# Patient Record
Sex: Female | Born: 1995 | Race: Black or African American | Hispanic: No | Marital: Single | Smoking: Never smoker
Health system: Southern US, Community
[De-identification: ages and names within clinical notes are randomized; demographics above are authoritative.]

## PROBLEM LIST (undated history)

## (undated) DIAGNOSIS — Z789 Other specified health status: Secondary | ICD-10-CM

## (undated) DIAGNOSIS — E119 Type 2 diabetes mellitus without complications: Secondary | ICD-10-CM

## (undated) HISTORY — PX: OTHER SURGICAL HISTORY: SHX169

## (undated) HISTORY — PX: NO PAST SURGERIES: SHX2092

---
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
## 2017-07-10 DIAGNOSIS — L03311 Cellulitis of abdominal wall: Secondary | ICD-10-CM | POA: Insufficient documentation

## 2017-07-10 DIAGNOSIS — L981 Factitial dermatitis: Secondary | ICD-10-CM | POA: Insufficient documentation

## 2017-07-10 NOTE — ED Triage Notes (Signed)
Pt states she developed a small wound to left lower leg and left abd last week that is not healing. States areas do itch and unsure of insect bite.

## 2017-07-11 ENCOUNTER — Encounter: Payer: Self-pay | Admitting: Emergency Medicine

## 2017-07-11 ENCOUNTER — Emergency Department
Admission: EM | Admit: 2017-07-11 | Discharge: 2017-07-11 | Disposition: A | Discharge status: Home / Self Care | Payer: Self-pay | Attending: Emergency Medicine | Admitting: Emergency Medicine

## 2017-07-11 DIAGNOSIS — L03311 Cellulitis of abdominal wall: Secondary | ICD-10-CM

## 2017-07-11 DIAGNOSIS — F424 Excoriation (skin-picking) disorder: Secondary | ICD-10-CM

## 2017-07-11 MED ORDER — CEPHALEXIN 500 MG PO CAPS: 500.0000 mg | ORAL_CAPSULE | Freq: Four times a day (QID) | ORAL | 0 refills | Status: AC

## 2017-07-11 MED ORDER — CEPHALEXIN 500 MG PO CAPS
500.0000 mg | ORAL_CAPSULE | Freq: Once | ORAL | Status: AC
  Administered 2017-07-11: 500 mg via ORAL
  Filled 2017-07-11: qty 1

## 2017-07-11 MED ORDER — BACITRACIN ZINC 500 UNIT/GM EX OINT
TOPICAL_OINTMENT | CUTANEOUS | Status: AC
  Filled 2017-07-11: qty 0.9

## 2017-07-11 MED ORDER — BACITRACIN ZINC 500 UNIT/GM EX OINT: TOPICAL_OINTMENT | CUTANEOUS | 0 refills | Status: AC

## 2017-07-11 MED ORDER — BACITRACIN ZINC 500 UNIT/GM EX OINT
TOPICAL_OINTMENT | Freq: Two times a day (BID) | CUTANEOUS | Status: DC
  Administered 2017-07-11: 1 via TOPICAL

## 2017-07-11 NOTE — ED Provider Notes (Signed)
[facility] Emergency Department Provider Note   ____________________________________________   First MD Initiated Contact with Patient 07/11/17 0345     (approximate)  I have reviewed the triage vital signs and the nursing notes.   HISTORY  Chief Complaint Wound Check    HPI Dominique Sanders is a 21 y.o. female who comes into the hospital today with a wound to her left lower abdomen and her left lower leg. The patient had this since last Tuesday approximately 8 days ago. The patient started itching her leg and she actually scratched these wounds into her leg and abdomen. The patient is unsure exactly when it became worse but noticed that when she was scratching either area she felt some fluid. The patient states that her uncle told her look like it was getting worse in color and worse in size. It was initially small but has gotten bigger. The patient has been putting peroxide on this area daily and then putting Band-Aids on it daily. The patient denies any fevers. She reports that sometimes she feels hot when it is cold but has not had any fevers. It's painful when he touches but she has no pain otherwise. The patient is here today for evaluation of this area. The patient also reports that she is unsure if she has any medical problems and she wants us to check her for medical problems.   History reviewed. No pertinent past medical history.  There are no active problems to display for this patient.   History reviewed. No pertinent surgical history.  Prior to Admission medications   Medication Sig Start Date End Date Taking? Authorizing Provider  bacitracin ointment Apply to affected area daily 07/11/17 07/11/18  Rebecka ApleyWebster, Carrianne Hyun P, MD  cephALEXin (KEFLEX) 500 MG capsule Take 1 capsule (500 mg total) by mouth 4 (four) times daily. 07/11/17 07/21/17  Rebecka ApleyWebster, Kanylah Muench P, MD    Allergies Patient has no known allergies.  No family history on file.  Social  History Social History  Substance Use Topics  . Smoking status: Never Smoker  . Smokeless tobacco: Not on file  . Alcohol use Yes    Review of Systems  Constitutional: No fever/chills Eyes: No visual changes. ENT: No sore throat. Cardiovascular: Denies chest pain. Respiratory: Denies shortness of breath. Gastrointestinal: No abdominal pain.  No nausea, no vomiting.  No diarrhea.  No constipation. Genitourinary: Negative for dysuria. Musculoskeletal: Negative for back pain. Skin: Excoriated area to left shin and left abdominal wall Neurological: Negative for headaches, focal weakness or numbness.   ____________________________________________   PHYSICAL EXAM:  VITAL SIGNS: ED Triage Vitals  Enc Vitals Group     BP 07/10/17 2347 138/78     Pulse Rate 07/10/17 2347 (!) 108     Resp 07/10/17 2347 20     Temp 07/10/17 2347 98.2 F (36.8 C)     Temp Source 07/10/17 2347 Oral     SpO2 07/10/17 2347 100 %     Weight 07/10/17 2351 214 lb (97.1 kg)     Height 07/10/17 2347 5\' 2"  (1.575 m)     Head Circumference --      Peak Flow --      Pain Score 07/10/17 2347 8     Pain Loc --      Pain Edu? --      Excl. in GC? --     Constitutional: Alert and oriented. Well appearing and in Mild distress. Eyes: Conjunctivae are normal. PERRL. EOMI. Head: Atraumatic.  Nose: No congestion/rhinnorhea. Mouth/Throat: Mucous membranes are moist.  Oropharynx non-erythematous. Cardiovascular: Normal rate, regular rhythm. Grossly normal heart sounds.  Good peripheral circulation. Respiratory: Normal respiratory effort.  No retractions. Lungs CTAB. Gastrointestinal: Soft and nontender. No distention. Positive bowel sounds Musculoskeletal: No lower extremity tenderness nor edema.   Neurologic:  Normal speech and language.  Skin:  Skin is warm, dry lesion noted to left shin approximately 5 cm in length and 1 cm in width, no active draining but with some yellow crusting in the middle of the  lesion, lesion also noted to the patient's left abdominal wall with some erythema surrounding again with some crusting in the middle with no active drainage. It is approximately 2-3 cm in length. Psychiatric: Mood and affect are normal.   ____________________________________________   LABS (all labs ordered are listed, but only abnormal results are displayed)  Labs Reviewed - No data to display ____________________________________________  EKG  none ____________________________________________  RADIOLOGY  No results found.  ____________________________________________   PROCEDURES  Procedure(s) performed: None  Procedures  Critical Care performed: No  ____________________________________________   INITIAL IMPRESSION / ASSESSMENT AND PLAN / ED COURSE  Pertinent labs & imaging results that were available during my care of the patient were reviewed by me and considered in my medical decision making (see chart for details).  This is a 22 year old female who comes into the hospital today with 2 wounds to her leg and abdominal wall. The patient scratched these areas and they have been getting bigger and bigger. The patient has been using peroxide daily which can be cytotoxic and that can be causing the worsening of her wound. As there is an area of erythema and warmth I will give the patient dose of Keflex. I will also place some bacitracin on the wound and wrapped them. I informed the patient and she should just placed some bacitracin on the wounds and let them. She is to change the dressing approximately every 2 days. I also informed the patient that I cannot check her for routine illnesses. If she wants a physical exam she needs to [facility] or with the acute care clinic for further evaluation. Otherwise the patient has no further concerns or complaints. Her vital signs are unremarkable. She'll be discharged home to follow-up.       ____________________________________________   FINAL CLINICAL IMPRESSION(S) / ED DIAGNOSES  Final diagnoses:  Cellulitis of abdominal wall  Excoriation (skin-picking) disorder      NEW MEDICATIONS STARTED DURING THIS VISIT:  Discharge Medication List as of 07/11/2017  4:21 AM    START taking these medications   Details  bacitracin ointment Apply to affected area daily, Print    cephALEXin (KEFLEX) 500 MG capsule Take 1 capsule (500 mg total) by mouth 4 (four) times daily., Starting Thu 07/11/2017, Until Sun 07/21/2017, Print         Note:  This document was prepared using Dragon voice recognition software and may include unintentional dictation errors.    Rebecka Apley, MD 07/11/17 [phone]

## 2017-07-16 ENCOUNTER — Emergency Department (HOSPITAL_COMMUNITY): Payer: Self-pay

## 2017-07-16 ENCOUNTER — Emergency Department: Payer: No Typology Code available for payment source

## 2017-07-16 ENCOUNTER — Encounter: Payer: Self-pay | Admitting: *Deleted

## 2017-07-16 DIAGNOSIS — Y939 Activity, unspecified: Secondary | ICD-10-CM | POA: Diagnosis not present

## 2017-07-16 DIAGNOSIS — Y998 Other external cause status: Secondary | ICD-10-CM | POA: Diagnosis not present

## 2017-07-16 DIAGNOSIS — Y929 Unspecified place or not applicable: Secondary | ICD-10-CM | POA: Diagnosis not present

## 2017-07-16 DIAGNOSIS — R55 Syncope and collapse: Secondary | ICD-10-CM | POA: Diagnosis not present

## 2017-07-16 DIAGNOSIS — Z043 Encounter for examination and observation following other accident: Secondary | ICD-10-CM | POA: Insufficient documentation

## 2017-07-16 NOTE — ED Notes (Signed)
Spoke to Dr. Manson PasseyBrown, verbal order for head CT and C spine.
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
## 2017-07-17 ENCOUNTER — Emergency Department
Admission: EM | Admit: 2017-07-17 | Discharge: 2017-07-17 | Disposition: A | Discharge status: Home / Self Care | Payer: No Typology Code available for payment source | Attending: Emergency Medicine | Admitting: Emergency Medicine

## 2017-07-17 ENCOUNTER — Encounter: Payer: Self-pay | Admitting: Emergency Medicine
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
## 2018-11-06 ENCOUNTER — Emergency Department
Admission: EM | Admit: 2018-11-06 | Discharge: 2018-11-06 | Disposition: A | Discharge status: Home / Self Care | Payer: Self-pay | Attending: Emergency Medicine | Admitting: Emergency Medicine

## 2018-11-06 ENCOUNTER — Emergency Department: Payer: Self-pay

## 2018-11-06 ENCOUNTER — Encounter: Payer: Self-pay | Admitting: Emergency Medicine

## 2018-11-06 DIAGNOSIS — M79641 Pain in right hand: Secondary | ICD-10-CM | POA: Insufficient documentation

## 2018-11-06 DIAGNOSIS — R700 Elevated erythrocyte sedimentation rate: Secondary | ICD-10-CM | POA: Insufficient documentation

## 2018-11-06 LAB — SEDIMENTATION RATE: Sed Rate: 22 mm/hr — ABNORMAL HIGH (ref 0–20)

## 2018-11-06 LAB — URIC ACID: URIC ACID, SERUM: 6.5 mg/dL (ref 2.5–7.1)

## 2018-11-06 MED ORDER — MELOXICAM 15 MG PO TABS: 15.0000 mg | ORAL_TABLET | Freq: Every day | ORAL | 2 refills | Status: DC

## 2018-11-06 NOTE — ED Triage Notes (Signed)
Patient presents to the ED with intermittent right hand swelling and pain x 1 week.  Patient denies any known injury to right hand.  Patient placed compression band on hand yesterday and states this has increased her pain.  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 Patient is right handed.  No obvious distress at this time.

## 2018-11-06 NOTE — Discharge Instructions (Addendum)
Follow-up with your regular doctor or the [facility].  You will need additional labs to define whether you have a rheumatoid arthritis type illness.  Apply ice to the hand as needed.  Take the medication as prescribed.

## 2018-11-06 NOTE — ED Provider Notes (Signed)
[facility] Emergency Department Provider Note  ____________________________________________   First MD Initiated Contact with Patient 11/06/18 1715     (approximate)  I have reviewed the triage vital signs and the nursing notes.   HISTORY  Chief Complaint Hand Pain    HPI Dominique Sanders is a 22 y.o. female presents emergency department complaining of right hand swelling intermittently.  Symptoms on and off for 1 week.  She denies any injury to the right hand.  She put a compression on the hand yesterday which increased pain.  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  She denies any numbness or tingling.  She denies any IV drug use.    History reviewed. No pertinent past medical history.  There are no active problems to display for this patient.   History reviewed. No pertinent surgical history.  Prior to Admission medications   Medication Sig Start Date End Date Taking? Authorizing Provider  meloxicam (MOBIC) 15 MG tablet Take 1 tablet (15 mg total) by mouth daily. 11/06/18   Faythe Ghee, PA-C    Allergies Patient has no known allergies.  No family history on file.  Social History Social History   Tobacco Use  . Smoking status: Never Smoker  . Smokeless tobacco: Never Used  Substance Use Topics  . Alcohol use: Yes  . Drug use: No    Review of Systems  Constitutional: No fever/chills Eyes: No visual changes. ENT: No sore throat. Respiratory: Denies cough Genitourinary: Negative for dysuria. Musculoskeletal: Negative for back pain.  Positive for right hand swelling Skin: Negative for rash.    ____________________________________________   PHYSICAL EXAM:  VITAL SIGNS: ED Triage Vitals [11/06/18 1627]  Enc Vitals Group     BP 105/76     Pulse Rate 87     Resp 18     Temp 98.7 F (37.1 C)     Temp Source Oral     SpO2 98 %     Weight 223 lb 1.7 oz (101.2 kg)     Height 5'  2" (1.575 m)     Head Circumference      Peak Flow      Pain Score 7     Pain Loc      Pain Edu?      Excl. in GC?     Constitutional: Alert and oriented. Well appearing and in no acute distress. Eyes: Conjunctivae are normal.  Head: Atraumatic. Nose: No congestion/rhinnorhea. Mouth/Throat: Mucous membranes are moist.   Neck:  supple no lymphadenopathy noted Cardiovascular: Normal rate, regular rhythm. Heart sounds are normal Respiratory: Normal respiratory effort.  No retractions, lungs c t a  GU: deferred Musculoskeletal: FROM all extremities, warm and well perfused.  Right hand appears to be swollen across the metacarpals and the fingers appear to be minimally swollen.  Neurovascular is intact.  No redness is noted. Neurologic:  Normal speech and language.  Skin:  Skin is warm, dry and intact. No rash noted. Psychiatric: Mood and affect are normal. Speech and behavior are normal.  ____________________________________________   LABS (all labs ordered are listed, but only abnormal results are displayed)  Labs Reviewed  SEDIMENTATION RATE - Abnormal; Notable for the following components:      Result Value   Sed Rate 22 (*)    All other components within normal limits  URIC ACID   ____________________________________________   ____________________________________________  RADIOLOGY  X-ray of the right hand  is negative  ____________________________________________   PROCEDURES  Procedure(s) performed: No  Procedures    ____________________________________________   INITIAL IMPRESSION / ASSESSMENT AND PLAN / ED COURSE  Pertinent labs & imaging results that were available during my care of the patient were reviewed by me and considered in my medical decision making (see chart for details).   Patient is a 22 year old female presents emergency department complaining of right hand swelling.  She states it is been ongoing for 1 week intermittently.  She denies  any injury to the hand.  She denies any IV drug use.  On physical exam the hand is slightly tender with some puffiness and swelling noted over the metacarpals and into the fingers.  Neurovascular is intact.  No redness or track marks are noted.  Sed rate is elevated, uric acid is negative.  X-ray of the right hand is negative.  The patient was notified of the results.  She was given a prescription for meloxicam.  She is to follow-up with either her regular doctor or the [facility] for additional testing to define if it is rheumatoid arthritis or not.  She states she understands will comply.  She was discharged in stable condition.  As part of my medical decision making, I reviewed the following data within the electronic MEDICAL RECORD NUMBER Nursing notes reviewed and incorporated, Labs reviewed uric acid negative, sed rate elevated, Old chart reviewed, Radiograph reviewed x-ray of the right hand is negative, Notes from prior ED visits and [place] Controlled Substance Database  ____________________________________________   FINAL CLINICAL IMPRESSION(S) / ED DIAGNOSES  Final diagnoses:  Right hand pain  Elevated sed rate      NEW MEDICATIONS STARTED DURING THIS VISIT:  New Prescriptions   MELOXICAM (MOBIC) 15 MG TABLET    Take 1 tablet (15 mg total) by mouth daily.     Note:  This document was prepared using Dragon voice recognition software and may include unintentional dictation errors.    Faythe GheeFisher,  W, PA-C 11/06/18 2007    Pershing ProudSchaevitz, Myra Rudeavid Matthew, MD 11/06/18 [phone]

## 2019-07-30 ENCOUNTER — Other Ambulatory Visit: Payer: Self-pay

## 2019-07-30 ENCOUNTER — Ambulatory Visit (INDEPENDENT_AMBULATORY_CARE_PROVIDER_SITE_OTHER): Payer: Medicaid Other | Admitting: Certified Nurse Midwife

## 2019-07-30 VITALS — BP 96/68 | HR 82 | Ht 62.0 in | Wt 262.0 lb

## 2019-07-30 DIAGNOSIS — Z3490 Encounter for supervision of normal pregnancy, unspecified, unspecified trimester: Secondary | ICD-10-CM | POA: Diagnosis not present

## 2019-07-30 DIAGNOSIS — Z0283 Encounter for blood-alcohol and blood-drug test: Secondary | ICD-10-CM

## 2019-07-30 DIAGNOSIS — R638 Other symptoms and signs concerning food and fluid intake: Secondary | ICD-10-CM

## 2019-07-30 DIAGNOSIS — Z113 Encounter for screening for infections with a predominantly sexual mode of transmission: Secondary | ICD-10-CM

## 2019-07-30 LAB — POCT URINE PREGNANCY: Preg Test, Ur: POSITIVE — AB

## 2019-07-30 MED ORDER — PROMETHAZINE HCL 25 MG PO TABS: 25.0000 mg | ORAL_TABLET | Freq: Four times a day (QID) | ORAL | 6 refills | Status: DC | PRN

## 2019-07-30 MED ORDER — CITRANATAL BLOOM 90-1 MG PO TABS: 1.0000 mg | ORAL_TABLET | Freq: Every day | ORAL | 12 refills | Status: DC

## 2019-07-30 NOTE — Patient Instructions (Signed)
WHAT OB PATIENTS CAN EXPECT   Confirmation of pregnancy and ultrasound ordered if medically indicated-[redacted] weeks gestation  New OB (NOB) intake with nurse and New OB (NOB) labs- [redacted] weeks gestation  New OB (NOB) physical examination with provider- 11/[redacted] weeks gestation  Flu vaccine-[redacted] weeks gestation  Anatomy scan-[redacted] weeks gestation  Glucose tolerance test, blood work to test for anemia, T-dap vaccine-[redacted] weeks gestation  Vaginal swabs/cultures-STD/Group B strep-[redacted] weeks gestation  Appointments every 4 weeks until 28 weeks  Every 2 weeks from 28 weeks until 36 weeks  Weekly visits from 36 weeks until delivery  Prenatal Care Prenatal care is health care during pregnancy. It helps you and your unborn baby (fetus) stay as healthy as possible. Prenatal care may be provided by a midwife, a family practice health care provider, or a childbirth and pregnancy specialist (obstetrician). How does this affect me? During pregnancy, you will be closely monitored for any new conditions that might develop. To lower your risk of pregnancy complications, you and your health care provider will talk about any underlying conditions you have. How does this affect my baby? Early and consistent prenatal care increases the chance that your baby will be healthy during pregnancy. Prenatal care lowers the risk that your baby will be:  Born early (prematurely).  Smaller than expected at birth (small for gestational age). What can I expect at the first prenatal care visit? Your first prenatal care visit will likely be the longest. You should schedule your first prenatal care visit as soon as you know that you are pregnant. Your first visit is a good time to talk about any questions or concerns you have about pregnancy. At your visit, you and your health care provider will talk about:  Your medical history, including: ? Any past pregnancies. ? Your family's medical history. ? The baby's father's medical  history. ? Any long-term (chronic) health conditions you have and how you manage them. ? Any surgeries or procedures you have had. ? Any current over-the-counter or prescription medicines, herbs, or supplements you are taking.  Other factors that could pose a risk to your baby, including:  Your home setting and your stress levels, including: ? Exposure to abuse or violence. ? Household financial strain. ? Mental health conditions you have.  Your daily health habits, including diet and exercise. Your health care provider will also:  Measure your weight, height, and blood pressure.  Do a physical exam, including a pelvic and breast exam.  Perform blood tests and urine tests to check for: ? Urinary tract infection. ? Sexually transmitted infections (STIs). ? Low iron levels in your blood (anemia). ? Blood type and certain proteins on red blood cells (Rh antibodies). ? Infections and immunity to viruses, such as hepatitis B and rubella. ? HIV (human immunodeficiency virus).  Do an ultrasound to confirm your baby's growth and development and to help predict your estimated due date (EDD). This ultrasound is done with a probe that is inserted into the vagina (transvaginal ultrasound).  Discuss your options for genetic screening.  Give you information about how to keep yourself and your baby healthy, including: ? Nutrition and taking vitamins. ? Physical activity. ? How to manage pregnancy symptoms such as nausea and vomiting (morning sickness). ? Infections and substances that may be harmful to your baby and how to avoid them. ? Food safety. ? Dental care. ? Working. ? Travel. ? Warning signs to watch for and when to call your health care provider. How often will  I have prenatal care visits? After your first prenatal care visit, you will have regular visits throughout your pregnancy. The visit schedule is often as follows:  Up to week 28 of pregnancy: once every 4 weeks.  28-36  weeks: once every 2 weeks.  After 36 weeks: every week until delivery. Some women may have visits more or less often depending on any underlying health conditions and the health of the baby. Keep all follow-up and prenatal care visits as told by your health care provider. This is important. What happens during routine prenatal care visits? Your health care provider will:  Measure your weight and blood pressure.  Check for fetal heart sounds.  Measure the height of your uterus in your abdomen (fundal height). This may be measured starting around week 20 of pregnancy.  Check the position of your baby inside your uterus.  Ask questions about your diet, sleeping patterns, and whether you can feel the baby move.  Review warning signs to watch for and signs of labor.  Ask about any pregnancy symptoms you are having and how you are dealing with them. Symptoms may include: ? Headaches. ? Nausea and vomiting. ? Vaginal discharge. ? Swelling. ? Fatigue. ? Constipation. ? Any discomfort, including back or pelvic pain. Make a list of questions to ask your health care provider at your routine visits. What tests might I have during prenatal care visits? You may have blood, urine, and imaging tests throughout your pregnancy, such as:  Urine tests to check for glucose, protein, or signs of infection.  Glucose tests to check for a form of diabetes that can develop during pregnancy (gestational diabetes mellitus). This is usually done around week 24 of pregnancy.  An ultrasound to check your baby's growth and development and to check for birth defects. This is usually done around week 20 of pregnancy.  A test to check for group B strep (GBS) infection. This is usually done around week 36 of pregnancy.  Genetic testing. This may include blood or imaging tests, such as an ultrasound. Some genetic tests are done during the first trimester and some are done during the second trimester. What else  can I expect during prenatal care visits? Your health care provider may recommend getting certain vaccines during pregnancy. These may include:  A yearly flu shot (annual influenza vaccine). This is especially important if you will be pregnant during flu season.  Tdap (tetanus, diphtheria, pertussis) vaccine. Getting this vaccine during pregnancy can protect your baby from whooping cough (pertussis) after birth. This vaccine may be recommended between weeks 27 and 36 of pregnancy. Later in your pregnancy, your health care provider may give you information about:  Childbirth and breastfeeding classes.  Choosing a health care provider for your baby.  Umbilical cord banking.  Breastfeeding.  Birth control after your baby is born.  The hospital labor and delivery unit and how to tour it.  Registering at the hospital before you go into labor. Where to find more information  Office on Women's Health: LegalWarrants.gl  American Pregnancy Association: americanpregnancy.org  March of Dimes: marchofdimes.org Summary  Prenatal care helps you and your baby stay as healthy as possible during pregnancy.  Your first prenatal care visit will most likely be the longest.  You will have visits and tests throughout your pregnancy to monitor your health and your baby's health.  Bring a list of questions to your visits to ask your health care provider.  Make sure to keep all follow-up and prenatal  care visits with your health care provider. This information is not intended to replace advice given to you by your health care provider. Make sure you discuss any questions you have with your health care provider. Document Released: 12/13/2003 Document Revised: 04/01/2019 Document Reviewed: 12/09/2017 Elsevier Patient Education  2020 [employer]. How a Baby Grows During Pregnancy  Pregnancy begins when a female's sperm enters a female's egg (fertilization). Fertilization usually happens in one of the  tubes (fallopian tubes) that connect the ovaries to the womb (uterus). The fertilized egg moves down the fallopian tube to the uterus. Once it reaches the uterus, it implants into the lining of the uterus and begins to grow. For the first 10 weeks, the fertilized egg is called an embryo. After 10 weeks, it is called a fetus. As the fetus continues to grow, it receives oxygen and nutrients through tissue (placenta) that grows to support the developing baby. The placenta is the life support system for the baby. It provides oxygen and nutrition and removes waste. Learning as much as you can about your pregnancy and how your baby is developing can help you enjoy the experience. It can also make you aware of when there might be a problem and when to ask questions. How long does a typical pregnancy last? A pregnancy usually lasts 280 days, or about 40 weeks. Pregnancy is divided into three periods of growth, also called trimesters:  First trimester: 0-12 weeks.  Second trimester: 13-27 weeks.  Third trimester: 28-40 weeks. The day when your baby is ready to be born (full term) is your estimated date of delivery. How does my baby develop month by month? First month  The fertilized egg attaches to the inside of the uterus.  Some cells will form the placenta. Others will form the fetus.  The arms, legs, brain, spinal cord, lungs, and heart begin to develop.  At the end of the first month, the heart begins to beat. Second month  The bones, inner ear, eyelids, hands, and feet form.  The genitals develop.  By the end of 8 weeks, all major organs are developing. Third month  All of the internal organs are forming.  Teeth develop below the gums.  Bones and muscles begin to grow. The spine can flex.  The skin is transparent.  Fingernails and toenails begin to form.  Arms and legs continue to grow longer, and hands and feet develop.  The fetus is about 3 inches (7.6 cm) long. Fourth  month  The placenta is completely formed.  The external sex organs, neck, outer ear, eyebrows, eyelids, and fingernails are formed.  The fetus can hear, swallow, and move its arms and legs.  The kidneys begin to produce urine.  The skin is covered with a white, waxy coating (vernix) and very fine hair (lanugo). Fifth month  The fetus moves around more and can be felt for the first time (quickening).  The fetus starts to sleep and wake up and may begin to suck its finger.  The nails grow to the end of the fingers.  The organ in the digestive system that makes bile (gallbladder) functions and helps to digest nutrients.  If your baby is a girl, eggs are present in her ovaries. If your baby is a boy, testicles start to move down into his scrotum. Sixth month  The lungs are formed.  The eyes open. The brain continues to develop.  Your baby has fingerprints and toe prints. Your baby's hair grows thicker.  At the end of the second trimester, the fetus is about 9 inches (22.9 cm) long. Seventh month  The fetus kicks and stretches.  The eyes are developed enough to sense changes in light.  The hands can make a grasping motion.  The fetus responds to sound. Eighth month  All organs and body systems are fully developed and functioning.  Bones harden, and taste buds develop. The fetus may hiccup.  Certain areas of the brain are still developing. The skull remains soft. Ninth month  The fetus gains about  lb (0.23 kg) each week.  The lungs are fully developed.  Patterns of sleep develop.  The fetus's head typically moves into a head-down position (vertex) in the uterus to prepare for birth.  The fetus weighs 6-9 lb (2.72-4.08 kg) and is 19-20 inches (48.26-50.8 cm) long. What can I do to have a healthy pregnancy and help my baby develop? General instructions  Take prenatal vitamins as directed by your health care provider. These include vitamins such as folic acid,  iron, calcium, and vitamin D. They are important for healthy development.  Take medicines only as directed by your health care provider. Read labels and ask a pharmacist or your health care provider whether over-the-counter medicines, supplements, and prescription drugs are safe to take during pregnancy.  Keep all follow-up visits as directed by your health care provider. This is important. Follow-up visits include prenatal care and screening tests. How do I know if my baby is developing well? At each prenatal visit, your health care provider will do several different tests to check on your health and keep track of your baby's development. These include:  Fundal height and position. ? Your health care provider will measure your growing belly from your pubic bone to the top of the uterus using a tape measure. ? Your health care provider will also feel your belly to determine your baby's position.  Heartbeat. ? An ultrasound in the first trimester can confirm pregnancy and show a heartbeat, depending on how far along you are. ? Your health care provider will check your baby's heart rate at every prenatal visit.  Second trimester ultrasound. ? This ultrasound checks your baby's development. It also may show your baby's gender. What should I do if I have concerns about my baby's development? Always talk with your health care provider about any concerns that you may have about your pregnancy and your baby. Summary  A pregnancy usually lasts 280 days, or about 40 weeks. Pregnancy is divided into three periods of growth, also called trimesters.  Your health care provider will monitor your baby's growth and development throughout your pregnancy.  Follow your health care provider's recommendations about taking prenatal vitamins and medicines during your pregnancy.  Talk with your health care provider if you have any concerns about your pregnancy or your developing baby. This information is not  intended to replace advice given to you by your health care provider. Make sure you discuss any questions you have with your health care provider. Document Released: 05/28/2008 Document Revised: 04/02/2019 Document Reviewed: 10/23/2017 Elsevier Patient Education  [place]. Morning Sickness  Morning sickness is when you feel sick to your stomach (nauseous) during pregnancy. You may feel sick to your stomach and throw up (vomit). You may feel sick in the morning, but you can feel this way at any time of day. Some women feel very sick to their stomach and cannot stop throwing up (hyperemesis gravidarum). Follow these instructions at home:  Medicines  Take over-the-counter and prescription medicines only as told by your doctor. Do not take any medicines until you talk with your doctor about them first.  Taking multivitamins before getting pregnant can stop or lessen the harshness of morning sickness. Eating and drinking  Eat dry toast or crackers before getting out of bed.  Eat 5 or 6 small meals a day.  Eat dry and bland foods like rice and baked potatoes.  Do not eat greasy, fatty, or spicy foods.  Have someone cook for you if the smell of food causes you to feel sick or throw up.  If you feel sick to your stomach after taking prenatal vitamins, take them at night or with a snack.  Eat protein when you need a snack. Nuts, yogurt, and cheese are good choices.  Drink fluids throughout the day.  Try ginger ale made with real ginger, ginger tea made from fresh grated ginger, or ginger candies. General instructions  Do not use any products that have nicotine or tobacco in them, such as cigarettes and e-cigarettes. If you need help quitting, ask your doctor.  Use an air purifier to keep the air in your house free of smells.  Get lots of fresh air.  Try to avoid smells that make you feel sick.  Try: ? Wearing a bracelet that is used for seasickness (acupressure  wristband). ? Going to a doctor who puts thin needles into certain body points (acupuncture) to improve how you feel. Contact a doctor if:  You need medicine to feel better.  You feel dizzy or light-headed.  You are losing weight. Get help right away if:  You feel very sick to your stomach and cannot stop throwing up.  You pass out (faint).  You have very bad pain in your belly. Summary  Morning sickness is when you feel sick to your stomach (nauseous) during pregnancy.  You may feel sick in the morning, but you can feel this way at any time of day.  Making some changes to what you eat may help your symptoms go away. This information is not intended to replace advice given to you by your health care provider. Make sure you discuss any questions you have with your health care provider. Document Released: 01/17/2005 Document Revised: 11/22/2017 Document Reviewed: 01/10/2017 Elsevier Patient Education  2020 [place] of Pregnancy  The first trimester of pregnancy is from week 1 until the end of week 13 (months 1 through 3). During this time, your baby will begin to develop inside you. At 6-8 weeks, the eyes and face are formed, and the heartbeat can be seen on ultrasound. At the end of 12 weeks, all the baby's organs are formed. Prenatal care is all the medical care you receive before the birth of your baby. Make sure you get good prenatal care and follow all of your doctor's instructions. Follow these instructions at home: Medicines  Take over-the-counter and prescription medicines only as told by your doctor. Some medicines are safe and some medicines are not safe during pregnancy.  Take a prenatal vitamin that contains at least 600 micrograms (mcg) of folic acid.  If you have trouble pooping (constipation), take medicine that will make your stool soft (stool softener) if your doctor approves. Eating and drinking   Eat regular, healthy meals.  Your doctor  will tell you the amount of weight gain that is right for you.  Avoid raw meat and uncooked cheese.  If you feel sick to  your stomach (nauseous) or throw up (vomit): ? Eat 4 or 5 small meals a day instead of 3 large meals. ? Try eating a few soda crackers. ? Drink liquids between meals instead of during meals.  To prevent constipation: ? Eat foods that are high in fiber, like fresh fruits and vegetables, whole grains, and beans. ? Drink enough fluids to keep your pee (urine) clear or pale yellow. Activity  Exercise only as told by your doctor. Stop exercising if you have cramps or pain in your lower belly (abdomen) or low back.  Do not exercise if it is too hot, too humid, or if you are in a place of great height (high altitude).  Try to avoid standing for long periods of time. Move your legs often if you must stand in one place for a long time.  Avoid heavy lifting.  Wear low-heeled shoes. Sit and stand up straight.  You can have sex unless your doctor tells you not to. Relieving pain and discomfort  Wear a good support bra if your breasts are sore.  Take warm water baths (sitz baths) to soothe pain or discomfort caused by hemorrhoids. Use hemorrhoid cream if your doctor says it is okay.  Rest with your legs raised if you have leg cramps or low back pain.  If you have puffy, bulging veins (varicose veins) in your legs: ? Wear support hose or compression stockings as told by your doctor. ? Raise (elevate) your feet for 15 minutes, 3-4 times a day. ? Limit salt in your food. Prenatal care  Schedule your prenatal visits by the twelfth week of pregnancy.  Write down your questions. Take them to your prenatal visits.  Keep all your prenatal visits as told by your doctor. This is important. Safety  Wear your seat belt at all times when driving.  Make a list of emergency phone numbers. The list should include numbers for family, friends, the hospital, and police and fire  departments. General instructions  Ask your doctor for a referral to a local prenatal class. Begin classes no later than at the start of month 6 of your pregnancy.  Ask for help if you need counseling or if you need help with nutrition. Your doctor can give you advice or tell you where to go for help.  Do not use hot tubs, steam rooms, or saunas.  Do not douche or use tampons or scented sanitary pads.  Do not cross your legs for long periods of time.  Avoid all herbs and alcohol. Avoid drugs that are not approved by your doctor.  Do not use any tobacco products, including cigarettes, chewing tobacco, and electronic cigarettes. If you need help quitting, ask your doctor. You may get counseling or other support to help you quit.  Avoid cat litter boxes and soil used by cats. These carry germs that can cause birth defects in the baby and can cause a loss of your baby (miscarriage) or stillbirth.  Visit your dentist. At home, brush your teeth with a soft toothbrush. Be gentle when you floss. Contact a doctor if:  You are dizzy.  You have mild cramps or pressure in your lower belly.  You have a nagging pain in your belly area.  You continue to feel sick to your stomach, you throw up, or you have watery poop (diarrhea).  You have a bad smelling fluid coming from your vagina.  You have pain when you pee (urinate).  You have increased puffiness (swelling) in your  face, hands, legs, or ankles. Get help right away if:  You have a fever.  You are leaking fluid from your vagina.  You have spotting or bleeding from your vagina.  You have very bad belly cramping or pain.  You gain or lose weight rapidly.  You throw up blood. It may look like coffee grounds.  You are around people who have [place] measles, fifth disease, or chickenpox.  You have a very bad headache.  You have shortness of breath.  You have any kind of trauma, such as from a fall or a car  accident. Summary  The first trimester of pregnancy is from week 1 until the end of week 13 (months 1 through 3).  To take care of yourself and your unborn baby, you will need to eat healthy meals, take medicines only if your doctor tells you to do so, and do activities that are safe for you and your baby.  Keep all follow-up visits as told by your doctor. This is important as your doctor will have to ensure that your baby is healthy and growing well. This information is not intended to replace advice given to you by your health care provider. Make sure you discuss any questions you have with your health care provider. Document Released: 05/28/2008 Document Revised: 04/02/2019 Document Reviewed: 12/18/2016 Elsevier Patient Education  2020 [employer]. Commonly Asked Questions During Pregnancy  Cats: A parasite can be excreted in cat feces.  To avoid exposure you need to have another person empty the little box.  If you must empty the litter box you will need to wear gloves.  Wash your hands after handling your cat.  This parasite can also be found in raw or undercooked meat so this should also be avoided.  Colds, Sore Throats, Flu: Please check your medication sheet to see what you can take for symptoms.  If your symptoms are unrelieved by these medications please call the office.  Dental Work: Most any dental work [employer] recommends is permitted.  X-rays should only be taken during the first trimester if absolutely necessary.  Your abdomen should be shielded with a lead apron during all x-rays.  Please notify your provider prior to receiving any x-rays.  Novocaine is fine; gas is not recommended.  If your dentist requires a note from [place] prior to dental work please call the office and we will provide one for you.  Exercise: Exercise is an important part of staying healthy during your pregnancy.  You may continue most exercises you were accustomed to prior to pregnancy.  Later in your  pregnancy you will most likely notice you have difficulty with activities requiring balance like riding a bicycle.  It is important that you listen to your body and avoid activities that put you at a higher risk of falling.  Adequate rest and staying well hydrated are a must!  If you have questions about the safety of specific activities ask your provider.    Exposure to Children with illness: Try to avoid obvious exposure; report any symptoms to [place] when noted,  If you have chicken pos, red measles or mumps, you should be immune to these diseases.   Please do not take any vaccines while pregnant unless you have checked with your OB provider.  Fetal Movement: After 28 weeks we recommend you do "kick counts" twice daily.  Lie or sit down in a calm quiet environment and count your baby movements "kicks".  You should feel your baby  should feel your baby at least 10 times per hour.  If you have not felt 10 kicks within the first hour get up, walk around and have something sweet to eat or drink then repeat for an additional hour.  If count remains less than 10 per hour notify your provider.  Fumigating: Follow your pest control agent's advice as to how long to stay out of your home.  Ventilate the area well before re-entering.  Hemorrhoids:   Most over-the-counter preparations can be used during pregnancy.  Check your medication to see what is safe to use.  It is important to use a stool softener or fiber in your diet and to drink lots of liquids.  If hemorrhoids seem to be getting worse please call the office.   Hot Tubs:  Hot tubs Jacuzzis and saunas are not recommended while pregnant.  These increase your internal body temperature and should be avoided.  Intercourse:  Sexual intercourse is safe during pregnancy as long as you are comfortable, unless otherwise advised by your provider.  Spotting may occur after intercourse; report any bright red bleeding that is heavier than spotting.  Labor:  If you know that you are in labor,  please go to the hospital.  If you are unsure, please call the office and let us help you decide what to do.  Lifting, straining, etc:  If your job requires heavy lifting or straining please check with your provider for any limitations.  Generally, you should not lift items heavier than that you can lift simply with your hands and arms (no back muscles)  Painting:  Paint fumes do not harm your pregnancy, but may make you ill and should be avoided if possible.  Latex or water based paints have less odor than oils.  Use adequate ventilation while painting.  Permanents & Hair Color:  Chemicals in hair dyes are not recommended as they cause increase hair dryness which can increase hair loss during pregnancy.  " Highlighting" and permanents are allowed.  Dye may be absorbed differently and permanents may not hold as well during pregnancy.  Sunbathing:  Use a sunscreen, as skin burns easily during pregnancy.  Drink plenty of fluids; avoid over heating.  Tanning Beds:  Because their possible side effects are still unknown, tanning beds are not recommended.  Ultrasound Scans:  Routine ultrasounds are performed at approximately 20 weeks.  You will be able to see your baby's general anatomy an if you would like to know the gender this can usually be determined as well.  If it is questionable when you conceived you may also receive an ultrasound early in your pregnancy for dating purposes.  Otherwise ultrasound exams are not routinely performed unless there is a medical necessity.  Although you can request a scan we ask that you pay for it when conducted because insurance does not cover " patient request" scans.  Work: If your pregnancy proceeds without complications you may work until your due date, unless your physician or employer advises otherwise.  Round Ligament Pain/Pelvic Discomfort:  Sharp, shooting pains not associated with bleeding are fairly common, usually occurring in the second trimester of  pregnancy.  They tend to be worse when standing up or when you remain standing for long periods of time.  These are the result of pressure of certain pelvic ligaments called "round ligaments".  Rest, Tylenol and heat seem to be the most effective relief.  As the womb and fetus grow, they rise out of the pelvis   Please notify the office if your pain seems different than that described.  It may represent a more serious condition.  Common Medications Safe in Pregnancy  Acne:      Constipation:  Benzoyl Peroxide     Colace  Clindamycin      Dulcolax Suppository  Topica Erythromycin     Fibercon  Salicylic Acid      Metamucil         Miralax AVOID:        Senakot   Accutane    Cough:  Retin-A       Cough Drops  Tetracycline      Phenergan w/ Codeine if Rx  Minocycline      Robitussin (Plain & DM)  Antibiotics:     Crabs/Lice:  Ceclor       RID  Cephalosporins    AVOID:  E-Mycins      Kwell  Keflex  Macrobid/Macrodantin   Diarrhea:  Penicillin      Kao-Pectate  Zithromax      Imodium AD         PUSH FLUIDS AVOID:       Cipro     Fever:  Tetracycline      Tylenol (Regular or Extra  Minocycline       Strength)  Levaquin      Extra Strength-Do not          Exceed 8 tabs/24 hrs Caffeine:        <258m/day (equiv. To 1 cup of coffee or  approx. 3 12 oz sodas)         Gas: Cold/Hayfever:       Gas-X  Benadryl      Mylicon  Claritin       Phazyme  **Claritin-D        Chlor-Trimeton    Headaches:  Dimetapp      ASA-Free Excedrin  Drixoral-Non-Drowsy     Cold Compress  Mucinex (Guaifenasin)     Tylenol (Regular or Extra  Sudafed/Sudafed-12 Hour     Strength)  **Sudafed PE Pseudoephedrine   Tylenol Cold & Sinus     Vicks Vapor Rub  Zyrtec  **AVOID if Problems With Blood Pressure         Heartburn: Avoid lying down for at least 1 hour after meals  Aciphex      Maalox     Rash:  Milk of Magnesia     Benadryl    Mylanta       1% Hydrocortisone  Cream  Pepcid  Pepcid Complete   Sleep Aids:  Prevacid      Ambien   Prilosec       Benadryl  Rolaids       Chamomile Tea  Tums (Limit 4/day)     Unisom  Zantac       Tylenol PM         Warm milk-add vanilla or  Hemorrhoids:       Sugar for taste  Anusol/Anusol H.C.  (RX: Analapram 2.5%)  Sugar Substitutes:  Hydrocortisone OTC     Ok in moderation  Preparation H      Tucks        Vaseline lotion applied to tissue with wiping    Herpes:     Throat:  Acyclovir      Oragel  Famvir  Valtrex     Vaccines:         Flu Shot Leg Cramps:       *Gardasil  Benadryl      Hepatitis A         Hepatitis B Nasal Spray:       Pneumovax  Saline Nasal Spray     Polio Booster         Tetanus Nausea:       Tuberculosis test or PPD  Vitamin B6 25 mg TID   AVOID:    Dramamine      *Gardasil  Emetrol       Live Poliovirus  Ginger Root 250 mg QID    MMR (measles, mumps &  High Complex Carbs @ Bedtime    rebella)  Sea Bands-Accupressure    Varicella (Chickenpox)  Unisom 1/2 tab TID     *No known complications           If received before Pain:         Known pregnancy;   Darvocet       Resume series after  Lortab        Delivery  Percocet    Yeast:   Tramadol      Femstat  Tylenol 3      Gyne-lotrimin  Ultram       Monistat  Vicodin           MISC:         All Sunscreens           Hair Coloring/highlights          Insect Repellant's          (Including DEET)         Mystic Tans

## 2019-07-30 NOTE — Progress Notes (Signed)
      Dominique Sanders presents for NOB nurse intake visit. Pregnancy confirmation done at [place], with who unknown.  G1.  P0.  LMP unknown.  EDD 02/16/20.  Ga [redacted]w[redacted]d. Pregnancy education material explained and given.  0 cats in the home.  NOB labs ordered. BMI greater than 30. TSH/HbgA1c ordered. Sickle cell order due to race. HIV and drug screen explained and ordered. Genetic screening discussed. Genetic testing; Unsure. Pt to discuss genetic testing with provider. PNV encouraged. Pt to follow up with provider in 1 weeks for NOB physical.    Did UPT in office results positive.  U/S ordered today and completed. Pt is  [redacted]w[redacted]d. EDD 02/16/20  NOB labs not completed due to no lab tech in office. All NOB labs pended.

## 2019-08-04 ENCOUNTER — Encounter: Payer: Self-pay | Admitting: Certified Nurse Midwife

## 2019-08-11 ENCOUNTER — Encounter: Payer: Self-pay | Admitting: Certified Nurse Midwife

## 2019-08-11 ENCOUNTER — Other Ambulatory Visit (HOSPITAL_COMMUNITY)
Admission: RE | Admit: 2019-08-11 | Discharge: 2019-08-11 | Disposition: A | Discharge status: Home / Self Care | Payer: BLUE CROSS/BLUE SHIELD | Source: Ambulatory Visit | Attending: Certified Nurse Midwife | Admitting: Certified Nurse Midwife

## 2019-08-11 ENCOUNTER — Other Ambulatory Visit: Payer: Self-pay

## 2019-08-11 ENCOUNTER — Ambulatory Visit (INDEPENDENT_AMBULATORY_CARE_PROVIDER_SITE_OTHER): Payer: Medicaid Other | Admitting: Certified Nurse Midwife

## 2019-08-11 VITALS — BP 94/64 | HR 90 | Wt 265.3 lb

## 2019-08-11 DIAGNOSIS — O99210 Obesity complicating pregnancy, unspecified trimester: Secondary | ICD-10-CM | POA: Insufficient documentation

## 2019-08-11 DIAGNOSIS — O99213 Obesity complicating pregnancy, third trimester: Secondary | ICD-10-CM | POA: Insufficient documentation

## 2019-08-11 DIAGNOSIS — O26891 Other specified pregnancy related conditions, first trimester: Secondary | ICD-10-CM

## 2019-08-11 DIAGNOSIS — Z3492 Encounter for supervision of normal pregnancy, unspecified, second trimester: Secondary | ICD-10-CM

## 2019-08-11 DIAGNOSIS — N898 Other specified noninflammatory disorders of vagina: Secondary | ICD-10-CM

## 2019-08-11 DIAGNOSIS — Z3A13 13 weeks gestation of pregnancy: Secondary | ICD-10-CM

## 2019-08-11 DIAGNOSIS — Z6841 Body Mass Index (BMI) 40.0 and over, adult: Secondary | ICD-10-CM | POA: Insufficient documentation

## 2019-08-11 DIAGNOSIS — O99211 Obesity complicating pregnancy, first trimester: Secondary | ICD-10-CM

## 2019-08-11 DIAGNOSIS — Z0283 Encounter for blood-alcohol and blood-drug test: Secondary | ICD-10-CM

## 2019-08-11 DIAGNOSIS — R638 Other symptoms and signs concerning food and fluid intake: Secondary | ICD-10-CM

## 2019-08-11 DIAGNOSIS — Z124 Encounter for screening for malignant neoplasm of cervix: Secondary | ICD-10-CM | POA: Diagnosis present

## 2019-08-11 DIAGNOSIS — Z113 Encounter for screening for infections with a predominantly sexual mode of transmission: Secondary | ICD-10-CM

## 2019-08-11 DIAGNOSIS — Z130 Encounter for screening for diseases of the blood and blood-forming organs and certain disorders involving the immune mechanism: Secondary | ICD-10-CM

## 2019-08-11 LAB — POCT URINALYSIS DIPSTICK OB
Bilirubin, UA: NEGATIVE
Glucose, UA: NEGATIVE
Ketones, UA: NEGATIVE
Nitrite, UA: NEGATIVE
Spec Grav, UA: 1.015 (ref 1.010–1.025)
Urobilinogen, UA: 0.2 E.U./dL
pH, UA: 6.5 (ref 5.0–8.0)

## 2019-08-11 MED ORDER — TRIAMCINOLONE ACETONIDE 0.1 % EX CREA: 1.0000 "application " | TOPICAL_CREAM | Freq: Two times a day (BID) | CUTANEOUS | 0 refills | Status: DC

## 2019-08-11 NOTE — Progress Notes (Signed)
Error

## 2019-08-11 NOTE — Progress Notes (Signed)
Patient here for NOB physical, no complaints.  

## 2019-08-11 NOTE — Progress Notes (Signed)
cNEW OB HISTORY AND PHYSICAL  SUBJECTIVE:       Dominique Sanders is a 23 y.o. G1P0 female, Patient's last menstrual period was 11/06/2018 (lmp unknown)., Estimated Date of Delivery: 02/16/20, [id number], presents today for establishment of Prenatal Care.  She has no unusual complaints and complains of breast tenderness, skin changes, and intermittent constipation.    Desires genetic screening; wants Mom to receive results.    Denies difficulty breathing or respiratory distress, chest pain, abdominal pain, vaginal bleeding, dysuria, and leg pain or swelling.    Gynecologic History  Patient's last menstrual period was 11/06/2018 (lmp unknown).  Period Cycle (Days): 28 Period Duration (Days): 5 Period Pattern: Regular Menstrual Flow: Heavy, Light Menstrual Control: Tampon Dysmenorrhea: (!) Severe Dysmenorrhea Symptoms: Cramping  Contraception: none  Last Pap: due.   Obstetric History  OB History  Gravida Para Term Preterm AB Living  1            SAB TAB Ectopic Multiple Live Births               # Outcome Date GA Lbr Len/2nd Weight Sex Delivery Anes PTL Lv  1 Current             History reviewed. No pertinent past medical history.  Past Surgical History:  Procedure Laterality Date  . no surgical       Current Outpatient Medications on File Prior to Visit  Medication Sig Dispense Refill  . Prenatal-DSS-FeCb-FeGl-FA (CITRANATAL BLOOM) 90-1 MG TABS Take 1 mg by mouth daily. 30 tablet 12  . promethazine (PHENERGAN) 25 MG tablet Take 1 tablet (25 mg total) by mouth every 6 (six) hours as needed for nausea or vomiting. 30 tablet 6   No current facility-administered medications on file prior to visit.     No Known Allergies  Social History   Socioeconomic History  . Marital status: Single    Spouse name: Not on file  . Number of children: Not on file  . Years of education: Not on file  . Highest education level: Not on file  Occupational History  . Not on file  Social  Needs  . Financial resource strain: Not on file  . Food insecurity    Worry: Not on file    Inability: Not on file  . Transportation needs    Medical: Not on file    Non-medical: Not on file  Tobacco Use  . Smoking status: Never Smoker  . Smokeless tobacco: Never Used  Substance and Sexual Activity  . Alcohol use: Not Currently  . Drug use: No  . Sexual activity: Yes    Birth control/protection: None  Lifestyle  . Physical activity    Days per week: Not on file    Minutes per session: Not on file  . Stress: Not on file  Relationships  . Social Musicianconnections    Talks on phone: Not on file    Gets together: Not on file    Attends religious service: Not on file    Active member of club or organization: Not on file    Attends meetings of clubs or organizations: Not on file    Relationship status: Not on file  . Intimate partner violence    Fear of current or ex partner: Not on file    Emotionally abused: Not on file    Physically abused: Not on file    Forced sexual activity: Not on file  Other Topics Concern  . Not on file  Social  History Narrative  . Not on file    Family History  Problem Relation Age of Onset  . Healthy Mother     The following portions of the patient's history were reviewed and updated as appropriate: allergies, current medications, past OB history, past medical history, past surgical history, past family history, past social history, and problem list.  Review of Systems:  ROS negative except as noted above. Information obtained from patient.   OBJECTIVE:  BP 94/64   Pulse 90   Wt 265 lb 4.8 oz (120.3 kg)   LMP 11/06/2018 (LMP Unknown)   BMI 48.52 kg/m   Initial Physical Exam (New OB)  GENERAL APPEARANCE: alert, well appearing, in no apparent distress  HEAD: normocephalic, atraumatic  MOUTH: mucous membranes moist, pharynx normal without lesions  THYROID: no thyromegaly or masses present  BREASTS: no masses noted, no significant  tenderness, no palpable axillary nodes, diffuse dry patches present  LUNGS: clear to auscultation, no wheezes, rales or rhonchi, symmetric air entry  HEART: regular rate and rhythm, no murmurs  ABDOMEN: soft, nontender, nondistended, no abnormal masses, no epigastric pain, obese and FHT present  EXTREMITIES: no redness or tenderness in the calves or thighs, no edema  SKIN: normal coloration and turgor, no rashes, diffuse dry patches present  LYMPH NODES: no adenopathy palpable  NEUROLOGIC: alert, oriented, normal speech, no focal findings or movement disorder noted  PELVIC EXAM EXTERNAL GENITALIA: normal appearing vulva with no masses, tenderness or lesions VAGINA: no abnormal discharge or lesions CERVIX: no lesions or cervical motion tenderness and Pap collected  ASSESSMENT: Normal pregnancy Obesity in pregnancy, BMI 35 Desires genetic screening, Panorama ordered today Screening for cervical cancer, Pap collected-see orders  PLAN: Prenatal care New OB counseling: The patient has been given an overview regarding routine prenatal care. Recommendations regarding diet, weight gain, and exercise in pregnancy were given. Prenatal testing, optional genetic testing, and ultrasound use in pregnancy were reviewed.  Benefits of Breast Feeding were discussed. The patient is encouraged to consider nursing her baby post partum. See orders   Diona Fanti, CNM [facility], [facility] 08/11/19 5:55 PM

## 2019-08-11 NOTE — Patient Instructions (Signed)
Vaginal Delivery  Vaginal delivery means that you give birth by pushing your baby out of your birth canal (vagina). A team of health care providers will help you before, during, and after vaginal delivery. Birth experiences are unique for every woman and every pregnancy, and birth experiences vary depending on where you choose to give birth. What happens when I arrive at the birth center or hospital? Once you are in labor and have been admitted into the hospital or birth center, your health care provider may:  Review your pregnancy history and any concerns that you have.  Insert an IV into one of your veins. This may be used to give you fluids and medicines.  Check your blood pressure, pulse, temperature, and heart rate (vital signs).  Check whether your bag of water (amniotic sac) has broken (ruptured).  Talk with you about your birth plan and discuss pain control options. Monitoring Your health care provider may monitor your contractions (uterine monitoring) and your baby's heart rate (fetal monitoring). You may need to be monitored:  Often, but not continuously (intermittently).  All the time or for long periods at a time (continuously). Continuous monitoring may be needed if: ? You are taking certain medicines, such as medicine to relieve pain or make your contractions stronger. ? You have pregnancy or labor complications. Monitoring may be done by:  Placing a special stethoscope or a handheld monitoring device on your abdomen to check your baby's heartbeat and to check for contractions.  Placing monitors on your abdomen (external monitors) to record your baby's heartbeat and the frequency and length of contractions.  Placing monitors inside your uterus through your vagina (internal monitors) to record your baby's heartbeat and the frequency, length, and strength of your contractions. Depending on the type of monitor, it may remain in your uterus or on your baby's head until  birth.  Telemetry. This is a type of continuous monitoring that can be done with external or internal monitors. Instead of having to stay in bed, you are able to move around during telemetry. Physical exam Your health care provider may perform frequent physical exams. This may include:  Checking how and where your baby is positioned in your uterus.  Checking your cervix to determine: ? Whether it is thinning out (effacing). ? Whether it is opening up (dilating). What happens during labor and delivery?  Normal labor and delivery is divided into the following three stages: Stage 1  This is the longest stage of labor.  This stage can last for hours or days.  Throughout this stage, you will feel contractions. Contractions generally feel mild, infrequent, and irregular at first. They get stronger, more frequent (about every 2-3 minutes), and more regular as you move through this stage.  This stage ends when your cervix is completely dilated to 4 inches (10 cm) and completely effaced. Stage 2  This stage starts once your cervix is completely effaced and dilated and lasts until the delivery of your baby.  This stage may last from 20 minutes to 2 hours.  This is the stage where you will feel an urge to push your baby out of your vagina.  You may feel stretching and burning pain, especially when the widest part of your baby's head passes through the vaginal opening (crowning).  Once your baby is delivered, the umbilical cord will be clamped and cut. This usually occurs after waiting a period of 1-2 minutes after delivery.  Your baby will be placed on your bare chest (  skin-to-skin contact) in an upright position and covered with a warm blanket. Watch your baby for feeding cues, like rooting or sucking, and help the baby to your breast for his or her first feeding. Stage 3  This stage starts immediately after the birth of your baby and ends after you deliver the placenta.  This stage may  take anywhere from 5 to 30 minutes.  After your baby has been delivered, you will feel contractions as your body expels the placenta and your uterus contracts to control bleeding. What can I expect after labor and delivery?  After labor is over, you and your baby will be monitored closely until you are ready to go home to ensure that you are both healthy. Your health care team will teach you how to care for yourself and your baby.  You and your baby will stay in the same room (rooming in) during your hospital stay. This will encourage early bonding and successful breastfeeding.  You may continue to receive fluids and medicines through an IV.  Your uterus will be checked and massaged regularly (fundal massage).  You will have some soreness and pain in your abdomen, vagina, and the area of skin between your vaginal opening and your anus (perineum).  If an incision was made near your vagina (episiotomy) or if you had some vaginal tearing during delivery, cold compresses may be placed on your episiotomy or your tear. This helps to reduce pain and swelling.  You may be given a squirt bottle to use instead of wiping when you go to the bathroom. To use the squirt bottle, follow these steps: ? Before you urinate, fill the squirt bottle with warm water. Do not use hot water. ? After you urinate, while you are sitting on the toilet, use the squirt bottle to rinse the area around your urethra and vaginal opening. This rinses away any urine and blood. ? Fill the squirt bottle with clean water every time you use the bathroom.  It is normal to have vaginal bleeding after delivery. Wear a sanitary pad for vaginal bleeding and discharge. Summary  Vaginal delivery means that you will give birth by pushing your baby out of your birth canal (vagina).  Your health care provider may monitor your contractions (uterine monitoring) and your baby's heart rate (fetal monitoring).  Your health care provider may  perform a physical exam.  Normal labor and delivery is divided into three stages.  After labor is over, you and your baby will be monitored closely until you are ready to go home. This information is not intended to replace advice given to you by your health care provider. Make sure you discuss any questions you have with your health care provider. Document Released: 09/18/2008 Document Revised: 01/14/2018 Document Reviewed: 01/14/2018 Elsevier Patient Education  [place]. Pain Relief During Labor and Delivery Many things can cause pain during labor and delivery, including:  Pressure on bones and ligaments due to the baby moving through the pelvis.  Stretching of tissues due to the baby moving through the birth canal.  Muscle tension due to anxiety or nervousness.  The uterus tightening (contracting) and relaxing to help move the baby. There are many ways to deal with the pain of labor and delivery. They include:  Taking prenatal classes. Taking these classes helps you know what to expect during your babys birth. What you learn will increase your confidence and decrease your anxiety.  Practicing relaxation techniques or doing relaxing activities, such as: ?  Focused breathing. ? Meditation. ? Visualization. ? Aroma therapy. ? Listening to your favorite music. ? Hypnosis.  Taking a warm shower or bath (hydrotherapy). This may: ? Provide comfort and relaxation. ? Lessen your perception of pain. ? Decrease the amount of pain medicine needed. ? Decrease the length of labor.  Getting a massage or counterpressure on your back.  Applying warm packs or ice packs.  Changing positions often, moving around, or using a birthing ball.  Getting: ? Pain medicine through an IV or injection into a muscle. ? Pain medicine inserted into your spinal column. ? Injections of sterile water just under the skin on your lower back (intradermal injections). ? Laughing gas (nitrous  oxide). Discuss your pain control options with your health care provider during your prenatal visits. Explore the options offered by your hospital or birth center. What kinds of medicine are available? There are two kinds of medicines that can be used to relieve pain during labor and delivery:  Analgesics. These medicines decrease pain without causing you to lose feeling or the ability to move your muscles.  Anesthetics. These medicines block feeling in the body and can decrease your ability to move freely. Both of these kinds of medicine can cause minor side effects, such as nausea, trouble concentrating, and sleepiness. They can also decrease the baby's heart rate before birth and affect the babys breathing rate after birth. For this reason, health care providers are careful about when and how much medicine is given. What are specific medicines and procedures that provide pain relief? Local Anesthetics Local anesthetics are used to numb a small area of the body. They may be used along with another kind of anesthetic or used to numb the nerves of the vagina, cervix, and perineum during the second stage of labor. General Anesthetics General anesthetics cause you to lose consciousness so you do not feel pain. They are usually only used for an emergency cesarean delivery. General anesthetics are given through an IV tube and a mask. Pudendal Block A pudendal block is a form of local anesthetic. It may be used to relieve the pain associated with pushing or stretching of the perineum at the time of delivery or to further numb the perineum. A pudendal block is done by injecting numbing medicine through the vaginal wall into a nerve in the pelvis. Epidural Analgesia Epidural analgesia is given through a flexible IV catheter that is inserted into the lower back. Numbing medicine is delivered continuously to the area near your spinal column nerves (epidural space). After having this type of analgesia, you  may be able to move your legs but you most likely will not be able to walk. Depending on the amount of medicine given, you may lose all feeling in the lower half of your body, or you may retain some level of sensation, including the urge to push. Epidural analgesia can be used to provide pain relief for a vaginal birth. Spinal Block A spinal block is similar to epidural analgesia, but the medicine is injected into the spinal fluid instead of the epidural space. A spinal block is only given once. It starts to relieve pain quickly, but the pain relief lasts only 1-6 hours. Spinal blocks can be used for cesarean deliveries. Combined Spinal-Epidural (CSE) Block A CSE block combines the effects of a spinal block and epidural analgesia. The spinal block works quickly to block all pain. The epidural analgesia provides continuous pain relief, even after the effects of the spinal block have worn  off. This information is not intended to replace advice given to you by your health care provider. Make sure you discuss any questions you have with your health care provider. Document Released: 03/28/2009 Document Revised: 11/22/2017 Document Reviewed: 05/02/2016 Elsevier Patient Education  2020 [employer].   [facility]  [place], [place], [place] [address]  Phone: [phone]   [facility] (second location)  [address] [place], [place] [address]  Phone: [phone]   [facility]) [place], [place], [place] [address] Phone: [phone]   [facility] [place], [place] [address]  Phone: [phone]  Breastfeeding  Choosing to breastfeed is one of the best decisions you can make for yourself and your baby. A change in hormones during pregnancy causes your breasts to make breast milk in your milk-producing glands. Hormones prevent breast milk from being released before your baby  is born. They also prompt milk flow after birth. Once breastfeeding has begun, thoughts of your baby, as well as his or her sucking or crying, can stimulate the release of milk from your milk-producing glands. Benefits of breastfeeding Research shows that breastfeeding offers many health benefits for infants and mothers. It also offers a cost-free and convenient way to feed your baby. For your baby  Your first milk (colostrum) helps your baby's digestive system to function better.  Special cells in your milk (antibodies) help your baby to fight off infections.  Breastfed babies are less likely to develop asthma, allergies, obesity, or type 2 diabetes. They are also at lower risk for sudden infant death syndrome (SIDS).  Nutrients in breast milk are better able to meet your babys needs compared to infant formula.  Breast milk improves your baby's brain development. For you  Breastfeeding helps to create a very special bond between you and your baby.  Breastfeeding is convenient. Breast milk costs nothing and is always available at the correct temperature.  Breastfeeding helps to burn calories. It helps you to lose the weight that you gained during pregnancy.  Breastfeeding makes your uterus return faster to its size before pregnancy. It also slows bleeding (lochia) after you give birth.  Breastfeeding helps to lower your risk of developing type 2 diabetes, osteoporosis, rheumatoid arthritis, cardiovascular disease, and breast, ovarian, uterine, and endometrial cancer later in life. Breastfeeding basics Starting breastfeeding  Find a comfortable place to sit or lie down, with your neck and back well-supported.  Place a pillow or a rolled-up blanket under your baby to bring him or her to the level of your breast (if you are seated). Nursing pillows are specially designed to help support your arms and your baby while you breastfeed.  Make sure that your baby's tummy (abdomen) is facing  your abdomen.  Gently massage your breast. With your fingertips, massage from the outer edges of your breast inward toward the nipple. This encourages milk flow. If your milk flows slowly, you may need to continue this action during the feeding.  Support your breast with 4 fingers underneath and your thumb above your nipple (make the letter "C" with your hand). Make sure your fingers are well away from your nipple and your babys mouth.  Stroke your baby's lips gently with your finger or nipple.  When your baby's mouth is open wide enough, quickly bring your baby to your breast, placing your entire nipple and as much of the areola as possible into your baby's mouth.  The areola is the colored area around your nipple. ? More areola should be visible above your baby's upper lip than below the lower lip. ? Your baby's lips should be opened and extended outward (flanged) to ensure an adequate, comfortable latch. ? Your baby's tongue should be between his or her lower gum and your breast.  Make sure that your baby's mouth is correctly positioned around your nipple (latched). Your baby's lips should create a seal on your breast and be turned out (everted).  It is common for your baby to suck about 2-3 minutes in order to start the flow of breast milk. Latching Teaching your baby how to latch onto your breast properly is very important. An improper latch can cause nipple pain, decreased milk supply, and poor weight gain in your baby. Also, if your baby is not latched onto your nipple properly, he or she may swallow some air during feeding. This can make your baby fussy. Burping your baby when you switch breasts during the feeding can help to get rid of the air. However, teaching your baby to latch on properly is still the best way to prevent fussiness from swallowing air while breastfeeding. Signs that your baby has successfully latched onto your nipple  Silent tugging or silent sucking, without causing  you pain. Infant's lips should be extended outward (flanged).  Swallowing heard between every 3-4 sucks once your milk has started to flow (after your let-down milk reflex occurs).  Muscle movement above and in front of his or her ears while sucking. Signs that your baby has not successfully latched onto your nipple  Sucking sounds or smacking sounds from your baby while breastfeeding.  Nipple pain. If you think your baby has not latched on correctly, slip your finger into the corner of your babys mouth to break the suction and place it between your baby's gums. Attempt to start breastfeeding again. Signs of successful breastfeeding Signs from your baby  Your baby will gradually decrease the number of sucks or will completely stop sucking.  Your baby will fall asleep.  Your baby's body will relax.  Your baby will retain a small amount of milk in his or her mouth.  Your baby will let go of your breast by himself or herself. Signs from you  Breasts that have increased in firmness, weight, and size 1-3 hours after feeding.  Breasts that are softer immediately after breastfeeding.  Increased milk volume, as well as a change in milk consistency and color by the fifth day of breastfeeding.  Nipples that are not sore, cracked, or bleeding. Signs that your baby is getting enough milk  Wetting at least 1-2 diapers during the first 24 hours after birth.  Wetting at least 5-6 diapers every 24 hours for the first week after birth. The urine should be clear or pale yellow by the age of 5 days.  Wetting 6-8 diapers every 24 hours as your baby continues to grow and develop.  At least 3 stools in a 24-hour period by the age of 5 days. The stool should be soft and yellow.  At least 3 stools in a 24-hour period by the age of 7 days. The stool should be seedy and yellow.  No loss of weight greater than 10% of birth weight during the first 3 days of life.  Average weight gain of 4-7 oz  (113-198 g) per week after the age of 4 days.  Consistent daily weight gain by the age of 5 days, without  weight loss after the age of 2 weeks. After a feeding, your baby may spit up a small amount of milk. This is normal. Breastfeeding frequency and duration Frequent feeding will help you make more milk and can prevent sore nipples and extremely full breasts (breast engorgement). Breastfeed when you feel the need to reduce the fullness of your breasts or when your baby shows signs of hunger. This is called "breastfeeding on demand." Signs that your baby is hungry include:  Increased alertness, activity, or restlessness.  Movement of the head from side to side.  Opening of the mouth when the corner of the mouth or cheek is stroked (rooting).  Increased sucking sounds, smacking lips, cooing, sighing, or squeaking.  Hand-to-mouth movements and sucking on fingers or hands.  Fussing or crying. Avoid introducing a pacifier to your baby in the first 4-6 weeks after your baby is born. After this time, you may choose to use a pacifier. Research has shown that pacifier use during the first year of a baby's life decreases the risk of sudden infant death syndrome (SIDS). Allow your baby to feed on each breast as long as he or she wants. When your baby unlatches or falls asleep while feeding from the first breast, offer the second breast. Because newborns are often sleepy in the first few weeks of life, you may need to awaken your baby to get him or her to feed. Breastfeeding times will vary from baby to baby. However, the following rules can serve as a guide to help you make sure that your baby is properly fed:  Newborns (babies 38 weeks of age or younger) may breastfeed every 1-3 hours.  Newborns should not go without breastfeeding for longer than 3 hours during the day or 5 hours during the night.  You should breastfeed your baby a minimum of 8 times in a 24-hour period. Breast milk pumping      Pumping and storing breast milk allows you to make sure that your baby is exclusively fed your breast milk, even at times when you are unable to breastfeed. This is especially important if you go back to work while you are still breastfeeding, or if you are not able to be present during feedings. Your lactation consultant can help you find a method of pumping that works best for you and give you guidelines about how long it is safe to store breast milk. Caring for your breasts while you breastfeed Nipples can become dry, cracked, and sore while breastfeeding. The following recommendations can help keep your breasts moisturized and healthy:  Avoid using soap on your nipples.  Wear a supportive bra designed especially for nursing. Avoid wearing underwire-style bras or extremely tight bras (sports bras).  Air-dry your nipples for 3-4 minutes after each feeding.  Use only cotton bra pads to absorb leaked breast milk. Leaking of breast milk between feedings is normal.  Use lanolin on your nipples after breastfeeding. Lanolin helps to maintain your skin's normal moisture barrier. Pure lanolin is not harmful (not toxic) to your baby. You may also hand express a few drops of breast milk and gently massage that milk into your nipples and allow the milk to air-dry. In the first few weeks after giving birth, some women experience breast engorgement. Engorgement can make your breasts feel heavy, warm, and tender to the touch. Engorgement peaks within 3-5 days after you give birth. The following recommendations can help to ease engorgement:  Completely empty your breasts while breastfeeding or pumping. You  may want to start by applying warm, moist heat (in the shower or with warm, water-soaked hand towels) just before feeding or pumping. This increases circulation and helps the milk flow. If your baby does not completely empty your breasts while breastfeeding, pump any extra milk after he or she is  finished.  Apply ice packs to your breasts immediately after breastfeeding or pumping, unless this is too uncomfortable for you. To do this: ? Put ice in a plastic bag. ? Place a towel between your skin and the bag. ? Leave the ice on for 20 minutes, 2-3 times a day.  Make sure that your baby is latched on and positioned properly while breastfeeding. If engorgement persists after 48 hours of following these recommendations, contact your health care provider or a Science writer. Overall health care recommendations while breastfeeding  Eat 3 healthy meals and 3 snacks every day. Well-nourished mothers who are breastfeeding need an additional 450-500 calories a day. You can meet this requirement by increasing the amount of a balanced diet that you eat.  Drink enough water to keep your urine pale yellow or clear.  Rest often, relax, and continue to take your prenatal vitamins to prevent fatigue, stress, and low vitamin and mineral levels in your body (nutrient deficiencies).  Do not use any products that contain nicotine or tobacco, such as cigarettes and e-cigarettes. Your baby may be harmed by chemicals from cigarettes that pass into breast milk and exposure to secondhand smoke. If you need help quitting, ask your health care provider.  Avoid alcohol.  Do not use illegal drugs or marijuana.  Talk with your health care provider before taking any medicines. These include over-the-counter and prescription medicines as well as vitamins and herbal supplements. Some medicines that may be harmful to your baby can pass through breast milk.  It is possible to become pregnant while breastfeeding. If birth control is desired, ask your health care provider about options that will be safe while breastfeeding your baby. Where to find more information: [employer]: www.llli.org Contact a health care provider if:  You feel like you want to stop breastfeeding or have become  frustrated with breastfeeding.  Your nipples are cracked or bleeding.  Your breasts are red, tender, or warm.  You have: ? Painful breasts or nipples. ? A swollen area on either breast. ? A fever or chills. ? Nausea or vomiting. ? Drainage other than breast milk from your nipples.  Your breasts do not become full before feedings by the fifth day after you give birth.  You feel sad and depressed.  Your baby is: ? Too sleepy to eat well. ? Having trouble sleeping. ? More than 39 week old and wetting fewer than 6 diapers in a 24-hour period. ? Not gaining weight by 66 days of age.  Your baby has fewer than 3 stools in a 24-hour period.  Your baby's skin or the white parts of his or her eyes become yellow. Get help right away if:  Your baby is overly tired (lethargic) and does not want to wake up and feed.  Your baby develops an unexplained fever. Summary  Breastfeeding offers many health benefits for infant and mothers.  Try to breastfeed your infant when he or she shows early signs of hunger.  Gently tickle or stroke your baby's lips with your finger or nipple to allow the baby to open his or her mouth. Bring the baby to your breast. Make sure that much of the  [place] is in your baby's mouth. Offer one side and burp the baby before you offer the other side.  Talk with your health care provider or lactation consultant if you have questions or you face problems as you breastfeed. This information is not intended to replace advice given to you by your health care provider. Make sure you discuss any questions you have with your health care provider. Document Released: 12/10/2005 Document Revised: 03/06/2018 Document Reviewed: 01/11/2017 Elsevier Patient Education  [place]. Round Ligament Pain  The round ligament is a cord of muscle and tissue that helps support the uterus. It can become a source of pain during pregnancy if it becomes stretched or twisted as the baby  grows. The pain usually begins in the second trimester (13-28 weeks) of pregnancy, and it can come and go until the baby is delivered. It is not a serious problem, and it does not cause harm to the baby. Round ligament pain is usually a short, sharp, and pinching pain, but it can also be a dull, lingering, and aching pain. The pain is felt in the lower side of the abdomen or in the groin. It usually starts deep in the groin and moves up to the outside of the hip area. The pain may occur when you:  Suddenly change position, such as quickly going from a sitting to standing position.  Roll over in bed.  Cough or sneeze.  Do physical activity. Follow these instructions at home:   Watch your condition for any changes.  When the pain starts, relax. Then try any of these methods to help with the pain: ? Sitting down. ? Flexing your knees up to your abdomen. ? Lying on your side with one pillow under your abdomen and another pillow between your legs. ? Sitting in a warm bath for 15-20 minutes or until the pain goes away.  Take over-the-counter and prescription medicines only as told by your health care provider.  Move slowly when you sit down or stand up.  Avoid long walks if they cause pain.  Stop or reduce your physical activities if they cause pain.  Keep all follow-up visits as told by your health care provider. This is important. Contact a health care provider if:  Your pain does not go away with treatment.  You feel pain in your back that you did not have before.  Your medicine is not helping. Get help right away if:  You have a fever or chills.  You develop uterine contractions.  You have vaginal bleeding.  You have nausea or vomiting.  You have diarrhea.  You have pain when you urinate. Summary  Round ligament pain is felt in the lower abdomen or groin. It is usually a short, sharp, and pinching pain. It can also be a dull, lingering, and aching pain.  This pain  usually begins in the second trimester (13-28 weeks). It occurs because the uterus is stretching with the growing baby, and it is not harmful to the baby.  You may notice the pain when you suddenly change position, when you cough or sneeze, or during physical activity.  Relaxing, flexing your knees to your abdomen, lying on one side, or taking a warm bath may help to get rid of the pain.  Get help from your health care provider if the pain does not go away or if you have vaginal bleeding, nausea, vomiting, diarrhea, or painful urination. This information is not intended to replace advice given to you by  your health care provider. Make sure you discuss any questions you have with your health care provider. Document Released: 09/18/2008 Document Revised: 05/28/2018 Document Reviewed: 05/28/2018 Elsevier Patient Education  [place]. Back Pain in Pregnancy Back pain during pregnancy is common. Back pain may be caused by several factors that are related to changes during your pregnancy. Follow these instructions at home: Managing pain, stiffness, and swelling      If directed, for sudden (acute) back pain, put ice on the painful area. ? Put ice in a plastic bag. ? Place a towel between your skin and the bag. ? Leave the ice on for 20 minutes, 2-3 times per day.  If directed, apply heat to the affected area before you exercise. Use the heat source that your health care provider recommends, such as a moist heat pack or a heating pad. ? Place a towel between your skin and the heat source. ? Leave the heat on for 20-30 minutes. ? Remove the heat if your skin turns bright red. This is especially important if you are unable to feel pain, heat, or cold. You may have a greater risk of getting burned.  If directed, massage the affected area. Activity  Exercise as told by your health care provider. Gentle exercise is the best way to prevent or manage back pain.  Listen to your body when  lifting. If lifting hurts, ask for help or bend your knees. This uses your leg muscles instead of your back muscles.  Squat down when picking up something from the floor. Do not bend over.  Only use bed rest for short periods as told by your health care provider. Bed rest should only be used for the most severe episodes of back pain. Standing, sitting, and lying down  Do not stand in one place for long periods of time.  Use good posture when sitting. Make sure your head rests over your shoulders and is not hanging forward. Use a pillow on your lower back if necessary.  Try sleeping on your side, preferably the left side, with a pregnancy support pillow or 1-2 regular pillows between your legs. ? If you have back pain after a night's rest, your bed may be too soft. ? A firm mattress may provide more support for your back during pregnancy. General instructions  Do not wear high heels.  Eat a healthy diet. Try to gain weight within your health care provider's recommendations.  Use a maternity girdle, elastic sling, or back brace as told by your health care provider.  Take over-the-counter and prescription medicines only as told by your health care provider.  Work with a physical therapist or massage therapist to find ways to manage back pain. Acupuncture or massage therapy may be helpful.  Keep all follow-up visits as told by your health care provider. This is important. Contact a health care provider if:  Your back pain interferes with your daily activities.  You have increasing pain in other parts of your body. Get help right away if:  You develop numbness, tingling, weakness, or problems with the use of your arms or legs.  You develop severe back pain that is not controlled with medicine.  You have a change in bowel or bladder control.  You develop shortness of breath, dizziness, or you faint.  You develop nausea, vomiting, or sweating.  You have back pain that is a  rhythmic, cramping pain similar to labor pains. Labor pain is usually 1-2 minutes apart, lasts for about  1 minute, and involves a bearing down feeling or pressure in your pelvis.  You have back pain and your water breaks or you have vaginal bleeding.  You have back pain or numbness that travels down your leg.  Your back pain developed after you fell.  You develop pain on one side of your back.  You see blood in your urine.  You develop skin blisters in the area of your back pain. Summary  Back pain may be caused by several factors that are related to changes during your pregnancy.  Follow instructions as told by your health care provider for managing pain, stiffness, and swelling.  Exercise as told by your health care provider. Gentle exercise is the best way to prevent or manage back pain.  Take over-the-counter and prescription medicines only as told by your health care provider.  Keep all follow-up visits as told by your health care provider. This is important. This information is not intended to replace advice given to you by your health care provider. Make sure you discuss any questions you have with your health care provider. Document Released: 03/20/2006 Document Revised: 03/31/2019 Document Reviewed: 05/28/2018 Elsevier Patient Education  [place]. WHAT OB PATIENTS CAN EXPECT   Confirmation of pregnancy and ultrasound ordered if medically indicated-[redacted] weeks gestation  New OB (NOB) intake with nurse and New OB (NOB) labs- [redacted] weeks gestation  New OB (NOB) physical examination with provider- 11/[redacted] weeks gestation  Flu vaccine-[redacted] weeks gestation  Anatomy scan-[redacted] weeks gestation  Glucose tolerance test, blood work to test for anemia, T-dap vaccine-[redacted] weeks gestation  Vaginal swabs/cultures-STD/Group B strep-[redacted] weeks gestation  Appointments every 4 weeks until 28 weeks  Every 2 weeks from 28 weeks until 36 weeks  Weekly visits from 36 weeks until  delivery  Eating Plan for Pregnant Women While you are pregnant, your body requires additional nutrition to help support your growing baby. You also have a higher need for some vitamins and minerals, such as folic acid, calcium, iron, and vitamin D. Eating a healthy, well-balanced diet is very important for your health and your baby's health. Your need for extra calories varies for the three 59-monthsegments of your pregnancy (trimesters). For most women, it is recommended to consume:  150 extra calories a day during the first trimester.  300 extra calories a day during the second trimester.  300 extra calories a day during the third trimester. What are tips for following this plan?   Do not try to lose weight or go on a diet during pregnancy.  Limit your overall intake of foods that have "empty calories." These are foods that have little nutritional value, such as sweets, desserts, candies, and sugar-sweetened beverages.  Eat a variety of foods (especially fruits and vegetables) to get a full range of vitamins and minerals.  Take a prenatal vitamin to help meet your additional vitamin and mineral needs during pregnancy, specifically for folic acid, iron, calcium, and vitamin D.  Remember to stay active. Ask your health care provider what types of exercise and activities are safe for you.  Practice good food safety and cleanliness. Wash your hands before you eat and after you prepare raw meat. Wash all fruits and vegetables well before peeling or eating. Taking these actions can help to prevent food-borne illnesses that can be very dangerous to your baby, such as listeriosis. Ask your health care provider for more information about listeriosis. What does 150 extra calories look like? Healthy options that provide 150  extra calories each day could be any of the following:  6-8 oz (170-230 g) of plain low-fat yogurt with  cup of berries.  1 apple with 2 teaspoons (11 g) of peanut  butter.  Cut-up vegetables with  cup (60 g) of hummus.  8 oz (230 mL) or 1 cup of low-fat chocolate milk.  1 stick of string cheese with 1 medium orange.  1 peanut butter and jelly sandwich that is made with one slice of whole-wheat bread and 1 tsp (5 g) of peanut butter. For 300 extra calories, you could eat two of those healthy options each day. What is a healthy amount of weight to gain? The right amount of weight gain for you is based on your BMI before you became pregnant. If your BMI:  Was less than 18 (underweight), you should gain 28-40 lb (13-18 kg).  Was 18-24.9 (normal), you should gain 25-35 lb (11-16 kg).  Was 25-29.9 (overweight), you should gain 15-25 lb (7-11 kg).  Was 30 or greater (obese), you should gain 11-20 lb (5-9 kg). What if I am having twins or multiples? Generally, if you are carrying twins or multiples:  You may need to eat 300-600 extra calories a day.  The recommended range for total weight gain is 25-54 lb (11-25 kg), depending on your BMI before pregnancy.  Talk with your health care provider to find out about nutritional needs, weight gain, and exercise that is right for you. What foods can I eat?  Grains All grains. Choose whole grains, such as whole-wheat bread, oatmeal, or brown rice. Vegetables All vegetables. Eat a variety of colors and types of vegetables. Remember to wash your vegetables well before peeling or eating. Fruits All fruits. Eat a variety of colors and types of fruit. Remember to wash your fruits well before peeling or eating. Meats and other protein foods Lean meats, including chicken, [place], fish, and lean cuts of beef, veal, or pork. If you eat fish or seafood, choose options that are higher in omega-3 fatty acids and lower in mercury, such as salmon, herring, mussels, trout, sardines, pollock, shrimp, crab, and lobster. Tofu. Tempeh. Beans. Eggs. Peanut butter and other nut butters. Make sure that all meats, poultry, and  eggs are cooked to food-safe temperatures or "well-done." Two or more servings of fish are recommended each week in order to get the most benefits from omega-3 fatty acids that are found in seafood. Choose fish that are lower in mercury. You can find more information online:  [employer] Dairy Pasteurized milk and milk alternatives (such as almond milk). Pasteurized yogurt and pasteurized cheese. Cottage cheese. Sour cream. Beverages Water. Juices that contain 100% fruit juice or vegetable juice. Caffeine-free teas and decaffeinated coffee. Drinks that contain caffeine are okay to drink, but it is better to avoid caffeine. Keep your total caffeine intake to less than 200 mg each day (which is 12 oz or 355 mL of coffee, tea, or soda) or the limit as told by your health care provider. Fats and oils Fats and oils are okay to include in moderation. Sweets and desserts Sweets and desserts are okay to include in moderation. Seasoning and other foods All pasteurized condiments. The items listed above may not be a complete list of recommended foods and beverages. Contact your dietitian for more options. The items listed above may not be a complete list of foods and beverages [you/your child] can eat. Contact a dietitian for more information. What foods are not recommended? Vegetables Raw (unpasteurized)  vegetable juices. Fruits Unpasteurized fruit juices. Meats and other protein foods Lunch meats, bologna, hot dogs, or other deli meats. (If you must eat those meats, reheat them until they are steaming hot.) Refrigerated pat, meat spreads from a meat counter, smoked seafood that is found in the refrigerated section of a store. Raw or undercooked meats, poultry, and eggs. Raw fish, such as sushi or sashimi. Fish that have high mercury content, such as tilefish, shark, swordfish, and king mackerel. To learn more about mercury in fish, talk with your health care provider or look for online resources,  such as:  [employer] Dairy Raw (unpasteurized) milk and any foods that have raw milk in them. Soft cheeses, such as feta, queso blanco, queso fresco, Brie, Camembert cheeses, blue-veined cheeses, and Panela cheese (unless it is made with pasteurized milk, which must be stated on the label). Beverages Alcohol. Sugar-sweetened beverages, such as sodas, teas, or energy drinks. Seasoning and other foods Homemade fermented foods and drinks, such as pickles, sauerkraut, or kombucha drinks. (Store-bought pasteurized versions of these are okay.) Salads that are made in a store or deli, such as ham salad, chicken salad, egg salad, tuna salad, and seafood salad. The items listed above may not be a complete list of foods and beverages to avoid. Contact your dietitian for more information. The items listed above may not be a complete list of foods and beverages [you/your child] should avoid. Contact a dietitian for more information. Where to find more information To calculate the number of calories you need based on your height, weight, and activity level, you can use an online calculator such as:  MobileTransition.ch To calculate how much weight you should gain during pregnancy, you can use an online pregnancy weight gain calculator such as:  StreamingFood.com.cy Summary  While you are pregnant, your body requires additional nutrition to help support your growing baby.  Eat a variety of foods, especially fruits and vegetables to get a full range of vitamins and minerals.  Practice good food safety and cleanliness. Wash your hands before you eat and after you prepare raw meat. Wash all fruits and vegetables well before peeling or eating. Taking these actions can help to prevent food-borne illnesses, such as listeriosis, that can be very dangerous to your baby.  Do not eat raw meat or fish. Do not eat fish that have high mercury content, such as  tilefish, shark, swordfish, and king mackerel. Do not eat unpasteurized (raw) dairy.  Take a prenatal vitamin to help meet your additional vitamin and mineral needs during pregnancy, specifically for folic acid, iron, calcium, and vitamin D. This information is not intended to replace advice given to you by your health care provider. Make sure you discuss any questions you have with your health care provider. Document Released: 09/24/2014 Document Revised: 04/02/2019 Document Reviewed: 09/06/2017 Elsevier Patient Education  [place]. Common Medications Safe in Pregnancy  Acne:      Constipation:  Benzoyl Peroxide     Colace  Clindamycin      Dulcolax Suppository  Topica Erythromycin     Fibercon  Salicylic Acid      Metamucil         Miralax AVOID:        Senakot   Accutane    Cough:  Retin-A       Cough Drops  Tetracycline      Phenergan w/ Codeine if Rx  Minocycline      Robitussin (Plain & DM)  Antibiotics:  Crabs/Lice:  Ceclor       RID  Cephalosporins    AVOID:  E-Mycins      Kwell  Keflex  Macrobid/Macrodantin   Diarrhea:  Penicillin      Kao-Pectate  Zithromax      Imodium AD         PUSH FLUIDS AVOID:       Cipro     Fever:  Tetracycline      Tylenol (Regular or Extra  Minocycline       Strength)  Levaquin      Extra Strength-Do not          Exceed 8 tabs/24 hrs Caffeine:        <271m/day (equiv. To 1 cup of coffee or  approx. 3 12 oz sodas)         Gas: Cold/Hayfever:       Gas-X  Benadryl      Mylicon  Claritin       Phazyme  **Claritin-D        Chlor-Trimeton    Headaches:  Dimetapp      ASA-Free Excedrin  Drixoral-Non-Drowsy     Cold Compress  Mucinex (Guaifenasin)     Tylenol (Regular or Extra  Sudafed/Sudafed-12 Hour     Strength)  **Sudafed PE Pseudoephedrine   Tylenol Cold & Sinus     Vicks Vapor Rub  Zyrtec  **AVOID if Problems With Blood Pressure         Heartburn: Avoid lying down for at least 1 hour after  meals  Aciphex      Maalox     Rash:  Milk of Magnesia     Benadryl    Mylanta       1% Hydrocortisone Cream  Pepcid  Pepcid Complete   Sleep Aids:  Prevacid      Ambien   Prilosec       Benadryl  Rolaids       Chamomile Tea  Tums (Limit 4/day)     Unisom  Zantac       Tylenol PM         Warm milk-add vanilla or  Hemorrhoids:       Sugar for taste  Anusol/Anusol H.C.  (RX: Analapram 2.5%)  Sugar Substitutes:  Hydrocortisone OTC     Ok in moderation  Preparation H      Tucks        Vaseline lotion applied to tissue with wiping    Herpes:     Throat:  Acyclovir      Oragel  Famvir  Valtrex     Vaccines:         Flu Shot Leg Cramps:       *Gardasil  Benadryl      Hepatitis A         Hepatitis B Nasal Spray:       Pneumovax  Saline Nasal Spray     Polio Booster         Tetanus Nausea:       Tuberculosis test or PPD  Vitamin B6 25 mg TID   AVOID:    Dramamine      *Gardasil  Emetrol       Live Poliovirus  Ginger Root 250 mg QID    MMR (measles, mumps &  High Complex Carbs @ Bedtime    rebella)  Sea Bands-Accupressure    Varicella (Chickenpox)  Unisom 1/2 tab TID     *No known complications  If received before Pain:         Known pregnancy;   Darvocet       Resume series after  Lortab        Delivery  Percocet    Yeast:   Tramadol      Femstat  Tylenol 3      Gyne-lotrimin  Ultram       Monistat  Vicodin           MISC:         All Sunscreens           Hair Coloring/highlights          Insect Repellant's          (Including DEET)         Mystic Tans Second Trimester of Pregnancy  The second trimester is from week 14 through week 27 (month 4 through 6). This is often the time in pregnancy that you feel your best. Often times, morning sickness has lessened or quit. You may have more energy, and you may get hungry more often. Your unborn baby is growing rapidly. At the end of the sixth month, he or she is about 9 inches long and weighs about 1 pounds. You  will likely feel the baby move between 18 and 20 weeks of pregnancy. Follow these instructions at home: Medicines  Take over-the-counter and prescription medicines only as told by your doctor. Some medicines are safe and some medicines are not safe during pregnancy.  Take a prenatal vitamin that contains at least 600 micrograms (mcg) of folic acid.  If you have trouble pooping (constipation), take medicine that will make your stool soft (stool softener) if your doctor approves. Eating and drinking   Eat regular, healthy meals.  Avoid raw meat and uncooked cheese.  If you get low calcium from the food you eat, talk to your doctor about taking a daily calcium supplement.  Avoid foods that are high in fat and sugars, such as fried and sweet foods.  If you feel sick to your stomach (nauseous) or throw up (vomit): ? Eat 4 or 5 small meals a day instead of 3 large meals. ? Try eating a few soda crackers. ? Drink liquids between meals instead of during meals.  To prevent constipation: ? Eat foods that are high in fiber, like fresh fruits and vegetables, whole grains, and beans. ? Drink enough fluids to keep your pee (urine) clear or pale yellow. Activity  Exercise only as told by your doctor. Stop exercising if you start to have cramps.  Do not exercise if it is too hot, too humid, or if you are in a place of great height (high altitude).  Avoid heavy lifting.  Wear low-heeled shoes. Sit and stand up straight.  You can continue to have sex unless your doctor tells you not to. Relieving pain and discomfort  Wear a good support bra if your breasts are tender.  Take warm water baths (sitz baths) to soothe pain or discomfort caused by hemorrhoids. Use hemorrhoid cream if your doctor approves.  Rest with your legs raised if you have leg cramps or low back pain.  If you develop puffy, bulging veins (varicose veins) in your legs: ? Wear support hose or compression stockings as told  by your doctor. ? Raise (elevate) your feet for 15 minutes, 3-4 times a day. ? Limit salt in your food. Prenatal care  Write down your questions. Take them to your prenatal visits.  Keep all your prenatal visits as told by your doctor. This is important. Safety  Wear your seat belt when driving.  Make a list of emergency phone numbers, including numbers for family, friends, the hospital, and police and fire departments. General instructions  Ask your doctor about the right foods to eat or for help finding a counselor, if you need these services.  Ask your doctor about local prenatal classes. Begin classes before month 6 of your pregnancy.  Do not use hot tubs, steam rooms, or saunas.  Do not douche or use tampons or scented sanitary pads.  Do not cross your legs for long periods of time.  Visit your dentist if you have not done so. Use a soft toothbrush to brush your teeth. Floss gently.  Avoid all smoking, herbs, and alcohol. Avoid drugs that are not approved by your doctor.  Do not use any products that contain nicotine or tobacco, such as cigarettes and e-cigarettes. If you need help quitting, ask your doctor.  Avoid cat litter boxes and soil used by cats. These carry germs that can cause birth defects in the baby and can cause a loss of your baby (miscarriage) or stillbirth. Contact a doctor if:  You have mild cramps or pressure in your lower belly.  You have pain when you pee (urinate).  You have bad smelling fluid coming from your vagina.  You continue to feel sick to your stomach (nauseous), throw up (vomit), or have watery poop (diarrhea).  You have a nagging pain in your belly area.  You feel dizzy. Get help right away if:  You have a fever.  You are leaking fluid from your vagina.  You have spotting or bleeding from your vagina.  You have severe belly cramping or pain.  You lose or gain weight rapidly.  You have trouble catching your breath and have  chest pain.  You notice sudden or extreme puffiness (swelling) of your face, hands, ankles, feet, or legs.  You have not felt the baby move in over an hour.  You have severe headaches that do not go away when you take medicine.  You have trouble seeing. Summary  The second trimester is from week 14 through week 27 (months 4 through 6). This is often the time in pregnancy that you feel your best.  To take care of yourself and your unborn baby, you will need to eat healthy meals, take medicines only if your doctor tells you to do so, and do activities that are safe for you and your baby.  Call your doctor if you get sick or if you notice anything unusual about your pregnancy. Also, call your doctor if you need help with the right food to eat, or if you want to know what activities are safe for you. This information is not intended to replace advice given to you by your health care provider. Make sure you discuss any questions you have with your health care provider. Document Released: 03/06/2010 Document Revised: 04/03/2019 Document Reviewed: 01/15/2017 Elsevier Patient Education  2020 [employer].

## 2019-08-12 LAB — CBC WITH DIFFERENTIAL/PLATELET
Basophils Absolute: 0 10*3/uL (ref 0.0–0.2)
Basos: 0 %
EOS (ABSOLUTE): 0.2 10*3/uL (ref 0.0–0.4)
Eos: 2 %
Hematocrit: 36.8 % (ref 34.0–46.6)
Hemoglobin: 12.5 g/dL (ref 11.1–15.9)
Immature Grans (Abs): 0.1 10*3/uL (ref 0.0–0.1)
Immature Granulocytes: 1 %
Lymphocytes Absolute: 1.5 10*3/uL (ref 0.7–3.1)
Lymphs: 12 %
MCH: 29.1 pg (ref 26.6–33.0)
MCHC: 34 g/dL (ref 31.5–35.7)
MCV: 86 fL (ref 79–97)
Monocytes Absolute: 0.5 10*3/uL (ref 0.1–0.9)
Monocytes: 4 %
Neutrophils Absolute: 10 10*3/uL — ABNORMAL HIGH (ref 1.4–7.0)
Neutrophils: 81 %
Platelets: 246 10*3/uL (ref 150–450)
RBC: 4.3 x10E6/uL (ref 3.77–5.28)
RDW: 12.7 % (ref 11.7–15.4)
WBC: 12.4 10*3/uL — ABNORMAL HIGH (ref 3.4–10.8)

## 2019-08-12 LAB — URINALYSIS, ROUTINE W REFLEX MICROSCOPIC
Bilirubin, UA: NEGATIVE
Glucose, UA: NEGATIVE
Ketones, UA: NEGATIVE
Leukocytes,UA: NEGATIVE
Nitrite, UA: NEGATIVE
Specific Gravity, UA: 1.023 (ref 1.005–1.030)
Urobilinogen, Ur: 0.2 mg/dL (ref 0.2–1.0)
pH, UA: 6.5 (ref 5.0–7.5)

## 2019-08-12 LAB — COMPREHENSIVE METABOLIC PANEL
ALT: 11 IU/L (ref 0–32)
AST: 9 IU/L (ref 0–40)
Albumin/Globulin Ratio: 1.4 (ref 1.2–2.2)
Albumin: 3.7 g/dL — ABNORMAL LOW (ref 3.9–5.0)
Alkaline Phosphatase: 63 IU/L (ref 39–117)
BUN/Creatinine Ratio: 15 (ref 9–23)
BUN: 9 mg/dL (ref 6–20)
Bilirubin Total: <0.2 mg/dL (ref 0.0–1.2)
CO2: 24 mmol/L (ref 20–29)
Calcium: 9.5 mg/dL (ref 8.7–10.2)
Chloride: 100 mmol/L (ref 96–106)
Creatinine, Ser: 0.62 mg/dL (ref 0.57–1.00)
GFR calc Af Amer: 148 mL/min/{1.73_m2} (ref >59)
GFR calc non Af Amer: 128 mL/min/{1.73_m2} (ref >59)
Globulin, Total: 2.6 g/dL (ref 1.5–4.5)
Glucose: 141 mg/dL — ABNORMAL HIGH (ref 65–99)
Potassium: 4.3 mmol/L (ref 3.5–5.2)
Sodium: 135 mmol/L (ref 134–144)
Total Protein: 6.3 g/dL (ref 6.0–8.5)

## 2019-08-12 LAB — NICOTINE SCREEN, URINE: Cotinine Ql Scrn, Ur: NEGATIVE ng/mL

## 2019-08-12 LAB — MONITOR DRUG PROFILE 14(MW)
Amphetamine Scrn, Ur: NEGATIVE ng/mL
BARBITURATE SCREEN URINE: NEGATIVE ng/mL
BENZODIAZEPINE SCREEN, URINE: NEGATIVE ng/mL
Buprenorphine, Urine: NEGATIVE ng/mL
CANNABINOIDS UR QL SCN: NEGATIVE ng/mL
Cocaine (Metab) Scrn, Ur: NEGATIVE ng/mL
Creatinine(Crt), U: 157.8 mg/dL (ref 20.0–300.0)
Fentanyl, Urine: NEGATIVE pg/mL
Meperidine Screen, Urine: NEGATIVE ng/mL
Methadone Screen, Urine: NEGATIVE ng/mL
OXYCODONE+OXYMORPHONE UR QL SCN: NEGATIVE ng/mL
Opiate Scrn, Ur: NEGATIVE ng/mL
Ph of Urine: 6.2 (ref 4.5–8.9)
Phencyclidine Qn, Ur: NEGATIVE ng/mL
Propoxyphene Scrn, Ur: NEGATIVE ng/mL
SPECIFIC GRAVITY: 1.02
Tramadol Screen, Urine: NEGATIVE ng/mL

## 2019-08-12 LAB — MICROSCOPIC EXAMINATION: Casts: NONE SEEN /lpf

## 2019-08-12 LAB — VARICELLA ZOSTER ANTIBODY, IGG: Varicella zoster IgG: 3168 index (ref >165)

## 2019-08-12 LAB — CERVICOVAGINAL ANCILLARY ONLY
Bacterial vaginitis: POSITIVE — AB
Candida vaginitis: POSITIVE — AB
Trichomonas: NEGATIVE

## 2019-08-12 LAB — HIV ANTIBODY (ROUTINE TESTING W REFLEX): HIV Screen 4th Generation wRfx: NONREACTIVE

## 2019-08-12 LAB — SICKLE CELL SCREEN: Sickle Cell Screen: NEGATIVE

## 2019-08-12 LAB — ABO AND RH: Rh Factor: POSITIVE

## 2019-08-12 LAB — RUBELLA SCREEN: Rubella Antibodies, IGG: 9.04 index (ref >0.99)

## 2019-08-12 LAB — HEPATITIS B SURFACE ANTIGEN: Hepatitis B Surface Ag: NEGATIVE

## 2019-08-12 LAB — HEMOGLOBIN A1C
Est. average glucose Bld gHb Est-mCnc: 108 mg/dL
Hgb A1c MFr Bld: 5.4 % (ref 4.8–5.6)

## 2019-08-12 LAB — TSH: TSH: 2.81 u[IU]/mL (ref 0.450–4.500)

## 2019-08-12 LAB — RPR: RPR Ser Ql: NONREACTIVE

## 2019-08-13 ENCOUNTER — Encounter: Payer: Self-pay | Admitting: Certified Nurse Midwife

## 2019-08-13 ENCOUNTER — Other Ambulatory Visit: Payer: Self-pay

## 2019-08-13 LAB — CYTOLOGY - PAP
Diagnosis: NEGATIVE
Diagnosis: REACTIVE

## 2019-08-13 LAB — CULTURE, OB URINE

## 2019-08-13 LAB — URINE CULTURE, OB REFLEX

## 2019-08-13 MED ORDER — METRONIDAZOLE 500 MG PO TABS: 500.0000 mg | ORAL_TABLET | Freq: Two times a day (BID) | ORAL | 0 refills | Status: DC

## 2019-08-13 MED ORDER — TERCONAZOLE 0.4 % VA CREA: 1.0000 | TOPICAL_CREAM | Freq: Every day | VAGINAL | 0 refills | Status: AC

## 2019-08-14 LAB — GC/CHLAMYDIA PROBE AMP
Chlamydia trachomatis, NAA: NEGATIVE
Neisseria Gonorrhoeae by PCR: NEGATIVE

## 2019-08-15 ENCOUNTER — Encounter: Payer: Self-pay | Admitting: Certified Nurse Midwife

## 2019-08-17 NOTE — Telephone Encounter (Signed)
Pt called to speak with Dalila in regards to going over her genetic testing results. Please advise.

## 2019-09-08 ENCOUNTER — Other Ambulatory Visit: Payer: Medicaid Other

## 2019-09-08 ENCOUNTER — Encounter: Payer: Self-pay | Admitting: Certified Nurse Midwife

## 2019-09-08 ENCOUNTER — Ambulatory Visit (INDEPENDENT_AMBULATORY_CARE_PROVIDER_SITE_OTHER): Payer: Medicaid Other | Admitting: Certified Nurse Midwife

## 2019-09-08 ENCOUNTER — Other Ambulatory Visit: Payer: Self-pay

## 2019-09-08 VITALS — BP 115/98 | HR 95 | Wt 269.4 lb

## 2019-09-08 DIAGNOSIS — Z3402 Encounter for supervision of normal first pregnancy, second trimester: Secondary | ICD-10-CM

## 2019-09-08 LAB — POCT URINALYSIS DIPSTICK OB
Bilirubin, UA: NEGATIVE
Blood, UA: NEGATIVE
Glucose, UA: NEGATIVE
Ketones, UA: NEGATIVE
Leukocytes, UA: NEGATIVE
Nitrite, UA: NEGATIVE
POC,PROTEIN,UA: NEGATIVE
Spec Grav, UA: 1.015 (ref 1.010–1.025)
Urobilinogen, UA: 0.2 E.U./dL
pH, UA: 6.5 (ref 5.0–8.0)

## 2019-09-08 NOTE — Patient Instructions (Signed)

## 2019-09-08 NOTE — Progress Notes (Signed)
ROB doing well. Discussed elevated BMI in pregnancy and recommendations for anesthesia consult. Order placed, letter faxed for consultation.Discussed Midwifery care and transfer to MD side if BMI exceeds 50, pt verbalizes understanding.  Early glucose screen today. Will follow up with results. Ready set baby information given, sign on reviewed. Pt completed online training.   Philip Aspen, CNM

## 2019-09-08 NOTE — Progress Notes (Signed)
Refused Flu shot,

## 2019-09-09 ENCOUNTER — Other Ambulatory Visit: Payer: Self-pay | Admitting: Certified Nurse Midwife

## 2019-09-09 DIAGNOSIS — R7309 Other abnormal glucose: Secondary | ICD-10-CM

## 2019-09-09 LAB — GLUCOSE, 1 HOUR GESTATIONAL: Gestational Diabetes Screen: 179 mg/dL — ABNORMAL HIGH (ref 65–139)

## 2019-09-09 NOTE — Progress Notes (Signed)
I hr early glucose elevated 3 hr ordered.   Philip Aspen, CNM

## 2019-09-21 ENCOUNTER — Other Ambulatory Visit: Payer: Self-pay | Admitting: Certified Nurse Midwife

## 2019-09-21 DIAGNOSIS — O99810 Abnormal glucose complicating pregnancy: Secondary | ICD-10-CM

## 2019-09-24 ENCOUNTER — Other Ambulatory Visit: Payer: Self-pay

## 2019-09-24 ENCOUNTER — Other Ambulatory Visit: Payer: Medicaid Other

## 2019-09-24 DIAGNOSIS — O99810 Abnormal glucose complicating pregnancy: Secondary | ICD-10-CM

## 2019-09-25 ENCOUNTER — Other Ambulatory Visit: Payer: Self-pay | Admitting: Certified Nurse Midwife

## 2019-09-25 DIAGNOSIS — O24419 Gestational diabetes mellitus in pregnancy, unspecified control: Secondary | ICD-10-CM

## 2019-09-25 LAB — GESTATIONAL GLUCOSE TOLERANCE
Glucose, Fasting: 104 mg/dL — ABNORMAL HIGH (ref 65–94)
Glucose, GTT - 1 Hour: 231 mg/dL — ABNORMAL HIGH (ref 65–179)
Glucose, GTT - 2 Hour: 258 mg/dL — ABNORMAL HIGH (ref 65–154)
Glucose, GTT - 3 Hour: 156 mg/dL — ABNORMAL HIGH (ref 65–139)

## 2019-09-25 NOTE — Progress Notes (Signed)
Referral placed for [facility] for GDM.   Philip Aspen, CNM

## 2019-09-28 ENCOUNTER — Encounter
Admission: RE | Admit: 2019-09-28 | Discharge: 2019-09-28 | Disposition: A | Discharge status: Home / Self Care | Payer: BLUE CROSS/BLUE SHIELD | Source: Ambulatory Visit | Attending: Anesthesiology | Admitting: Anesthesiology

## 2019-09-28 ENCOUNTER — Other Ambulatory Visit: Payer: Self-pay

## 2019-09-28 NOTE — Consult Note (Signed)
[facility] Anesthesia Consultation  Aaliyana Fredericks ENM:[id number] DOB: November 19, 1996 DOA: 09/28/2019 PCP: Patient, No Pcp Per   Requesting physician: Doreene Burke, CNM  Date of consultation: 09/28/19 Reason for consultation: Obesity during pregnancy  CHIEF COMPLAINT:  Obesity during pregnancy  HISTORY OF PRESENT ILLNESS: Waylon Koffler  is a 23 y.o. female with a known history of obesity during pregnancy. This is her first pregnancy. Denies hx of cardiovascular disease or asthma. Denies personal or family hx of bleeding disorders.   PAST MEDICAL HISTORY:  No past medical history on file.  PAST SURGICAL HISTORY:  Past Surgical History:  Procedure Laterality Date  . no surgical       SOCIAL HISTORY:  Social History   Tobacco Use  . Smoking status: Never Smoker  . Smokeless tobacco: Never Used  Substance Use Topics  . Alcohol use: Not Currently    FAMILY HISTORY:  Family History  Problem Relation Age of Onset  . Healthy Mother     DRUG ALLERGIES: No Known Allergies  REVIEW OF SYSTEMS:   RESPIRATORY: No cough, shortness of breath, wheezing.  CARDIOVASCULAR: No chest pain, orthopnea, edema.  HEMATOLOGY: No anemia, easy bruising or bleeding SKIN: No rash or lesion. NEUROLOGIC: No tingling, numbness, weakness.  PSYCHIATRY: No anxiety or depression.   MEDICATIONS AT HOME:  Prior to Admission medications   Medication Sig Start Date End Date Taking? Authorizing Provider  metroNIDAZOLE (FLAGYL) 500 MG tablet Take 1 tablet (500 mg total) by mouth 2 (two) times daily. 08/13/19   Gunnar Bulla, CNM  Prenatal Vit-Fe Fumarate-FA (PRENATAL MULTIVITAMIN) TABS tablet Take 1 tablet by mouth daily at 12 noon.    [provider]  Prenatal-DSS-FeCb-FeGl-FA (CITRANATAL BLOOM) 90-1 MG TABS Take 1 mg by mouth daily. Patient not taking: Reported on 09/08/2019 07/30/19   Gunnar Bulla, CNM  promethazine (PHENERGAN) 25 MG tablet Take  1 tablet (25 mg total) by mouth every 6 (six) hours as needed for nausea or vomiting. 07/30/19   Gunnar Bulla, CNM  triamcinolone cream (KENALOG) 0.1 % Apply 1 application topically 2 (two) times daily. Patient not taking: Reported on 09/08/2019 08/11/19   Gunnar Bulla, CNM      PHYSICAL EXAMINATION:   VITAL SIGNS: Height 5\' 2"  (1.575 m), weight 126.4 kg, last menstrual period 11/06/2018.  GENERAL:  23 y.o.-year-old patient no acute distress.  HEENT: Head atraumatic, normocephalic. Oropharynx and nasopharynx clear. MP 2, TM distance >3 cm, normal mouth opening. LUNGS: Normal breath sounds bilaterally, no wheezing, rales,rhonchi. No use of accessory muscles of respiration.  CARDIOVASCULAR: S1, S2 normal. No murmurs, rubs, or gallops.  EXTREMITIES: No pedal edema, cyanosis, or clubbing.  NEUROLOGIC: normal gait PSYCHIATRIC: The patient is alert and oriented x 3.  SKIN: No obvious rash, lesion, or ulcer.    IMPRESSION AND PLAN:   Blaire Palomino  is a 23 y.o. female presenting with obesity during pregnancy. BMI is currently 50 at [redacted] weeks gestation.   Airway exam reassuring today.   Pt unable to report weight gain goals given to her by OB. Recommended discussing with them what her weight gain/no gain goals should be.   We discussed analgesic options during labor including epidural analgesia. Discussed that in obesity there can be increased difficulty with epidural placement or even failure of successful epidural. We also discussed that even after successful epidural placement there is increased risk of catheter migration out of the epidural space that would require catheter replacement. Discussed use of epidural vs  spinal vs GA if cesarean delivery is required. Discussed increased risk of difficult intubation during pregnancy should an emergency cesarean delivery be required.   We discussed repeat evaluation at 34 weeks by anesthesia to determine whether there is a high  risk of complications of anesthesia for which we would recommend transfer of OB care to a facility with a higher maternal level of care designation.

## 2019-09-29 ENCOUNTER — Encounter: Payer: BLUE CROSS/BLUE SHIELD | Attending: Cardiology | Admitting: Dietician

## 2019-09-29 ENCOUNTER — Encounter: Payer: Self-pay | Admitting: Dietician

## 2019-09-29 VITALS — BP 120/66 | Ht 62.0 in | Wt 275.4 lb

## 2019-09-29 DIAGNOSIS — Z3A00 Weeks of gestation of pregnancy not specified: Secondary | ICD-10-CM | POA: Diagnosis not present

## 2019-09-29 DIAGNOSIS — O24419 Gestational diabetes mellitus in pregnancy, unspecified control: Secondary | ICD-10-CM | POA: Insufficient documentation

## 2019-09-29 DIAGNOSIS — O24410 Gestational diabetes mellitus in pregnancy, diet controlled: Secondary | ICD-10-CM

## 2019-09-29 NOTE — Progress Notes (Signed)
Appt. Start Time: 1500   Appt. End Time: 1630  GDM Class 1 Diabetes Overview - define DM; state own type of DM; identify functions of pancreas and insulin; define insulin deficiency vs insulin resistance  Psychosocial - identify DM as a source of stress; state the effects of stress on BG control; verbalize appropriate stress management techniques; identify personal stress issues   Nutritional Management - describe effects of food on blood glucose; identify sources of carbohydrate, protein and fat; verbalize the importance of balance meals in controlling blood glucose; identify meals as well balanced or not; estimate servings of carbohydrate from menus; use food labels to identify servings size, content of carbohydrate, fiber, protein, fat, saturated fat and sodium; recognize food sources of fat, saturated fat, trans fat, sodium and verbalize goals for intake; describe healthful appropriate food choices when dining out   Exercise - describe the effects of exercise on blood glucose and importance of regular exercise in controlling diabetes; state a plan for personal exercise; verbalize contraindications for exercise  Medications - state name, dose, timing of currently prescribed medications; describe types of medications available for diabetes  Insulin Training - prepare and administer insulin accurately; state correct rotation pattern; verbalize safe and lawful needle disposal  Self-Monitoring - state importance of HBGM and demo procedure accurately; use HBGM results to effectively manage diabetes; identify importance of regular HbA1C testing and goals for results-gave pt Contour Next meter and instructed her on its use-BG 86 (2 hr pp)  Acute Complications/Sick Day Guidelines - recognize hyperglycemia and hypoglycemia with causes and effects; identify blood glucose results as high, low or in control; list steps in treating and preventing high and low blood glucose; state appropriate measure to manage  blood glucose when ill (need for meds, HBGM plan, when to call physician, need for fluids)  Chronic Complications/Foot, Skin, Eye Dental Care - identify possible long-term complications of diabetes (retinopathy, neuropathy, nephropathy, cardiovascular disease, infections); explain steps in prevention and treatment of chronic complications; state importance of daily self-foot exams; describe how to examine feet and what to look for; explain appropriate eye and dental care  Lifestyle Changes/Goals & Health/Community Resources - state benefits of making appropriate lifestyle changes; identify habits that need to change (meals, tobacco, alcohol); identify strategies to reduce risk factors for personal health; set goals for proper diabetes care; state need for and frequency of healthcare follow-up; describe appropriate community resources for good health (ADA, web sites, apps)   Pregnancy/Sexual Health - define gestational diabetes; state importance of good blood glucose control and birth control prior to pregnancy; state importance of good blood glucose control in preventing sexual problems (impotence, vaginal dryness, infections, loss of desire); state relationship of blood glucose control and pregnancy outcome; describe risk of maternal and fetal complications  Teaching Materials Used: Meter-Contour Next  General Meal Planning Guidelines Daily Food Record Gestational Diabetes Booklet Gestational Video Goals for Healthy Pregnancy

## 2019-09-30 ENCOUNTER — Ambulatory Visit (INDEPENDENT_AMBULATORY_CARE_PROVIDER_SITE_OTHER): Payer: Medicaid Other | Admitting: Obstetrics and Gynecology

## 2019-09-30 ENCOUNTER — Other Ambulatory Visit: Payer: Self-pay

## 2019-09-30 ENCOUNTER — Encounter: Payer: Self-pay | Admitting: Obstetrics and Gynecology

## 2019-09-30 ENCOUNTER — Ambulatory Visit (INDEPENDENT_AMBULATORY_CARE_PROVIDER_SITE_OTHER): Payer: BLUE CROSS/BLUE SHIELD

## 2019-09-30 VITALS — BP 98/75 | HR 92 | Wt 272.7 lb

## 2019-09-30 DIAGNOSIS — Z3402 Encounter for supervision of normal first pregnancy, second trimester: Secondary | ICD-10-CM

## 2019-09-30 DIAGNOSIS — O24410 Gestational diabetes mellitus in pregnancy, diet controlled: Secondary | ICD-10-CM

## 2019-09-30 DIAGNOSIS — O24419 Gestational diabetes mellitus in pregnancy, unspecified control: Secondary | ICD-10-CM

## 2019-09-30 DIAGNOSIS — Z3A20 20 weeks gestation of pregnancy: Secondary | ICD-10-CM

## 2019-09-30 LAB — POCT URINALYSIS DIPSTICK OB
Bilirubin, UA: NEGATIVE
Blood, UA: NEGATIVE
Glucose, UA: NEGATIVE
Ketones, UA: 80
Nitrite, UA: NEGATIVE
POC,PROTEIN,UA: NEGATIVE
Spec Grav, UA: 1.02 (ref 1.010–1.025)
Urobilinogen, UA: NEGATIVE E.U./dL — AB
pH, UA: 5 (ref 5.0–8.0)

## 2019-09-30 MED ORDER — MICROLET LANCETS MISC: 1.0000 | Freq: Four times a day (QID) | 6 refills | Status: DC

## 2019-09-30 MED ORDER — CONTOUR NEXT TEST VI STRP: ORAL_STRIP | 12 refills | Status: DC

## 2019-09-30 NOTE — Progress Notes (Signed)
ROB- discussed GD diagnosis and finger sticks. Has follow up appointment w/ [facility] on 10/21, anatomy scan reviewed and incomplete, will plan follow up at next visit  Date of Service: 09/30/2019   Indications:Anatomy Ultrasound Findings:  Dominique Sanders intrauterine pregnancy is visualized with FHR at 137 BPM. Biometrics give an (U/S) Gestational age of [redacted]w[redacted]d and an (U/S) EDD of 02/18/2020; this correlates with the clinically established Estimated Date of Delivery: 02/16/20  Fetal presentation is Breech.  EFW: 313 g ( 11 oz). Placenta: posterior. Grade: 1 AFI: subjectively normal.  Anatomic survey is incomplete for Face, Nose/lip, Heart and normal; Gender - female.    Right Ovary is normal in appearance. Left Ovary is normal appearance. Survey of the adnexa demonstrates no adnexal masses. There is no free peritoneal fluid in the cul de sac.  Impression: 1. [redacted]w[redacted]d Viable Singleton Intrauterine pregnancy by U/S. 2. (U/S) EDD is consistent with Clinically established Estimated Date of Delivery: 02/16/20 . 3. Anatomic survey is incomplete for Face, Nose/lip, Heart  Do to body habitus and fetal position.

## 2019-09-30 NOTE — Patient Instructions (Signed)
Gestational Diabetes Mellitus, Diagnosis Gestational diabetes (gestational diabetes mellitus) is a short-term (temporary) form of diabetes that can happen during pregnancy. It goes away after you give birth. It may be caused by one or both of these problems:  Your pancreas does not make enough of a hormone called insulin.  Your body does not respond in a normal way to insulin that it makes. Insulin lets sugars (glucose) go into cells in the body. This gives you energy. If you have diabetes, sugars cannot get into cells. This causes high blood sugar (hyperglycemia). If you get gestational diabetes, you are:  More likely to get it if you get pregnant again.  More likely to develop type 2 diabetes in the future. If gestational diabetes is treated, it may not hurt you or your baby. Your doctor will set treatment goals for you. In general, you should have these blood sugar levels:  After not eating for a long time (fasting): 95 mg/dL (5.3 mmol/L).  After meals (postprandial): ? One hour after a meal: at or below 140 mg/dL (7.8 mmol/L). ? Two hours after a meal: at or below 120 mg/dL (6.7 mmol/L).  A1c (hemoglobin A1c) level: 6-6.5%. Follow these instructions at home: Questions to ask your doctor   You may want to ask these questions: ? Do I need to meet with a diabetes educator? ? What equipment will I need to care for myself at home? ? What medicines do I need? When should I take them? ? How often do I need to check my blood sugar? ? What number can I call if I have questions? ? When is my next doctor's visit? General instructions  Take over-the-counter and prescription medicines only as told by your doctor.  Stay at a healthy weight during pregnancy.  Keep all follow-up visits as told by your doctor. This is important. Contact a doctor if:  Your blood sugar is at or above 240 mg/dL (13.3 mmol/L).  Your blood sugar is at or above 200 mg/dL (11.1 mmol/L) and you have ketones in  your pee (urine).  You have been sick or have had a fever for 2 days or more and you are not getting better.  You have any of these problems for more than 6 hours: ? You cannot eat or drink. ? You feel sick to your stomach (nauseous). ? You throw up (vomit). ? You have watery poop (diarrhea). Get help right away if:  Your blood sugar is lower than 54 mg/dL (3 mmol/L).  You get confused.  You have trouble: ? Thinking clearly. ? Breathing.  Your baby moves less than normal.  You have any of these: ? Moderate or large ketone levels in your pee. ? Blood coming from your vagina. ? Unusual fluid coming from your vagina. ? Early contractions. These may feel like tightness in your belly. Summary  Gestational diabetes is a short-term form of diabetes. It can happen while you are pregnant. It goes away after you give birth.  If gestational diabetes is treated, it may not hurt you or your baby. Your doctor will set treatment goals for you.  Keep all follow-up visits as told by your doctor. This is important. This information is not intended to replace advice given to you by your health care provider. Make sure you discuss any questions you have with your health care provider. Document Released: 04/02/2016 Document Revised: 01/16/2018 Document Reviewed: 01/13/2016 Elsevier Patient Education  2020 [employer]  

## 2019-10-14 ENCOUNTER — Ambulatory Visit: Payer: BLUE CROSS/BLUE SHIELD | Admitting: Dietician

## 2019-10-15 ENCOUNTER — Telehealth: Payer: Self-pay | Admitting: Dietician

## 2019-10-15 ENCOUNTER — Ambulatory Visit (INDEPENDENT_AMBULATORY_CARE_PROVIDER_SITE_OTHER): Payer: BLUE CROSS/BLUE SHIELD | Admitting: Certified Nurse Midwife

## 2019-10-15 ENCOUNTER — Ambulatory Visit (INDEPENDENT_AMBULATORY_CARE_PROVIDER_SITE_OTHER): Payer: BLUE CROSS/BLUE SHIELD

## 2019-10-15 ENCOUNTER — Other Ambulatory Visit: Payer: Self-pay

## 2019-10-15 VITALS — BP 92/63 | HR 95 | Wt 279.7 lb

## 2019-10-15 DIAGNOSIS — Z3402 Encounter for supervision of normal first pregnancy, second trimester: Secondary | ICD-10-CM | POA: Diagnosis not present

## 2019-10-15 DIAGNOSIS — Z8632 Personal history of gestational diabetes: Secondary | ICD-10-CM | POA: Insufficient documentation

## 2019-10-15 DIAGNOSIS — O24419 Gestational diabetes mellitus in pregnancy, unspecified control: Secondary | ICD-10-CM

## 2019-10-15 DIAGNOSIS — O2602 Excessive weight gain in pregnancy, second trimester: Secondary | ICD-10-CM

## 2019-10-15 DIAGNOSIS — Z6841 Body Mass Index (BMI) 40.0 and over, adult: Secondary | ICD-10-CM

## 2019-10-15 DIAGNOSIS — Z3492 Encounter for supervision of normal pregnancy, unspecified, second trimester: Secondary | ICD-10-CM

## 2019-10-15 DIAGNOSIS — Z3A22 22 weeks gestation of pregnancy: Secondary | ICD-10-CM

## 2019-10-15 DIAGNOSIS — O2600 Excessive weight gain in pregnancy, unspecified trimester: Secondary | ICD-10-CM

## 2019-10-15 DIAGNOSIS — O99210 Obesity complicating pregnancy, unspecified trimester: Secondary | ICD-10-CM

## 2019-10-15 DIAGNOSIS — O99212 Obesity complicating pregnancy, second trimester: Secondary | ICD-10-CM

## 2019-10-15 HISTORY — DX: Personal history of gestational diabetes: Z86.32

## 2019-10-15 LAB — POCT URINALYSIS DIPSTICK OB
Bilirubin, UA: NEGATIVE
Glucose, UA: NEGATIVE
Ketones, UA: NEGATIVE
Nitrite, UA: NEGATIVE
Spec Grav, UA: 1.015 (ref 1.010–1.025)
Urobilinogen, UA: 0.2 E.U./dL
pH, UA: 6.5 (ref 5.0–8.0)

## 2019-10-15 NOTE — Progress Notes (Signed)
ROB-Doing well, no questions or concerns. Anatomy scan completed and normal; see results below. Reports "normal" blood sugar levels, but "lost log" and didn't bring meter. Advised to send results via [place]. Discussed TWG at this point in pregnancy is 17 lbs (greater than 5-10 lbs) with BMI 51 in setting of GDM, no longer a candidate for midwifery care. Discussed transfer to MD care, follow up with [facility] 11/06 as scheduled, and repeat anesthesia consult at 34 wks. Patient agreed with plan of care and denied questions. Reviewed red flag symptoms and when to call. RTC x 2 weeks for blood sugar log review and ROB with MD or sooner if needed.   ULTRASOUND REPORT  Location: [facility] Date of Service: 10/15/2019   Indications: Anatomy follow up ultrasound Findings:  Singleton intrauterine pregnancy is visualized with FHR at 141 BPM.  Fetal presentation is Cephalic.  Placenta: anterior. Grade: 1 AFI: subjectively normal.  Anatomic survey is complete.   There is no free peritoneal fluid in the cul de sac.  Impression: 1. [redacted]w[redacted]d Viable Singleton Intrauterine pregnancy previously established criteria. 2. Normal Anatomy Scan is now complete  Recommendations: 1.Clinical correlation with the patient's History and Physical Exam.

## 2019-10-15 NOTE — Progress Notes (Signed)
ROB-No complaints. Patient lost blood sugar log but states reading have been normal.

## 2019-10-15 NOTE — Telephone Encounter (Signed)
Called patient to reschedule her missed appointment from 10/14/19; she requests a Friday appointment; rescheduled for 10/30/19 at 1:15pm.

## 2019-10-15 NOTE — Patient Instructions (Addendum)
Gestational Diabetes Mellitus, Self Care When you have gestational diabetes (gestational diabetes mellitus), you must make sure your blood sugar (glucose) stays in a healthy range. You can do this with:  Nutrition.  Exercise.  Lifestyle changes.  Medicines or insulin, if needed.  Support from your doctors and others. If you get treated for this condition, it may not hurt you or your unborn baby (fetus). If you do not get treated for this condition, it may cause problems that can hurt you or your unborn baby. If you get gestational diabetes, you are:  More likely to get it if you get pregnant again.  More likely to develop type 2 diabetes in the future. How to stay aware of blood sugar   Check your blood sugar every day while you are pregnant. Check it as often as told.  Call your doctor if your blood sugar is above your goal numbers for two tests in a row. Your doctor will set personal treatment goals for you. Generally, you should have these blood sugar levels:  Before meals, or after not eating for a long time (fasting or preprandial): at or below 95 mg/dL (5.3 mmol/L).  After meals (postprandial): ? One hour after a meal: at or below 140 mg/dL (7.8 mmol/L). ? Two hours after a meal: at or below 120 mg/dL (6.7 mmol/L).  A1c (hemoglobin A1c) level: 6-6.5%. How to manage high and low blood sugar Signs of high blood sugar High blood sugar is called hyperglycemia. Know the early signs of high blood sugar. Signs may include:  Feeling: ? Thirsty. ? Hungry. ? Very tired.  Needing to pee (urinate) more than usual.  Blurry vision. Signs of low blood sugar Low blood sugar is called hypoglycemia. This is when blood sugar is at or below 70 mg/dL (3.9 mmol/L). Signs may include:  Feeling: ? Hungry. ? Worried or nervous (anxious). ? Sweaty and clammy. ? Confused. ? Dizzy. ? Sleepy. ? Sick to your stomach (nauseous).  Having: ? A fast heartbeat. ? A headache. ? A change  in your vision. ? Tingling or no feeling (numbness) around your mouth, lips, or tongue. ? Jerky movements that you cannot control (seizure).  Having trouble with: ? Moving (coordination). ? Sleeping. ? Passing out (fainting). ? Getting upset easily (irritability). Treating low blood sugar To treat low blood sugar, eat or drink something sugary right away. If you can think clearly and swallow safely, follow the 15:15 rule:  Take 15 grams of a fast-acting carb (carbohydrate). Talk with your doctor about how much you should take.  Some fast-acting carbs are: ? Sugar tablets (glucose pills). Take 3-4 glucose pills. ? 6-8 pieces of hard candy. ? 4-6 oz (120-150 mL) of fruit juice. ? 4-6 oz (120-150 mL) of regular (not diet) soda. ? 1 Tbsp (15 mL) honey or sugar.  Check your blood sugar 15 minutes after you take the carb.  If your blood sugar is still at or below 70 mg/dL (3.9 mmol/L), take 15 grams of a carb again.  If your blood sugar does not go above 70 mg/dL (3.9 mmol/L) after 3 tries, get help right away.  After your blood sugar goes back to normal, eat a meal or a snack within 1 hour. Treating very low blood sugar If your blood sugar is at or below 54 mg/dL (3 mmol/L), you have very low blood sugar (severe hypoglycemia). This is an emergency. Do not wait to see if the symptoms will go away. Get medical help right  away. Call your local emergency services (911 in the [place] If you have very low blood sugar and you cannot eat or drink, you may need a glucagon shot (injection). A family member or friend should learn how to check your blood sugar and how to give you a glucagon shot. Ask your doctor if you need to have a glucagon shot kit at home. Follow these instructions at home: Medicine  Take your insulin and diabetes medicines as told.  If your doctor says you should take more or less insulin or medicines, do this exactly as told.  Do not run out of insulin or medicines. Food    Make healthy food choices. These include: ? Chicken, fish, egg whites, and beans. ? Oats, whole wheat, bulgur, brown rice, quinoa, and millet. ? Fresh fruits and vegetables. ? Low-fat dairy products. ? Nuts, avocado, olive oil, and canola oil.  Meet with a food specialist (dietitian). He or she can help you make an eating plan that is right for you.  Follow instructions from your doctor about what you cannot eat or drink.  Drink enough fluid to keep your pee (urine) pale yellow.  Eat healthy snacks between healthy meals.  Keep track of carbs that you eat. Do this by reading food labels and learning food serving sizes.  Follow your sick day plan when you cannot eat or drink normally. Make this plan with your doctor so it is ready to use. Activity  Exercise for 30 or more minutes a day, or as much as your doctor recommends.  Talk with your doctor before you start a new exercise or activity. Your doctor may need to tell you to change: ? How much insulin or medicines you take. ? How much food you eat. Lifestyle  Do not drink alcohol.  Do not use any tobacco products. These include cigarettes, chewing tobacco, and e-cigarettes. If you need help quitting, ask your doctor.  Learn how to deal with stress. If you need help with this, ask your doctor. Body care  Stay up to date with your shots (immunizations).  Brush your teeth and gums two times a day. Floss one or more times a day.  Go to the dentist one or more times every 6 months.  Stay at a healthy weight while you are pregnant. General instructions  Take over-the-counter and prescription medicines only as told by your doctor.  Ask your doctor about risks of high blood pressure in pregnancy (preeclampsia and eclampsia).  Share your diabetes care plan with: ? Your work or school. ? People you live with.  Check your pee for ketones: ? When you are sick. ? As told by your doctor.  Carry a card or wear jewelry that  says you have diabetes.  Keep all follow-up visits as told by your doctor. This is important. Care after giving birth  Have your blood sugar checked 4-12 weeks after you give birth.  Get checked for diabetes one or more times during 3 years. Questions to ask your doctor  Do I need to meet with a diabetes educator?  Where can I find a support group for people with gestational diabetes? Where to find more information To learn more about diabetes, visit:  American Diabetes Association: www.diabetes.org  Centers for Disease Control and Prevention (CDC): http://www.wolf.info/ Summary  Check your blood sugar (glucose) every day while you are pregnant. Check it as often as told.  Take your insulin and diabetes medicines as told.  Keep all follow-up visits as  told by your doctor. This is important.  Have your blood sugar checked 4-12 weeks after you give birth. This information is not intended to replace advice given to you by your health care provider. Make sure you discuss any questions you have with your health care provider. Document Released: 04/02/2016 Document Revised: 06/02/2018 Document Reviewed: 01/13/2016 Elsevier Patient Education  [place]. Gestational Diabetes Mellitus, Diagnosis Gestational diabetes (gestational diabetes mellitus) is a short-term (temporary) form of diabetes that can happen during pregnancy. It goes away after you give birth. It may be caused by one or both of these problems:  Your pancreas does not make enough of a hormone called insulin.  Your body does not respond in a normal way to insulin that it makes. Insulin lets sugars (glucose) go into cells in the body. This gives you energy. If you have diabetes, sugars cannot get into cells. This causes high blood sugar (hyperglycemia). If you get gestational diabetes, you are:  More likely to get it if you get pregnant again.  More likely to develop type 2 diabetes in the future. If gestational diabetes  is treated, it may not hurt you or your baby. Your doctor will set treatment goals for you. In general, you should have these blood sugar levels:  After not eating for a long time (fasting): 95 mg/dL (5.3 mmol/L).  After meals (postprandial): ? One hour after a meal: at or below 140 mg/dL (7.8 mmol/L). ? Two hours after a meal: at or below 120 mg/dL (6.7 mmol/L).  A1c (hemoglobin A1c) level: 6-6.5%. Follow these instructions at home: Questions to ask your doctor   You may want to ask these questions: ? Do I need to meet with a diabetes educator? ? What equipment will I need to care for myself at home? ? What medicines do I need? When should I take them? ? How often do I need to check my blood sugar? ? What number can I call if I have questions? ? When is my next doctor's visit? General instructions  Take over-the-counter and prescription medicines only as told by your doctor.  Stay at a healthy weight during pregnancy.  Keep all follow-up visits as told by your doctor. This is important. Contact a doctor if:  Your blood sugar is at or above 240 mg/dL (13.3 mmol/L).  Your blood sugar is at or above 200 mg/dL (11.1 mmol/L) and you have ketones in your pee (urine).  You have been sick or have had a fever for 2 days or more and you are not getting better.  You have any of these problems for more than 6 hours: ? You cannot eat or drink. ? You feel sick to your stomach (nauseous). ? You throw up (vomit). ? You have watery poop (diarrhea). Get help right away if:  Your blood sugar is lower than 54 mg/dL (3 mmol/L).  You get confused.  You have trouble: ? Thinking clearly. ? Breathing.  Your baby moves less than normal.  You have any of these: ? Moderate or large ketone levels in your pee. ? Blood coming from your vagina. ? Unusual fluid coming from your vagina. ? Early contractions. These may feel like tightness in your belly. Summary  Gestational diabetes is a  short-term form of diabetes. It can happen while you are pregnant. It goes away after you give birth.  If gestational diabetes is treated, it may not hurt you or your baby. Your doctor will set treatment goals for you.  Keep all follow-up visits as told by your doctor. This is important. This information is not intended to replace advice given to you by your health care provider. Make sure you discuss any questions you have with your health care provider. Document Released: 04/02/2016 Document Revised: 01/16/2018 Document Reviewed: 01/13/2016 Elsevier Patient Education  2020 [place] for Pregnant Women While you are pregnant, your body requires additional nutrition to help support your growing baby. You also have a higher need for some vitamins and minerals, such as folic acid, calcium, iron, and vitamin D. Eating a healthy, well-balanced diet is very important for your health and your baby's health. Your need for extra calories varies for the three 10-monthsegments of your pregnancy (trimesters). For most women, it is recommended to consume:  150 extra calories a day during the first trimester.  300 extra calories a day during the second trimester.  300 extra calories a day during the third trimester. What are tips for following this plan?   Do not try to lose weight or go on a diet during pregnancy.  Limit your overall intake of foods that have "empty calories." These are foods that have little nutritional value, such as sweets, desserts, candies, and sugar-sweetened beverages.  Eat a variety of foods (especially fruits and vegetables) to get a full range of vitamins and minerals.  Take a prenatal vitamin to help meet your additional vitamin and mineral needs during pregnancy, specifically for folic acid, iron, calcium, and vitamin D.  Remember to stay active. Ask your health care provider what types of exercise and activities are safe for you.  Practice good food  safety and cleanliness. Wash your hands before you eat and after you prepare raw meat. Wash all fruits and vegetables well before peeling or eating. Taking these actions can help to prevent food-borne illnesses that can be very dangerous to your baby, such as listeriosis. Ask your health care provider for more information about listeriosis. What does 150 extra calories look like? Healthy options that provide 150 extra calories each day could be any of the following:  6-8 oz (170-230 g) of plain low-fat yogurt with  cup of berries.  1 apple with 2 teaspoons (11 g) of peanut butter.  Cut-up vegetables with  cup (60 g) of hummus.  8 oz (230 mL) or 1 cup of low-fat chocolate milk.  1 stick of string cheese with 1 medium orange.  1 peanut butter and jelly sandwich that is made with one slice of whole-wheat bread and 1 tsp (5 g) of peanut butter. For 300 extra calories, you could eat two of those healthy options each day. What is a healthy amount of weight to gain? The right amount of weight gain for you is based on your BMI before you became pregnant. If your BMI:  Was less than 18 (underweight), you should gain 28-40 lb (13-18 kg).  Was 18-24.9 (normal), you should gain 25-35 lb (11-16 kg).  Was 25-29.9 (overweight), you should gain 15-25 lb (7-11 kg).  Was 30 or greater (obese), you should gain 11-20 lb (5-9 kg). What if I am having twins or multiples? Generally, if you are carrying twins or multiples:  You may need to eat 300-600 extra calories a day.  The recommended range for total weight gain is 25-54 lb (11-25 kg), depending on your BMI before pregnancy.  Talk with your health care provider to find out about nutritional needs, weight gain, and exercise that is right  for you. What foods can I eat?  Grains All grains. Choose whole grains, such as whole-wheat bread, oatmeal, or brown rice. Vegetables All vegetables. Eat a variety of colors and types of vegetables. Remember to  wash your vegetables well before peeling or eating. Fruits All fruits. Eat a variety of colors and types of fruit. Remember to wash your fruits well before peeling or eating. Meats and other protein foods Lean meats, including chicken, [place], fish, and lean cuts of beef, veal, or pork. If you eat fish or seafood, choose options that are higher in omega-3 fatty acids and lower in mercury, such as salmon, herring, mussels, trout, sardines, pollock, shrimp, crab, and lobster. Tofu. Tempeh. Beans. Eggs. Peanut butter and other nut butters. Make sure that all meats, poultry, and eggs are cooked to food-safe temperatures or "well-done." Two or more servings of fish are recommended each week in order to get the most benefits from omega-3 fatty acids that are found in seafood. Choose fish that are lower in mercury. You can find more information online:  [employer]Gaming.ch Dairy Pasteurized milk and milk alternatives (such as almond milk). Pasteurized yogurt and pasteurized cheese. Cottage cheese. Sour cream. Beverages Water. Juices that contain 100% fruit juice or vegetable juice. Caffeine-free teas and decaffeinated coffee. Drinks that contain caffeine are okay to drink, but it is better to avoid caffeine. Keep your total caffeine intake to less than 200 mg each day (which is 12 oz or 355 mL of coffee, tea, or soda) or the limit as told by your health care provider. Fats and oils Fats and oils are okay to include in moderation. Sweets and desserts Sweets and desserts are okay to include in moderation. Seasoning and other foods All pasteurized condiments. The items listed above may not be a complete list of recommended foods and beverages. Contact your dietitian for more options. The items listed above may not be a complete list of foods and beverages [you/your child] can eat. Contact a dietitian for more information. What foods are not recommended? Vegetables Raw (unpasteurized) vegetable juices. Fruits  Unpasteurized fruit juices. Meats and other protein foods Lunch meats, bologna, hot dogs, or other deli meats. (If you must eat those meats, reheat them until they are steaming hot.) Refrigerated pat, meat spreads from a meat counter, smoked seafood that is found in the refrigerated section of a store. Raw or undercooked meats, poultry, and eggs. Raw fish, such as sushi or sashimi. Fish that have high mercury content, such as tilefish, shark, swordfish, and king mackerel. To learn more about mercury in fish, talk with your health care provider or look for online resources, such as:  [employer].ch Dairy Raw (unpasteurized) milk and any foods that have raw milk in them. Soft cheeses, such as feta, queso blanco, queso fresco, Brie, Camembert cheeses, blue-veined cheeses, and Panela cheese (unless it is made with pasteurized milk, which must be stated on the label). Beverages Alcohol. Sugar-sweetened beverages, such as sodas, teas, or energy drinks. Seasoning and other foods Homemade fermented foods and drinks, such as pickles, sauerkraut, or kombucha drinks. (Store-bought pasteurized versions of these are okay.) Salads that are made in a store or deli, such as ham salad, chicken salad, egg salad, tuna salad, and seafood salad. The items listed above may not be a complete list of foods and beverages to avoid. Contact your dietitian for more information. The items listed above may not be a complete list of foods and beverages [you/your child] should avoid. Contact a dietitian for more  information. Where to find more information To calculate the number of calories you need based on your height, weight, and activity level, you can use an online calculator such as:  MobileTransition.ch To calculate how much weight you should gain during pregnancy, you can use an online pregnancy weight gain calculator such as:  StreamingFood.com.cy Summary  While you  are pregnant, your body requires additional nutrition to help support your growing baby.  Eat a variety of foods, especially fruits and vegetables to get a full range of vitamins and minerals.  Practice good food safety and cleanliness. Wash your hands before you eat and after you prepare raw meat. Wash all fruits and vegetables well before peeling or eating. Taking these actions can help to prevent food-borne illnesses, such as listeriosis, that can be very dangerous to your baby.  Do not eat raw meat or fish. Do not eat fish that have high mercury content, such as tilefish, shark, swordfish, and king mackerel. Do not eat unpasteurized (raw) dairy.  Take a prenatal vitamin to help meet your additional vitamin and mineral needs during pregnancy, specifically for folic acid, iron, calcium, and vitamin D. This information is not intended to replace advice given to you by your health care provider. Make sure you discuss any questions you have with your health care provider. Document Released: 09/24/2014 Document Revised: 04/02/2019 Document Reviewed: 09/06/2017 Elsevier Patient Education  2020 [employer].

## 2019-10-27 ENCOUNTER — Other Ambulatory Visit: Payer: Self-pay | Admitting: Certified Nurse Midwife

## 2019-10-29 ENCOUNTER — Encounter: Payer: BLUE CROSS/BLUE SHIELD | Admitting: Obstetrics and Gynecology

## 2019-10-30 ENCOUNTER — Other Ambulatory Visit: Payer: Self-pay

## 2019-10-30 ENCOUNTER — Encounter: Payer: Self-pay | Admitting: Obstetrics and Gynecology

## 2019-10-30 ENCOUNTER — Ambulatory Visit (INDEPENDENT_AMBULATORY_CARE_PROVIDER_SITE_OTHER): Payer: BLUE CROSS/BLUE SHIELD | Admitting: Obstetrics and Gynecology

## 2019-10-30 ENCOUNTER — Ambulatory Visit: Payer: BLUE CROSS/BLUE SHIELD | Admitting: Dietician

## 2019-10-30 VITALS — BP 103/71 | HR 99 | Ht 62.0 in | Wt 279.1 lb

## 2019-10-30 DIAGNOSIS — Z3A24 24 weeks gestation of pregnancy: Secondary | ICD-10-CM

## 2019-10-30 DIAGNOSIS — O99210 Obesity complicating pregnancy, unspecified trimester: Secondary | ICD-10-CM

## 2019-10-30 DIAGNOSIS — O99212 Obesity complicating pregnancy, second trimester: Secondary | ICD-10-CM

## 2019-10-30 DIAGNOSIS — O24410 Gestational diabetes mellitus in pregnancy, diet controlled: Secondary | ICD-10-CM

## 2019-10-30 DIAGNOSIS — O24419 Gestational diabetes mellitus in pregnancy, unspecified control: Secondary | ICD-10-CM

## 2019-10-30 DIAGNOSIS — Z3492 Encounter for supervision of normal pregnancy, unspecified, second trimester: Secondary | ICD-10-CM

## 2019-10-30 LAB — POCT URINALYSIS DIPSTICK OB
Bilirubin, UA: NEGATIVE
Glucose, UA: NEGATIVE
Ketones, UA: NEGATIVE
Nitrite, UA: NEGATIVE
POC,PROTEIN,UA: NEGATIVE
Spec Grav, UA: 1.01 (ref 1.010–1.025)
Urobilinogen, UA: 0.2 E.U./dL
pH, UA: 7 (ref 5.0–8.0)

## 2019-10-30 NOTE — Progress Notes (Signed)
Patient comes in today for  ROB visit.  

## 2019-10-30 NOTE — Progress Notes (Signed)
ROB: Patient has no complaints.  She did not bring her sugar log but showed me a picture of it on her phone.  She had several high fasting sugars but most of her postprandials were normal.  She has not yet seen the diabetic educator and changed her diet.  We have discussed continuation of the glucose log and that she should bring it to each visit.  She will come back in 2 weeks for review of her glucose log once she has changed her diet and had her diabetic education.  Consideration for glyburide as necessary if fastings remain elevated.  Urine culture sent.  Patient reports daily fetal movement.

## 2019-11-04 ENCOUNTER — Encounter: Payer: Self-pay | Admitting: Dietician

## 2019-11-04 NOTE — Progress Notes (Signed)
Have not heard back from patient to reschedule her second missed visit on 10/30/19. Sent notification to referring provider.

## 2019-11-13 ENCOUNTER — Encounter: Payer: BLUE CROSS/BLUE SHIELD | Admitting: Obstetrics and Gynecology

## 2019-11-26 ENCOUNTER — Ambulatory Visit (INDEPENDENT_AMBULATORY_CARE_PROVIDER_SITE_OTHER): Payer: BLUE CROSS/BLUE SHIELD | Admitting: Obstetrics and Gynecology

## 2019-11-26 ENCOUNTER — Encounter: Payer: Self-pay | Admitting: Obstetrics and Gynecology

## 2019-11-26 ENCOUNTER — Other Ambulatory Visit: Payer: Self-pay

## 2019-11-26 VITALS — BP 92/56 | HR 94 | Wt 286.6 lb

## 2019-11-26 DIAGNOSIS — Z23 Encounter for immunization: Secondary | ICD-10-CM

## 2019-11-26 DIAGNOSIS — O0993 Supervision of high risk pregnancy, unspecified, third trimester: Secondary | ICD-10-CM

## 2019-11-26 DIAGNOSIS — O24419 Gestational diabetes mellitus in pregnancy, unspecified control: Secondary | ICD-10-CM

## 2019-11-26 DIAGNOSIS — O99210 Obesity complicating pregnancy, unspecified trimester: Secondary | ICD-10-CM

## 2019-11-26 DIAGNOSIS — Z130 Encounter for screening for diseases of the blood and blood-forming organs and certain disorders involving the immune mechanism: Secondary | ICD-10-CM

## 2019-11-26 DIAGNOSIS — O26843 Uterine size-date discrepancy, third trimester: Secondary | ICD-10-CM

## 2019-11-26 LAB — POCT URINALYSIS DIPSTICK OB
Bilirubin, UA: NEGATIVE
Blood, UA: NEGATIVE
Ketones, UA: NEGATIVE
Leukocytes, UA: NEGATIVE
Nitrite, UA: NEGATIVE
POC,PROTEIN,UA: NEGATIVE
Spec Grav, UA: 1.01 (ref 1.010–1.025)
Urobilinogen, UA: 0.2 E.U./dL
pH, UA: 6.5 (ref 5.0–8.0)

## 2019-11-26 NOTE — Patient Instructions (Signed)
Gestational Diabetes Mellitus, Self Care When you have gestational diabetes (gestational diabetes mellitus), you must make sure your blood sugar (glucose) stays in a healthy range. You can do this with:  Nutrition.  Exercise.  Lifestyle changes.  Medicines or insulin, if needed.  Support from your doctors and others. If you get treated for this condition, it may not hurt you or your unborn baby (fetus). If you do not get treated for this condition, it may cause problems that can hurt you or your unborn baby. If you get gestational diabetes, you are:  More likely to get it if you get pregnant again.  More likely to develop type 2 diabetes in the future. How to stay aware of blood sugar   Check your blood sugar every day while you are pregnant. Check it as often as told.  Call your doctor if your blood sugar is above your goal numbers for two tests in a row. Your doctor will set personal treatment goals for you. Generally, you should have these blood sugar levels:  Before meals, or after not eating for a long time (fasting or preprandial): at or below 95 mg/dL (5.3 mmol/L).  After meals (postprandial): ? One hour after a meal: at or below 140 mg/dL (7.8 mmol/L). ? Two hours after a meal: at or below 120 mg/dL (6.7 mmol/L).  A1c (hemoglobin A1c) level: 6-6.5%. How to manage high and low blood sugar Signs of high blood sugar High blood sugar is called hyperglycemia. Know the early signs of high blood sugar. Signs may include:  Feeling: ? Thirsty. ? Hungry. ? Very tired.  Needing to pee (urinate) more than usual.  Blurry vision. Signs of low blood sugar Low blood sugar is called hypoglycemia. This is when blood sugar is at or below 70 mg/dL (3.9 mmol/L). Signs may include:  Feeling: ? Hungry. ? Worried or nervous (anxious). ? Sweaty and clammy. ? Confused. ? Dizzy. ? Sleepy. ? Sick to your stomach (nauseous).  Having: ? A fast heartbeat. ? A headache. ? A change  in your vision. ? Tingling or no feeling (numbness) around your mouth, lips, or tongue. ? Jerky movements that you cannot control (seizure).  Having trouble with: ? Moving (coordination). ? Sleeping. ? Passing out (fainting). ? Getting upset easily (irritability). Treating low blood sugar To treat low blood sugar, eat or drink something sugary right away. If you can think clearly and swallow safely, follow the 15:15 rule:  Take 15 grams of a fast-acting carb (carbohydrate). Talk with your doctor about how much you should take.  Some fast-acting carbs are: ? Sugar tablets (glucose pills). Take 3-4 glucose pills. ? 6-8 pieces of hard candy. ? 4-6 oz (120-150 mL) of fruit juice. ? 4-6 oz (120-150 mL) of regular (not diet) soda. ? 1 Tbsp (15 mL) honey or sugar.  Check your blood sugar 15 minutes after you take the carb.  If your blood sugar is still at or below 70 mg/dL (3.9 mmol/L), take 15 grams of a carb again.  If your blood sugar does not go above 70 mg/dL (3.9 mmol/L) after 3 tries, get help right away.  After your blood sugar goes back to normal, eat a meal or a snack within 1 hour. Treating very low blood sugar If your blood sugar is at or below 54 mg/dL (3 mmol/L), you have very low blood sugar (severe hypoglycemia). This is an emergency. Do not wait to see if the symptoms will go away. Get medical help right  away. Call your local emergency services (911 in the [place] If you have very low blood sugar and you cannot eat or drink, you may need a glucagon shot (injection). A family member or friend should learn how to check your blood sugar and how to give you a glucagon shot. Ask your doctor if you need to have a glucagon shot kit at home. Follow these instructions at home: Medicine  Take your insulin and diabetes medicines as told.  If your doctor says you should take more or less insulin or medicines, do this exactly as told.  Do not run out of insulin or  medicines. Food   Make healthy food choices. These include: ? Chicken, fish, egg whites, and beans. ? Oats, whole wheat, bulgur, brown rice, quinoa, and millet. ? Fresh fruits and vegetables. ? Low-fat dairy products. ? Nuts, avocado, olive oil, and canola oil.  Meet with a food specialist (dietitian). He or she can help you make an eating plan that is right for you.  Follow instructions from your doctor about what you cannot eat or drink.  Drink enough fluid to keep your pee (urine) pale yellow.  Eat healthy snacks between healthy meals.  Keep track of carbs that you eat. Do this by reading food labels and learning food serving sizes.  Follow your sick day plan when you cannot eat or drink normally. Make this plan with your doctor so it is ready to use. Activity  Exercise for 30 or more minutes a day, or as much as your doctor recommends.  Talk with your doctor before you start a new exercise or activity. Your doctor may need to tell you to change: ? How much insulin or medicines you take. ? How much food you eat. Lifestyle  Do not drink alcohol.  Do not use any tobacco products. These include cigarettes, chewing tobacco, and e-cigarettes. If you need help quitting, ask your doctor.  Learn how to deal with stress. If you need help with this, ask your doctor. Body care  Stay up to date with your shots (immunizations).  Brush your teeth and gums two times a day. Floss one or more times a day.  Go to the dentist one or more times every 6 months.  Stay at a healthy weight while you are pregnant. General instructions  Take over-the-counter and prescription medicines only as told by your doctor.  Ask your doctor about risks of high blood pressure in pregnancy (preeclampsia and eclampsia).  Share your diabetes care plan with: ? Your work or school. ? People you live with.  Check your pee for ketones: ? When you are sick. ? As told by your doctor.  Carry a card or  wear jewelry that says you have diabetes.  Keep all follow-up visits as told by your doctor. This is important. Care after giving birth  Have your blood sugar checked 4-12 weeks after you give birth.  Get checked for diabetes one or more times during 3 years. Questions to ask your doctor  Do I need to meet with a diabetes educator?  Where can I find a support group for people with gestational diabetes? Where to find more information To learn more about diabetes, visit:  American Diabetes Association: www.diabetes.org  Centers for Disease Control and Prevention (CDC): www.cdc.gov Summary  Check your blood sugar (glucose) every day while you are pregnant. Check it as often as told.  Take your insulin and diabetes medicines as told.  Keep all follow-up visits as   told by your doctor. This is important.  Have your blood sugar checked 4-12 weeks after you give birth. This information is not intended to replace advice given to you by your health care provider. Make sure you discuss any questions you have with your health care provider. Document Released: 04/02/2016 Document Revised: 06/02/2018 Document Reviewed: 01/13/2016 Elsevier Patient Education  2020 Elsevier Inc.  

## 2019-11-26 NOTE — Progress Notes (Signed)
Patient comes in today for [place] appointment. Patient desires to bottle feed. Patient has not been keeping log of BS.

## 2019-11-27 ENCOUNTER — Telehealth: Payer: Self-pay

## 2019-11-27 ENCOUNTER — Encounter: Payer: Self-pay | Admitting: Obstetrics and Gynecology

## 2019-11-27 MED ORDER — GLYBURIDE 5 MG PO TABS: 5.0000 mg | ORAL_TABLET | Freq: Two times a day (BID) | ORAL | 4 refills | Status: DC

## 2019-11-27 NOTE — Telephone Encounter (Signed)
Pt called to inform that her medication was at the pharmacy for pick up and to make sure she take her blood sugar machine with her and check it as prescribed.

## 2019-11-27 NOTE — Progress Notes (Signed)
ROB: Patient has no major complaints today.  She still has not yet seen the diabetic educator. Notes her mother is trying to help her "eat a little better", mostly baked meats. Did not bring sugar log again today, notes she has not been regularly assessing her blood sugars.  Fasting blood sugar 168 today. Discussed again risks of uncontrolled diabetes on the fetus, risk of requiring C-section as mode of delivery due to fetal size, need for earlier delivery due to diabetes. Assessed barriers to monitoring blood sugars, notes she just does not like to be stuck. Offered patient compromise of checking blood sugars 2 x daily (fasting and 2 hrs postprandial lunch) instead of 4 x daily, and gradually working up. Also advised on keeping her glucose monitor in her purse at all times, setting alarms to check blood sugars, etc. Glyburide 5 mg BID ordered. Will check HgbA1c. Patient also due for RPR and CBC. RTC in 1 week to reassess blood sugars on Glyburide. Will also order growth scan as size>dates today in setting of uncontrolled GDM.    Dominique Sanders reports that she desires to have permanent sterilization. Discussed that risk of regret is high as she is young, unmarried, and this is her first child. Discussed LARC contraception, initially desired Nexplanon, however discussed that due to BMI, may be less effective and more likely to have side effects such as abnormal bleeding. Patient may consider Mirena IUD. Given handout. Blood consent signed. Patient desires to bottle feed.

## 2019-11-30 ENCOUNTER — Telehealth: Payer: Self-pay | Admitting: Obstetrics and Gynecology

## 2019-12-01 ENCOUNTER — Ambulatory Visit (INDEPENDENT_AMBULATORY_CARE_PROVIDER_SITE_OTHER): Payer: BLUE CROSS/BLUE SHIELD

## 2019-12-01 ENCOUNTER — Other Ambulatory Visit: Payer: Self-pay

## 2019-12-01 DIAGNOSIS — Z362 Encounter for other antenatal screening follow-up: Secondary | ICD-10-CM | POA: Diagnosis not present

## 2019-12-01 DIAGNOSIS — O99210 Obesity complicating pregnancy, unspecified trimester: Secondary | ICD-10-CM

## 2019-12-01 DIAGNOSIS — O24419 Gestational diabetes mellitus in pregnancy, unspecified control: Secondary | ICD-10-CM

## 2019-12-01 DIAGNOSIS — O26843 Uterine size-date discrepancy, third trimester: Secondary | ICD-10-CM

## 2019-12-02 ENCOUNTER — Ambulatory Visit (INDEPENDENT_AMBULATORY_CARE_PROVIDER_SITE_OTHER): Payer: BLUE CROSS/BLUE SHIELD | Admitting: Obstetrics and Gynecology

## 2019-12-02 ENCOUNTER — Encounter: Payer: Self-pay | Admitting: Obstetrics and Gynecology

## 2019-12-02 VITALS — BP 108/70 | HR 101 | Wt 282.1 lb

## 2019-12-02 DIAGNOSIS — O0993 Supervision of high risk pregnancy, unspecified, third trimester: Secondary | ICD-10-CM

## 2019-12-02 DIAGNOSIS — O24415 Gestational diabetes mellitus in pregnancy, controlled by oral hypoglycemic drugs: Secondary | ICD-10-CM

## 2019-12-02 DIAGNOSIS — O99210 Obesity complicating pregnancy, unspecified trimester: Secondary | ICD-10-CM

## 2019-12-02 LAB — POCT URINALYSIS DIPSTICK OB
Bilirubin, UA: NEGATIVE
Blood, UA: NEGATIVE
Glucose, UA: NEGATIVE
Ketones, UA: NEGATIVE
Nitrite, UA: NEGATIVE
Spec Grav, UA: 1.025 (ref 1.010–1.025)
Urobilinogen, UA: 0.2 E.U./dL
pH, UA: 6.5 (ref 5.0–8.0)

## 2019-12-02 NOTE — Progress Notes (Signed)
ROB-Pt present for routine prenatal care. Pt denies any issues with swollen feet, hands or face. UA had +2 protein. Pt stated that she was doing well. 4

## 2019-12-03 NOTE — Progress Notes (Signed)
ROB: Patient returns today for evaluation of blood sugar log.  Started Glyburide 5 mg BID last Friday.  Notes that her blood sugars have improved since starting the medication.  Last 3 days noting 80s-90s fasting and 100s-110s recorded on log. postprandial lunch (only checking blood sugars 2 times per day as per agreement).  Reviewed glucose meter. Levels range from 80s-160s fasting and 90s-200 postprandial. Increased glyburide to 7.5 mg BID. Strongly encouraged better compliance with diet. Patient now agrees to checking blood sugar 4 x daily. S/p normal growth ultrasound yesterday. Repeat in 4 weeks.  Discussed need for antenatal testing beginning at 32 weeks. Patient to return in 1 week.

## 2019-12-08 NOTE — Telephone Encounter (Signed)
na

## 2019-12-15 ENCOUNTER — Encounter: Payer: BLUE CROSS/BLUE SHIELD | Admitting: Obstetrics and Gynecology

## 2019-12-25 NOTE — L&D Delivery Note (Addendum)
Delivery Summary for Dominique Sanders  Labor Events:   Preterm labor: No data found  Rupture date: 01/28/2020  Rupture time: 8:07 AM  Rupture type: Artificial Intact  Fluid Color: Bloody Other  Induction: No data found  Augmentation: No data found  Complications: No data found  Cervical ripening: No data found No data found   No data found     Delivery:   Episiotomy: No data found  Lacerations: No data found  Repair suture: No data found  Repair # of packets: No data found  Blood loss (ml): 650   Information for the patient's newborn:  Tamorah, Hada [[id number]]    Delivery 01/28/2020 7:47 PM by  Vaginal, Spontaneous Sex:  female Gestational Age: [redacted]w[redacted]d Delivery Clinician:   Living?:         APGARS  One minute Five minutes Ten minutes  Skin color:        Heart rate:        Grimace:        Muscle tone:        Breathing:        Totals: 2  7  9     Presentation/position:      Resuscitation:   Cord information:    Disposition of cord blood:     Blood gases sent?  Complications:   Placenta: Delivered:       appearance Newborn Measurements: Weight: 7 lb 10 oz (3459 g)  Height: 20.47"  Head circumference:    Chest circumference:    Other providers:    Additional  information: Forceps:   Vacuum:   Breech:   Observed anomalies       Delivery Note At 7:47 PM a viable and healthy female was delivered via Vaginal, Spontaneous (Presentation: Vertex, Cephalic).  APGAR: 2, 7; weight 7 lb 10 oz (3459 g).   Placenta status: Spontaneous, Intact.  Cord: 3 vessels with the following complications: None.  Cord pH: obtained. Delayed cord clamping not performed due to iniital fetal status.   Anesthesia: Epidural Episiotomy: None Lacerations: 1st degree;Vaginal Suture Repair: 2.0 vicryl Est. Blood Loss (mL): 650  Mom to postpartum.  Baby to Couplet care / Skin to Skin.  , MD 01/28/2020, 8:26 PM

## 2019-12-29 ENCOUNTER — Encounter: Payer: Self-pay | Admitting: Obstetrics and Gynecology

## 2019-12-29 ENCOUNTER — Ambulatory Visit (INDEPENDENT_AMBULATORY_CARE_PROVIDER_SITE_OTHER): Payer: BLUE CROSS/BLUE SHIELD | Admitting: Obstetrics and Gynecology

## 2019-12-29 ENCOUNTER — Other Ambulatory Visit: Payer: Self-pay

## 2019-12-29 VITALS — BP 114/69 | HR 80 | Wt 296.6 lb

## 2019-12-29 DIAGNOSIS — O0993 Supervision of high risk pregnancy, unspecified, third trimester: Secondary | ICD-10-CM

## 2019-12-29 LAB — POCT URINALYSIS DIPSTICK OB
Bilirubin, UA: NEGATIVE
Blood, UA: NEGATIVE
Glucose, UA: NEGATIVE
Ketones, UA: NEGATIVE
Leukocytes, UA: NEGATIVE
Nitrite, UA: NEGATIVE
Spec Grav, UA: 1.01 (ref 1.010–1.025)
Urobilinogen, UA: 0.2 E.U./dL
pH, UA: 6.5 (ref 5.0–8.0)

## 2019-12-29 NOTE — Progress Notes (Signed)
ROB: Patient states her sugars are sometimes above 200.  Currently taking 1-1/2 glyburide twice daily.  She did not bring her sugar log or her meter today.  Patient due for hemoglobin A1c today.  Patient strongly advised to bring her meter and sugar log next week with her visit.  Consider subcu insulin as needed.  Anesthesia consult scheduled for increased BMI in 2 weeks.  Begin weekly NSTs in 2 weeks.

## 2019-12-30 LAB — HEMOGLOBIN A1C
Est. average glucose Bld gHb Est-mCnc: 194 mg/dL
Hgb A1c MFr Bld: 8.4 % — ABNORMAL HIGH (ref 4.8–5.6)

## 2020-01-05 ENCOUNTER — Other Ambulatory Visit: Payer: Self-pay

## 2020-01-05 ENCOUNTER — Ambulatory Visit (INDEPENDENT_AMBULATORY_CARE_PROVIDER_SITE_OTHER): Payer: BLUE CROSS/BLUE SHIELD | Admitting: Obstetrics and Gynecology

## 2020-01-05 ENCOUNTER — Encounter: Payer: Self-pay | Admitting: Obstetrics and Gynecology

## 2020-01-05 VITALS — BP 139/85 | HR 80 | Wt 292.4 lb

## 2020-01-05 DIAGNOSIS — O24415 Gestational diabetes mellitus in pregnancy, controlled by oral hypoglycemic drugs: Secondary | ICD-10-CM

## 2020-01-05 DIAGNOSIS — O0993 Supervision of high risk pregnancy, unspecified, third trimester: Secondary | ICD-10-CM

## 2020-01-05 DIAGNOSIS — Z6841 Body Mass Index (BMI) 40.0 and over, adult: Secondary | ICD-10-CM

## 2020-01-05 LAB — POCT URINALYSIS DIPSTICK OB
Bilirubin, UA: NEGATIVE
Blood, UA: NEGATIVE
Glucose, UA: NEGATIVE
Ketones, UA: NEGATIVE
Nitrite, UA: NEGATIVE
Spec Grav, UA: 1.01 (ref 1.010–1.025)
Urobilinogen, UA: 0.2 E.U./dL
pH, UA: 6.5 (ref 5.0–8.0)

## 2020-01-05 MED ORDER — GLYBURIDE 5 MG PO TABS: 10.0000 mg | ORAL_TABLET | Freq: Two times a day (BID) | ORAL | 1 refills | Status: DC

## 2020-01-05 NOTE — Progress Notes (Signed)
ROB: Patient notes some Dominique Sanders and pelvic pressure.  Reviewed blood sugar log, still several significant postprandial elevations (as high as 250s), and fastings (ranging 90s - 130s).  Notes complaince with Glyburide 7.5 mg BID. Will increase to 10 mg BID. Strongly encouraged better compliance with diet. Notes that she saw a nutritionist while at the [facility] office who is helping her to better control her diet. Advised that if blood sugars remain elevated, may need to consider addition of another medication (Metformin) vs initiation of insulin.  Also discussed likely need for induction of labor for uncontrolled DM at 37 or 38 weeks,  NST performed today was reviewed and was found to be reactive.  Continue recommended antenatal testing and prenatal care.  Will return in 1 week to review blood sugars. Has repeat Anesthesia consult next week. For growth scan in 2 weeks.    NONSTRESS TEST INTERPRETATION  INDICATIONS: GDM (on Glyburide)  FHR baseline: 140 bpm RESULTS:Reactive (with use of vibroacoustic stimulator) COMMENTS: No contractions   PLAN: 1. Continue fetal kick counts twice a day. 2. Continue antepartum testing as scheduled-Biweekly

## 2020-01-05 NOTE — Progress Notes (Signed)
ROB-Pt present for routine prenatal care. Pt having a lot of pain and pressure and braxton hicks contractions. Pt stated having swollen hands and feet off and on.

## 2020-01-05 NOTE — Patient Instructions (Signed)
Gestational Diabetes Mellitus, Self Care When you have gestational diabetes (gestational diabetes mellitus), you must make sure your blood sugar (glucose) stays in a healthy range. You can do this with:  Nutrition.  Exercise.  Lifestyle changes.  Medicines or insulin, if needed.  Support from your doctors and others. If you get treated for this condition, it may not hurt you or your unborn baby (fetus). If you do not get treated for this condition, it may cause problems that can hurt you or your unborn baby. If you get gestational diabetes, you are:  More likely to get it if you get pregnant again.  More likely to develop type 2 diabetes in the future. How to stay aware of blood sugar   Check your blood sugar every day while you are pregnant. Check it as often as told.  Call your doctor if your blood sugar is above your goal numbers for two tests in a row. Your doctor will set personal treatment goals for you. Generally, you should have these blood sugar levels:  Before meals, or after not eating for a long time (fasting or preprandial): at or below 95 mg/dL (5.3 mmol/L).  After meals (postprandial): ? One hour after a meal: at or below 140 mg/dL (7.8 mmol/L). ? Two hours after a meal: at or below 120 mg/dL (6.7 mmol/L).  A1c (hemoglobin A1c) level: 6-6.5%. How to manage high and low blood sugar Signs of high blood sugar High blood sugar is called hyperglycemia. Know the early signs of high blood sugar. Signs may include:  Feeling: ? Thirsty. ? Hungry. ? Very tired.  Needing to pee (urinate) more than usual.  Blurry vision. Signs of low blood sugar Low blood sugar is called hypoglycemia. This is when blood sugar is at or below 70 mg/dL (3.9 mmol/L). Signs may include:  Feeling: ? Hungry. ? Worried or nervous (anxious). ? Sweaty and clammy. ? Confused. ? Dizzy. ? Sleepy. ? Sick to your stomach (nauseous).  Having: ? A fast heartbeat. ? A headache. ? A change  in your vision. ? Tingling or no feeling (numbness) around your mouth, lips, or tongue. ? Jerky movements that you cannot control (seizure).  Having trouble with: ? Moving (coordination). ? Sleeping. ? Passing out (fainting). ? Getting upset easily (irritability). Treating low blood sugar To treat low blood sugar, eat or drink something sugary right away. If you can think clearly and swallow safely, follow the 15:15 rule:  Take 15 grams of a fast-acting carb (carbohydrate). Talk with your doctor about how much you should take.  Some fast-acting carbs are: ? Sugar tablets (glucose pills). Take 3-4 glucose pills. ? 6-8 pieces of hard candy. ? 4-6 oz (120-150 mL) of fruit juice. ? 4-6 oz (120-150 mL) of regular (not diet) soda. ? 1 Tbsp (15 mL) honey or sugar.  Check your blood sugar 15 minutes after you take the carb.  If your blood sugar is still at or below 70 mg/dL (3.9 mmol/L), take 15 grams of a carb again.  If your blood sugar does not go above 70 mg/dL (3.9 mmol/L) after 3 tries, get help right away.  After your blood sugar goes back to normal, eat a meal or a snack within 1 hour. Treating very low blood sugar If your blood sugar is at or below 54 mg/dL (3 mmol/L), you have very low blood sugar (severe hypoglycemia). This is an emergency. Do not wait to see if the symptoms will go away. Get medical help right  away. Call your local emergency services (911 in the [place] If you have very low blood sugar and you cannot eat or drink, you may need a glucagon shot (injection). A family member or friend should learn how to check your blood sugar and how to give you a glucagon shot. Ask your doctor if you need to have a glucagon shot kit at home. Follow these instructions at home: Medicine  Take your insulin and diabetes medicines as told.  If your doctor says you should take more or less insulin or medicines, do this exactly as told.  Do not run out of insulin or  medicines. Food   Make healthy food choices. These include: ? Chicken, fish, egg whites, and beans. ? Oats, whole wheat, bulgur, brown rice, quinoa, and millet. ? Fresh fruits and vegetables. ? Low-fat dairy products. ? Nuts, avocado, olive oil, and canola oil.  Meet with a food specialist (dietitian). He or she can help you make an eating plan that is right for you.  Follow instructions from your doctor about what you cannot eat or drink.  Drink enough fluid to keep your pee (urine) pale yellow.  Eat healthy snacks between healthy meals.  Keep track of carbs that you eat. Do this by reading food labels and learning food serving sizes.  Follow your sick day plan when you cannot eat or drink normally. Make this plan with your doctor so it is ready to use. Activity  Exercise for 30 or more minutes a day, or as much as your doctor recommends.  Talk with your doctor before you start a new exercise or activity. Your doctor may need to tell you to change: ? How much insulin or medicines you take. ? How much food you eat. Lifestyle  Do not drink alcohol.  Do not use any tobacco products. These include cigarettes, chewing tobacco, and e-cigarettes. If you need help quitting, ask your doctor.  Learn how to deal with stress. If you need help with this, ask your doctor. Body care  Stay up to date with your shots (immunizations).  Brush your teeth and gums two times a day. Floss one or more times a day.  Go to the dentist one or more times every 6 months.  Stay at a healthy weight while you are pregnant. General instructions  Take over-the-counter and prescription medicines only as told by your doctor.  Ask your doctor about risks of high blood pressure in pregnancy (preeclampsia and eclampsia).  Share your diabetes care plan with: ? Your work or school. ? People you live with.  Check your pee for ketones: ? When you are sick. ? As told by your doctor.  Carry a card or  wear jewelry that says you have diabetes.  Keep all follow-up visits as told by your doctor. This is important. Care after giving birth  Have your blood sugar checked 4-12 weeks after you give birth.  Get checked for diabetes one or more times during 3 years. Questions to ask your doctor  Do I need to meet with a diabetes educator?  Where can I find a support group for people with gestational diabetes? Where to find more information To learn more about diabetes, visit:  American Diabetes Association: www.diabetes.org  Centers for Disease Control and Prevention (CDC): www.cdc.gov Summary  Check your blood sugar (glucose) every day while you are pregnant. Check it as often as told.  Take your insulin and diabetes medicines as told.  Keep all follow-up visits as   told by your doctor. This is important.  Have your blood sugar checked 4-12 weeks after you give birth. This information is not intended to replace advice given to you by your health care provider. Make sure you discuss any questions you have with your health care provider. Document Revised: 06/02/2018 Document Reviewed: 01/13/2016 Elsevier Patient Education  2020 Elsevier Inc.  

## 2020-01-05 NOTE — Lactation Note (Signed)
Lactation Consultation Note  Patient Name: Dominique Sanders [id number] Date: 01/05/2020     Maternal Data    Feeding    LATCH Score                   Interventions    Lactation Tools Discussed/Used     Consult Status    Lactation Consultant talked with [name] about the benefits of breastfeeding as well as discussed breastfeeding information per the Ready, Set, Baby curriculum. [name] was hesitant about breastfeeding but LC answered questions and she is now considering it.  [name] 01/05/2020, 3:40 PM

## 2020-01-07 ENCOUNTER — Other Ambulatory Visit: Payer: Self-pay

## 2020-01-07 ENCOUNTER — Inpatient Hospital Stay (HOSPITAL_COMMUNITY)
Admission: EM | Admit: 2020-01-07 | Discharge: 2020-01-08 | Disposition: A | Discharge status: Home / Self Care | Payer: BLUE CROSS/BLUE SHIELD | Attending: Obstetrics and Gynecology | Admitting: Obstetrics and Gynecology

## 2020-01-07 ENCOUNTER — Encounter (HOSPITAL_COMMUNITY): Payer: Self-pay | Admitting: Obstetrics and Gynecology

## 2020-01-07 DIAGNOSIS — Z3A34 34 weeks gestation of pregnancy: Secondary | ICD-10-CM | POA: Diagnosis not present

## 2020-01-07 DIAGNOSIS — O1493 Unspecified pre-eclampsia, third trimester: Secondary | ICD-10-CM

## 2020-01-07 DIAGNOSIS — O4690 Antepartum hemorrhage, unspecified, unspecified trimester: Secondary | ICD-10-CM

## 2020-01-07 DIAGNOSIS — N760 Acute vaginitis: Secondary | ICD-10-CM

## 2020-01-07 DIAGNOSIS — Z6841 Body Mass Index (BMI) 40.0 and over, adult: Secondary | ICD-10-CM

## 2020-01-07 DIAGNOSIS — O1490 Unspecified pre-eclampsia, unspecified trimester: Secondary | ICD-10-CM | POA: Diagnosis present

## 2020-01-07 DIAGNOSIS — O24415 Gestational diabetes mellitus in pregnancy, controlled by oral hypoglycemic drugs: Secondary | ICD-10-CM | POA: Diagnosis not present

## 2020-01-07 DIAGNOSIS — N939 Abnormal uterine and vaginal bleeding, unspecified: Secondary | ICD-10-CM | POA: Diagnosis present

## 2020-01-07 DIAGNOSIS — B9689 Other specified bacterial agents as the cause of diseases classified elsewhere: Secondary | ICD-10-CM

## 2020-01-07 NOTE — MAU Provider Note (Addendum)
Chief Complaint:  Vaginal Bleeding   First Provider Initiated Contact with Patient 01/07/20 2350     HPI: Dominique Sanders is a 24 y.o. G1P0 at 35w2dwho presents to maternity admissions reporting one episode of seeing red blood when wiping.  No blood on clothing afterward,   No pain. Gets care in [place].  No history of PTL or bleeding.  . She reports good fetal movement, denies LOF, vaginal itching/burning, urinary symptoms, h/a, dizziness, n/v, diarrhea, constipation or fever/chills.  She denies headache, visual changes or RUQ abdominal pain.  Vaginal Bleeding The patient's primary symptoms include vaginal bleeding. The patient's pertinent negatives include no genital itching, genital lesions, genital odor or pelvic pain. This is a new problem. The current episode started today. The problem occurs rarely. The problem has been resolved. The patient is experiencing no pain. She is pregnant. Pertinent negatives include no abdominal pain, back pain, chills, fever, headaches, nausea or vomiting. The vaginal discharge was bloody. The vaginal bleeding is spotting. She has not been passing clots. She has not been passing tissue. Nothing aggravates the symptoms. She has tried nothing for the symptoms.  Hypertension This is a new problem. The current episode started today. The problem has been waxing and waning since onset. Associated symptoms include peripheral edema. Pertinent negatives include no anxiety, blurred vision, chest pain, headaches, palpitations or shortness of breath. There are no associated agents to hypertension. There are no known risk factors for coronary artery disease. Past treatments include nothing. There are no compliance problems.    RN note: Went to [employer] and saw some blood on tissue. No pain. Had some stress before this happened. Was doing a Door Dash run and someone was following their car causing a stressful situation. Noticed bleeding after this. No recent intercourse  Past  Medical History: History reviewed. No pertinent past medical history.  Past obstetric history: OB History  Gravida Para Term Preterm AB Living  1            SAB TAB Ectopic Multiple Live Births               # Outcome Date GA Lbr Len/2nd Weight Sex Delivery Anes PTL Lv  1 Current             Past Surgical History: Past Surgical History:  Procedure Laterality Date  . no surgical       Family History: Family History  Problem Relation Age of Onset  . Healthy Mother   . Healthy Father     Social History: Social History   Tobacco Use  . Smoking status: Never Smoker  . Smokeless tobacco: Never Used  Substance Use Topics  . Alcohol use: Not Currently  . Drug use: No    Allergies: No Known Allergies  Meds:  Medications Prior to Admission  Medication Sig Dispense Refill Last Dose  . glucose blood (CONTOUR NEXT TEST) test strip Use as instructed 100 each 12 01/07/2020 at Unknown time  . glyBURIDE (DIABETA) 5 MG tablet Take 2 tablets (10 mg total) by mouth 2 (two) times daily with a meal. 120 tablet 1 01/07/2020 at Unknown time  . Microlet Lancets MISC 1 each by Does not apply route 4 (four) times daily. 100 each 6 01/07/2020 at Unknown time  . Prenatal Vit-Fe Fumarate-FA (PRENATAL MULTIVITAMIN) TABS tablet Take 1 tablet by mouth daily at 12 noon.   01/07/2020 at Unknown time    I have reviewed patient's Past Medical Hx, Surgical Hx, Family Hx, Social  Hx, medications and allergies.   ROS:  Review of Systems  Constitutional: Negative for chills and fever.  Eyes: Negative for blurred vision.  Respiratory: Negative for shortness of breath.   Cardiovascular: Negative for chest pain and palpitations.  Gastrointestinal: Negative for abdominal pain, nausea and vomiting.  Genitourinary: Positive for vaginal bleeding. Negative for pelvic pain.  Musculoskeletal: Negative for back pain.  Neurological: Negative for headaches.   Other systems negative  Physical Exam   Patient  Vitals for the past 24 hrs:  BP Temp Temp src Pulse Resp SpO2 Height Weight  01/07/20 2311 128/79 -- -- 99 -- -- -- --  01/07/20 2309 -- 98.2 F (36.8 C) -- -- -- -- 5\' 2"  (1.575 m) 132 kg  01/07/20 2246 (!) 148/85 98.3 F (36.8 C) Oral 84 (!) 21 100 % -- --   Vitals:   01/08/20 0202 01/08/20 0217 01/08/20 0232 01/08/20 0247  BP: (!) 141/45 140/74 (!) 152/88 (!) 125/48  Pulse: 81 82 89 84  Resp:      Temp:      TempSrc:      SpO2:      Weight:      Height:        Constitutional: Well-developed, well-nourished female in no acute distress.  Cardiovascular: normal rate and rhythm Respiratory: normal effort, clear to auscultation bilaterally GI: Abd soft, non-tender, gravid appropriate for gestational age.   No rebound or guarding. MS: Extremities nontender, no edema, normal ROM Neurologic: Alert and oriented x 4.  GU: Neg CVAT.  PELVIC EXAM: Cervix pink, visually closed, without lesion, scant white creamy discharge, vaginal walls and external genitalia normal   NO BLOOD VISIBLE ANYWHERE in vagina or perineum.    Dilation: Closed Exam by:: [email or web address], CNM  FHT:  Baseline 140 , moderate variability, accelerations present, no decelerations Contractions:  Rare   Labs: Results for orders placed or performed during the hospital encounter of 01/07/20 (from the past 24 hour(s))  Urinalysis, Routine w reflex microscopic     Status: Abnormal   Collection Time: 01/07/20 11:20 PM  Result Value Ref Range   Color, Urine STRAW (A) YELLOW   APPearance CLEAR CLEAR   Specific Gravity, Urine 1.009 1.005 - 1.030   pH 7.0 5.0 - 8.0   Glucose, UA >=500 (A) NEGATIVE mg/dL   Hgb urine dipstick SMALL (A) NEGATIVE   Bilirubin Urine NEGATIVE NEGATIVE   Ketones, ur NEGATIVE NEGATIVE mg/dL   Protein, ur 01/09/20 (A) NEGATIVE mg/dL   Nitrite NEGATIVE NEGATIVE   Leukocytes,Ua SMALL (A) NEGATIVE   RBC / HPF 0-5 0 - 5 RBC/hpf   WBC, UA 0-5 0 - 5 WBC/hpf   Bacteria, UA NONE SEEN NONE SEEN   Squamous  Epithelial / LPF 0-5 0 - 5  Protein / creatinine ratio, urine     Status: Abnormal   Collection Time: 01/07/20 11:20 PM  Result Value Ref Range   Creatinine, Urine 36.75 mg/dL   Total Protein, Urine 72 mg/dL   Protein Creatinine Ratio 1.96 (H) 0.00 - 0.15 mg/mg[Cre]  Wet prep, genital     Status: Abnormal   Collection Time: 01/07/20 11:51 PM   Specimen: Vaginal  Result Value Ref Range   Yeast Wet Prep HPF POC PRESENT (A) NONE SEEN   Trich, Wet Prep NONE SEEN NONE SEEN   Clue Cells Wet Prep HPF POC PRESENT (A) NONE SEEN   WBC, Wet Prep HPF POC MODERATE (A) NONE SEEN   Sperm NONE SEEN  CBC     Status: Abnormal   Collection Time: 01/08/20  1:24 AM  Result Value Ref Range   WBC 11.7 (H) 4.0 - 10.5 K/uL   RBC 4.54 3.87 - 5.11 MIL/uL   Hemoglobin 12.7 12.0 - 15.0 g/dL   HCT 29.5 28.4 - 13.2 %   MCV 84.8 80.0 - 100.0 fL   MCH 28.0 26.0 - 34.0 pg   MCHC 33.0 30.0 - 36.0 g/dL   RDW 44.0 10.2 - 72.5 %   Platelets 219 150 - 400 K/uL   nRBC 0.0 0.0 - 0.2 %  Comprehensive metabolic panel     Status: Abnormal   Collection Time: 01/08/20  1:24 AM  Result Value Ref Range   Sodium 133 (L) 135 - 145 mmol/L   Potassium 4.0 3.5 - 5.1 mmol/L   Chloride 103 98 - 111 mmol/L   CO2 20 (L) 22 - 32 mmol/L   Glucose, Bld 256 (H) 70 - 99 mg/dL   BUN 12 6 - 20 mg/dL   Creatinine, Ser 3.66 0.44 - 1.00 mg/dL   Calcium 9.6 8.9 - 44.0 mg/dL   Total Protein 6.7 6.5 - 8.1 g/dL   Albumin 2.7 (L) 3.5 - 5.0 g/dL   AST 17 15 - 41 U/L   ALT 16 0 - 44 U/L   Alkaline Phosphatase 99 38 - 126 U/L   Total Bilirubin 0.3 0.3 - 1.2 mg/dL   GFR calc non Af Amer >60 >60 mL/min   GFR calc Af Amer >60 >60 mL/min   Anion gap 10 5 - 15    O/Positive/-- (08/18 0947)  Imaging:  No results found.  MAU Course/MDM: I have ordered labs and reviewed results. Protein is elevated in urine NST reviewed, reactive Has had intermittent hypertension while here.  No history of that this pregnancy Preeclampsia labs done and  shows high Pr/Cr Ration Discussed preeclampsia Recommend she call her MD today and inform Will need IOL at 37 wks unless begins to have severe features      Assessment: 1. BMI 50.0-59.9, adult (HCC)   Single IUP at [redacted]w[redacted]d One episode of bleeidng, no evidence of bleeding Reactive NST New Preeclampsia Hyperglycemia in setting of GDM  Plan: Discharge home Preeclampsia precautions Call MD in am Labor precautions and fetal kick counts Follow up in Office for prenatal visits and recheck BP  Encouraged to return here or to other Urgent Care/ED if she develops worsening of symptoms, increase in pain, fever, or other concerning symptoms.   Pt stable at time of discharge.  Wynelle Bourgeois CNM, MSN Certified Nurse-Midwife 01/07/2020 11:51 PM

## 2020-01-07 NOTE — MAU Note (Signed)
Went to [employer] and saw some blood on tissue. No pain. Had some stress before this happened. Was doing a Door Dash run and someone was following their car causing a stressful situation. Noticed bleeding after this. No recent intercourse

## 2020-01-08 DIAGNOSIS — O1490 Unspecified pre-eclampsia, unspecified trimester: Secondary | ICD-10-CM | POA: Diagnosis present

## 2020-01-08 DIAGNOSIS — O1493 Unspecified pre-eclampsia, third trimester: Secondary | ICD-10-CM

## 2020-01-08 DIAGNOSIS — Z3A34 34 weeks gestation of pregnancy: Secondary | ICD-10-CM

## 2020-01-08 HISTORY — DX: Unspecified pre-eclampsia, unspecified trimester: O14.90

## 2020-01-08 LAB — COMPREHENSIVE METABOLIC PANEL
ALT: 16 U/L (ref 0–44)
AST: 17 U/L (ref 15–41)
Albumin: 2.7 g/dL — ABNORMAL LOW (ref 3.5–5.0)
Alkaline Phosphatase: 99 U/L (ref 38–126)
Anion gap: 10 (ref 5–15)
BUN: 12 mg/dL (ref 6–20)
CO2: 20 mmol/L — ABNORMAL LOW (ref 22–32)
Calcium: 9.6 mg/dL (ref 8.9–10.3)
Chloride: 103 mmol/L (ref 98–111)
Creatinine, Ser: 0.96 mg/dL (ref 0.44–1.00)
GFR calc Af Amer: >60 mL/min (ref >60)
GFR calc non Af Amer: >60 mL/min (ref >60)
Glucose, Bld: 256 mg/dL — ABNORMAL HIGH (ref 70–99)
Potassium: 4 mmol/L (ref 3.5–5.1)
Sodium: 133 mmol/L — ABNORMAL LOW (ref 135–145)
Total Bilirubin: 0.3 mg/dL (ref 0.3–1.2)
Total Protein: 6.7 g/dL (ref 6.5–8.1)

## 2020-01-08 LAB — URINALYSIS, ROUTINE W REFLEX MICROSCOPIC
Bacteria, UA: NONE SEEN
Bilirubin Urine: NEGATIVE
Glucose, UA: >=500 mg/dL — AB
Ketones, ur: NEGATIVE mg/dL
Nitrite: NEGATIVE
Protein, ur: 100 mg/dL — AB
Specific Gravity, Urine: 1.009 (ref 1.005–1.030)
pH: 7 (ref 5.0–8.0)

## 2020-01-08 LAB — GC/CHLAMYDIA PROBE AMP (~~LOC~~) NOT AT ARMC
Chlamydia: NEGATIVE
Comment: NEGATIVE
Comment: NORMAL
Neisseria Gonorrhea: NEGATIVE

## 2020-01-08 LAB — WET PREP, GENITAL
Sperm: NONE SEEN
Trich, Wet Prep: NONE SEEN

## 2020-01-08 LAB — CBC
HCT: 38.5 % (ref 36.0–46.0)
Hemoglobin: 12.7 g/dL (ref 12.0–15.0)
MCH: 28 pg (ref 26.0–34.0)
MCHC: 33 g/dL (ref 30.0–36.0)
MCV: 84.8 fL (ref 80.0–100.0)
Platelets: 219 10*3/uL (ref 150–400)
RBC: 4.54 MIL/uL (ref 3.87–5.11)
RDW: 14 % (ref 11.5–15.5)
WBC: 11.7 10*3/uL — ABNORMAL HIGH (ref 4.0–10.5)
nRBC: 0 % (ref 0.0–0.2)

## 2020-01-08 LAB — PROTEIN / CREATININE RATIO, URINE
Creatinine, Urine: 36.75 mg/dL
Protein Creatinine Ratio: 1.96 mg/mg{Cre} — ABNORMAL HIGH (ref 0.00–0.15)
Total Protein, Urine: 72 mg/dL

## 2020-01-08 MED ORDER — METRONIDAZOLE 500 MG PO TABS: 500.0000 mg | ORAL_TABLET | Freq: Two times a day (BID) | ORAL | 0 refills | Status: AC

## 2020-01-08 NOTE — Discharge Instructions (Signed)
Preeclampsia and Eclampsia Preeclampsia is a serious condition that may develop during pregnancy. This condition causes high blood pressure and increased protein in your urine along with other symptoms, such as headaches and vision changes. These symptoms may develop as the condition gets worse. Preeclampsia may occur at 20 weeks of pregnancy or later. Diagnosing and treating preeclampsia early is very important. If not treated early, it can cause serious problems for you and your baby. One problem it can lead to is eclampsia. Eclampsia is a condition that causes muscle jerking or shaking (convulsions or seizures) and other serious problems for the mother. During pregnancy, delivering your baby may be the best treatment for preeclampsia or eclampsia. For most women, preeclampsia and eclampsia symptoms go away after giving birth. In rare cases, a woman may develop preeclampsia after giving birth (postpartum preeclampsia). This usually occurs within 48 hours after childbirth but may occur up to 6 weeks after giving birth. What are the causes? The cause of preeclampsia is not known. What increases the risk? The following risk factors make you more likely to develop preeclampsia:  Being pregnant for the first time.  Having had preeclampsia during a past pregnancy.  Having a family history of preeclampsia.  Having high blood pressure.  Being pregnant with more than one baby.  Being 35 or older.  Being African-American.  Having kidney disease or diabetes.  Having medical conditions such as lupus or blood diseases.  Being very overweight (obese). What are the signs or symptoms? The most common symptoms are:  Severe headaches.  Vision problems, such as blurred or double vision.  Abdominal pain, especially upper abdominal pain. Other symptoms that may develop as the condition gets worse include:  Sudden weight gain.  Sudden swelling of the hands, face, legs, and feet.  Severe nausea  and vomiting.  Numbness in the face, arms, legs, and feet.  Dizziness.  Urinating less than usual.  Slurred speech.  Convulsions or seizures. How is this diagnosed? There are no screening tests for preeclampsia. Your health care provider will ask you about symptoms and check for signs of preeclampsia during your prenatal visits. You may also have tests that include:  Checking your blood pressure.  Urine tests to check for protein. Your health care provider will check for this at every prenatal visit.  Blood tests.  Monitoring your baby's heart rate.  Ultrasound. How is this treated? You and your health care provider will determine the treatment approach that is best for you. Treatment may include:  Having more frequent prenatal exams to check for signs of preeclampsia, if you have an increased risk for preeclampsia.  Medicine to lower your blood pressure.  Staying in the hospital, if your condition is severe. There, treatment will focus on controlling your blood pressure and the amount of fluids in your body (fluid retention).  Taking medicine (magnesium sulfate) to prevent seizures. This may be given as an injection or through an IV.  Taking a low-dose aspirin during your pregnancy.  Delivering your baby early. You may have your labor started with medicine (induced), or you may have a cesarean delivery. Follow these instructions at home: Eating and drinking   Drink enough fluid to keep your urine pale yellow.  Avoid caffeine. Lifestyle  Do not use any products that contain nicotine or tobacco, such as cigarettes and e-cigarettes. If you need help quitting, ask your health care provider.  Do not use alcohol or drugs.  Avoid stress as much as possible. Rest and get   plenty of sleep. General instructions  Take over-the-counter and prescription medicines only as told by your health care provider.  When lying down, lie on your left side. This keeps pressure off your  major blood vessels.  When sitting or lying down, raise (elevate) your feet. Try putting some pillows underneath your lower legs.  Exercise regularly. Ask your health care provider what kinds of exercise are best for you.  Keep all follow-up and prenatal visits as told by your health care provider. This is important. How is this prevented? There is no known way of preventing preeclampsia or eclampsia from developing. However, to lower your risk of complications and detect problems early:  Get regular prenatal care. Your health care provider may be able to diagnose and treat the condition early.  Maintain a healthy weight. Ask your health care provider for help managing weight gain during pregnancy.  Work with your health care provider to manage any long-term (chronic) health conditions you have, such as diabetes or kidney problems.  You may have tests of your blood pressure and kidney function after giving birth.  Your health care provider may have you take low-dose aspirin during your next pregnancy. Contact a health care provider if:  You have symptoms that your health care provider told you may require more treatment or monitoring, such as: ? Headaches. ? Nausea or vomiting. ? Abdominal pain. ? Dizziness. ? Light-headedness. Get help right away if:  You have severe: ? Abdominal pain. ? Headaches that do not get better. ? Dizziness. ? Vision problems. ? Confusion. ? Nausea or vomiting.  You have any of the following: ? A seizure. ? Sudden, rapid weight gain. ? Sudden swelling in your hands, ankles, or face. ? Trouble moving any part of your body. ? Numbness in any part of your body. ? Trouble speaking. ? Abnormal bleeding.  You faint. Summary  Preeclampsia is a serious condition that may develop during pregnancy.  This condition causes high blood pressure and increased protein in your urine along with other symptoms, such as headaches and vision  changes.  Diagnosing and treating preeclampsia early is very important. If not treated early, it can cause serious problems for you and your baby.  Get help right away if you have symptoms that your health care provider told you to watch for. This information is not intended to replace advice given to you by your health care provider. Make sure you discuss any questions you have with your health care provider. Document Revised: 08/12/2018 Document Reviewed: 07/16/2016 Elsevier Patient Education  2020 [employer] Bacterial Vaginosis  Bacterial vaginosis is an infection of the vagina. It happens when too many normal germs (healthy bacteria) grow in the vagina. This infection puts you at risk for infections from sex (STIs). Treating this infection can lower your risk for some STIs. You should also treat this if you are pregnant. It can cause your baby to be born early. Follow these instructions at home: Medicines  Take over-the-counter and prescription medicines only as told by your doctor.  Take or use your antibiotic medicine as told by your doctor. Do not stop taking or using it even if you start to feel better. General instructions  If you your sexual partner is a woman, tell her that you have this infection. She needs to get treatment if she has symptoms. If you have a female partner, he does not need to be treated.  During treatment: ? Avoid sex. ? Do not douche. ? Avoid alcohol  as told. ? Avoid breastfeeding as told.  Drink enough fluid to keep your pee (urine) clear or pale yellow.  Keep your vagina and butt (rectum) clean. ? Wash the area with warm water every day. ? Wipe from front to back after you use the toilet.  Keep all follow-up visits as told by your doctor. This is important. Preventing this condition  Do not douche.  Use only warm water to wash around your vagina.  Use protection when you have sex. This includes: ? Latex condoms. ? Dental dams.  Limit how  many people you have sex with. It is best to only have sex with the same person (be monogamous).  Get tested for STIs. Have your partner get tested.  Wear underwear that is cotton or lined with cotton.  Avoid tight pants and pantyhose. This is most important in summer.  Do not use any products that have nicotine or tobacco in them. These include cigarettes and e-cigarettes. If you need help quitting, ask your doctor.  Do not use illegal drugs.  Limit how much alcohol you drink. Contact a doctor if:  Your symptoms do not get better, even after you are treated.  You have more discharge or pain when you pee (urinate).  You have a fever.  You have pain in your belly (abdomen).  You have pain with sex.  Your bleed from your vagina between periods. Summary  This infection happens when too many germs (bacteria) grow in the vagina.  Treating this condition can lower your risk for some infections from sex (STIs).  You should also treat this if you are pregnant. It can cause early (premature) birth.  Do not stop taking or using your antibiotic medicine even if you start to feel better. This information is not intended to replace advice given to you by your health care provider. Make sure you discuss any questions you have with your health care provider. Document Revised: 11/22/2017 Document Reviewed: 08/25/2016 Elsevier Patient Education  2020 [employer].

## 2020-01-09 LAB — CULTURE, OB URINE

## 2020-01-11 ENCOUNTER — Other Ambulatory Visit: Payer: Self-pay

## 2020-01-11 ENCOUNTER — Encounter
Admission: RE | Admit: 2020-01-11 | Discharge: 2020-01-11 | Disposition: A | Discharge status: Home / Self Care | Payer: Medicaid Other | Source: Ambulatory Visit | Attending: Anesthesiology | Admitting: Anesthesiology

## 2020-01-11 NOTE — Consult Note (Signed)
Dominique Sanders Anesthesia Consultation  Sinaya Minogue PPJ:[id number] DOB: [birth year] DOA: 01/11/2020 PCP: Patient, No Pcp Per   Requesting physician: Dr. Valentino Saxon Date of consultation: 01/11/20 Reason for consultation: Obesity during pregnancy  CHIEF COMPLAINT:  Obesity during pregnancy  HISTORY OF PRESENT ILLNESS: Dominique Sanders  is a 24 y.o. female with a known history of obesity in pregnancy. She was recently diagnosed with preeclampsia and is being closely followed for that with plan for delivery no later than 37 weeks.   PAST MEDICAL HISTORY:  No past medical history on file.  PAST SURGICAL HISTORY:  Past Surgical History:  Procedure Laterality Date  . no surgical       SOCIAL HISTORY:  Social History   Tobacco Use  . Smoking status: Never Smoker  . Smokeless tobacco: Never Used  Substance Use Topics  . Alcohol use: Not Currently    FAMILY HISTORY:  Family History  Problem Relation Age of Onset  . Healthy Mother   . Healthy Father     DRUG ALLERGIES: No Known Allergies  REVIEW OF SYSTEMS:   RESPIRATORY: No cough, shortness of breath, wheezing.  CARDIOVASCULAR: No chest pain, orthopnea, edema.  HEMATOLOGY: No anemia, easy bruising or bleeding SKIN: No rash or lesion. NEUROLOGIC: No tingling, numbness, weakness.  PSYCHIATRY: No anxiety or depression.   MEDICATIONS AT HOME:  Prior to Admission medications   Medication Sig Start Date End Date Taking? Authorizing Provider  glucose blood (CONTOUR NEXT TEST) test strip Use as instructed 09/30/19   Shambley, Melody N, CNM  glyBURIDE (DIABETA) 5 MG tablet Take 2 tablets (10 mg total) by mouth 2 (two) times daily with a meal. 01/05/20   Hildred Laser, MD  metroNIDAZOLE (FLAGYL) 500 MG tablet Take 1 tablet (500 mg total) by mouth 2 (two) times daily for 7 days. 01/08/20 01/15/20  Aviva Signs, CNM  Microlet Lancets MISC 1 each by Does not apply route 4 (four) times daily. 09/30/19   Shambley,  Melody N, CNM  Prenatal Vit-Fe Fumarate-FA (PRENATAL MULTIVITAMIN) TABS tablet Take 1 tablet by mouth daily at 12 noon.    [provider]      PHYSICAL EXAMINATION:   VITAL SIGNS: Last menstrual period 11/06/2018.  GENERAL:  24 y.o.-year-old patient no acute distress.  HEENT: Head atraumatic, normocephalic. Oropharynx and nasopharynx clear. MP 2, TM distance >3 cm, normal mouth opening, grade 1 upper lip bite. LUNGS: Normal breath sounds bilaterally, no wheezing, rales,rhonchi. No use of accessory muscles of respiration.  CARDIOVASCULAR: S1, S2 normal. No murmurs, rubs, or gallops.  EXTREMITIES: No pedal edema, cyanosis, or clubbing.  NEUROLOGIC: normal gait PSYCHIATRIC: The patient is alert and oriented x 3.  SKIN: No obvious rash, lesion, or ulcer.    IMPRESSION AND PLAN:   Dominique Sanders  is a 23 y.o. female presenting with obesity during pregnancy. BMI is currently 53 at [redacted] weeks gestation.   Airway exam reassuring today. Significant back adiposity, spinal interspaces minimally palpable.   We discussed analgesic options during labor including epidural analgesia. Discussed that in obesity there can be increased difficulty with epidural placement or even failure of successful epidural. We also discussed that even after successful epidural placement there is increased risk of catheter migration out of the epidural space that would require catheter replacement. Discussed use of epidural vs spinal vs GA if cesarean delivery is required. Discussed increased risk of difficult intubation during pregnancy should an emergency cesarean delivery be required.   Plan for [facility] at [facility].

## 2020-01-12 ENCOUNTER — Encounter: Payer: Self-pay | Admitting: Obstetrics and Gynecology

## 2020-01-12 ENCOUNTER — Ambulatory Visit (INDEPENDENT_AMBULATORY_CARE_PROVIDER_SITE_OTHER): Payer: BLUE CROSS/BLUE SHIELD | Admitting: Obstetrics and Gynecology

## 2020-01-12 ENCOUNTER — Other Ambulatory Visit: Payer: BLUE CROSS/BLUE SHIELD

## 2020-01-12 VITALS — BP 131/83 | HR 92 | Wt 290.0 lb

## 2020-01-12 DIAGNOSIS — O0993 Supervision of high risk pregnancy, unspecified, third trimester: Secondary | ICD-10-CM

## 2020-01-12 DIAGNOSIS — Z3A35 35 weeks gestation of pregnancy: Secondary | ICD-10-CM

## 2020-01-12 DIAGNOSIS — O24419 Gestational diabetes mellitus in pregnancy, unspecified control: Secondary | ICD-10-CM

## 2020-01-12 DIAGNOSIS — O99213 Obesity complicating pregnancy, third trimester: Secondary | ICD-10-CM | POA: Diagnosis not present

## 2020-01-12 LAB — POCT URINALYSIS DIPSTICK OB
Bilirubin, UA: NEGATIVE
Glucose, UA: NEGATIVE
Ketones, UA: NEGATIVE
Leukocytes, UA: NEGATIVE
Nitrite, UA: NEGATIVE
Spec Grav, UA: 1.01 (ref 1.010–1.025)
Urobilinogen, UA: 0.2 E.U./dL
pH, UA: 6.5 (ref 5.0–8.0)

## 2020-01-12 MED ORDER — METFORMIN HCL 500 MG PO TABS: 750.0000 mg | ORAL_TABLET | Freq: Two times a day (BID) | ORAL | 0 refills | Status: DC

## 2020-01-12 NOTE — Progress Notes (Signed)
ROB: Patient has no active complaints.  Denies contractions.  Denies preeclamptic signs and symptoms.  Signs and symptoms of labor discussed.  NST today nonreactive.  BPP scheduled for tomorrow.  Patient needs follow-up urinary protein creatinine ratio tomorrow.  Review of sugars reveal lack of glycemic control despite 10 mg glyburide twice daily.  Patient does not want to start insulin.  Will stop glyburide and begin Metformin 750 twice daily.  We discussed the likelihood of early delivery at 44 or 38 weeks because of uncontrolled diabetes and gestational hypertension/preeclampsia.

## 2020-01-12 NOTE — Lactation Note (Signed)
Lactation Consultation Note  Patient Name: Vincenzina Jagoda [id number] Date: 01/12/2020     Maternal Data    Feeding    LATCH Score                   Interventions    Lactation Tools Discussed/Used     Consult Status  Lactation consultant followed up with Nene about breastfeeding information discussed at a prior visit. Sumer had questions regarding infant feeding cues. Questions were answered and she denies further breastfeeding questions or concerns at this time.    [name] 01/12/2020, 4:27 PM

## 2020-01-13 ENCOUNTER — Other Ambulatory Visit: Payer: BLUE CROSS/BLUE SHIELD

## 2020-01-14 ENCOUNTER — Other Ambulatory Visit: Payer: Self-pay

## 2020-01-14 ENCOUNTER — Telehealth: Payer: Self-pay | Admitting: Obstetrics and Gynecology

## 2020-01-14 ENCOUNTER — Ambulatory Visit (INDEPENDENT_AMBULATORY_CARE_PROVIDER_SITE_OTHER): Payer: BLUE CROSS/BLUE SHIELD

## 2020-01-14 ENCOUNTER — Other Ambulatory Visit: Payer: BLUE CROSS/BLUE SHIELD

## 2020-01-14 DIAGNOSIS — O99213 Obesity complicating pregnancy, third trimester: Secondary | ICD-10-CM | POA: Diagnosis not present

## 2020-01-14 DIAGNOSIS — O0993 Supervision of high risk pregnancy, unspecified, third trimester: Secondary | ICD-10-CM

## 2020-01-14 DIAGNOSIS — Z3A35 35 weeks gestation of pregnancy: Secondary | ICD-10-CM | POA: Diagnosis not present

## 2020-01-14 DIAGNOSIS — O24419 Gestational diabetes mellitus in pregnancy, unspecified control: Secondary | ICD-10-CM

## 2020-01-14 DIAGNOSIS — O1493 Unspecified pre-eclampsia, third trimester: Secondary | ICD-10-CM

## 2020-01-14 NOTE — Telephone Encounter (Signed)
On 01/14/2020 went in patient chart for current lab order I notice that Dominique Sanders had lab order for antibody screen that was not drawn in August reported this [facility] doctor evans nurse I told Dr.Cherry also because her next visit will be with her the patient was told also she left the office anyway. I was on vaccation when this patient was suppose to be drawn in august this order by [facility]. Lab still need to be collected [place] [facility] phlebotomist 1237 pm

## 2020-01-15 LAB — PROTEIN / CREATININE RATIO, URINE
Creatinine, Urine: 78.4 mg/dL
Protein, Ur: 49.1 mg/dL
Protein/Creat Ratio: 626 mg/g creat — ABNORMAL HIGH (ref 0–200)

## 2020-01-19 ENCOUNTER — Encounter: Payer: Self-pay | Admitting: Obstetrics and Gynecology

## 2020-01-19 ENCOUNTER — Other Ambulatory Visit: Payer: Self-pay

## 2020-01-19 ENCOUNTER — Ambulatory Visit (INDEPENDENT_AMBULATORY_CARE_PROVIDER_SITE_OTHER): Payer: BLUE CROSS/BLUE SHIELD | Admitting: Obstetrics and Gynecology

## 2020-01-19 VITALS — BP 136/83 | HR 86 | Wt 286.0 lb

## 2020-01-19 DIAGNOSIS — O24415 Gestational diabetes mellitus in pregnancy, controlled by oral hypoglycemic drugs: Secondary | ICD-10-CM

## 2020-01-19 DIAGNOSIS — O99210 Obesity complicating pregnancy, unspecified trimester: Secondary | ICD-10-CM

## 2020-01-19 DIAGNOSIS — O1493 Unspecified pre-eclampsia, third trimester: Secondary | ICD-10-CM

## 2020-01-19 DIAGNOSIS — O0993 Supervision of high risk pregnancy, unspecified, third trimester: Secondary | ICD-10-CM

## 2020-01-19 LAB — POCT URINALYSIS DIPSTICK OB
Bilirubin, UA: NEGATIVE
Blood, UA: NEGATIVE
Glucose, UA: NEGATIVE
Ketones, UA: NEGATIVE
Nitrite, UA: NEGATIVE
Spec Grav, UA: 1.01 (ref 1.010–1.025)
Urobilinogen, UA: 0.2 E.U./dL
pH, UA: 7 (ref 5.0–8.0)

## 2020-01-19 NOTE — Progress Notes (Signed)
ROB: Patient complains of back and abdominal pain due to fetal movement. Has difficulty sleeping at night, has not tried anything. Recommend Benadryl or Unisom. Can use heating pads at night. Notes she could not get her Metformin due to the pharmacy being out of stock.  Went on a short road trip last weekend but forgot her diabetes supplies so was unable to check her sugars for a few days. Checked blood sugar in room, was 123 (fasting). Review of remainder of logs notes fastings of 116-130s, postprandials 120s-200s. Still taking Glyburide 10 mg BID. Ultrasound performed today for growth/BPP performed today, 86%ile, BPP 6/8 (2 off for movement, but breathing is present, fetal activity noted in exam room so not concerning).  For repeat PIH labs today. Discussed pre-eclampsia warning signs. RTC in 3 days for NST.  Will consider IOL between 37 and [redacted] weeks gestation due to uncontrolled DM and mild pre-eclampsia.

## 2020-01-19 NOTE — Progress Notes (Signed)
ROB-Pt present for routine prenatal care and 36 weeks cultures. Pt stated having back and abd pain. Pt stated that she has not been taking her metformin 500 mg due to the pharmacy stated that they did not have it in stock.

## 2020-01-19 NOTE — Patient Instructions (Signed)
Preeclampsia and Eclampsia Preeclampsia is a serious condition that may develop during pregnancy. This condition causes high blood pressure and increased protein in your urine along with other symptoms, such as headaches and vision changes. These symptoms may develop as the condition gets worse. Preeclampsia may occur at 20 weeks of pregnancy or later. Diagnosing and treating preeclampsia early is very important. If not treated early, it can cause serious problems for you and your baby. One problem it can lead to is eclampsia. Eclampsia is a condition that causes muscle jerking or shaking (convulsions or seizures) and other serious problems for the mother. During pregnancy, delivering your baby may be the best treatment for preeclampsia or eclampsia. For most women, preeclampsia and eclampsia symptoms go away after giving birth. In rare cases, a woman may develop preeclampsia after giving birth (postpartum preeclampsia). This usually occurs within 48 hours after childbirth but may occur up to 6 weeks after giving birth. What are the causes? The cause of preeclampsia is not known. What increases the risk? The following risk factors make you more likely to develop preeclampsia:  Being pregnant for the first time.  Having had preeclampsia during a past pregnancy.  Having a family history of preeclampsia.  Having high blood pressure.  Being pregnant with more than one baby.  Being 35 or older.  Being African-American.  Having kidney disease or diabetes.  Having medical conditions such as lupus or blood diseases.  Being very overweight (obese). What are the signs or symptoms? The most common symptoms are:  Severe headaches.  Vision problems, such as blurred or double vision.  Abdominal pain, especially upper abdominal pain. Other symptoms that may develop as the condition gets worse include:  Sudden weight gain.  Sudden swelling of the hands, face, legs, and feet.  Severe nausea  and vomiting.  Numbness in the face, arms, legs, and feet.  Dizziness.  Urinating less than usual.  Slurred speech.  Convulsions or seizures. How is this diagnosed? There are no screening tests for preeclampsia. Your health care provider will ask you about symptoms and check for signs of preeclampsia during your prenatal visits. You may also have tests that include:  Checking your blood pressure.  Urine tests to check for protein. Your health care provider will check for this at every prenatal visit.  Blood tests.  Monitoring your baby's heart rate.  Ultrasound. How is this treated? You and your health care provider will determine the treatment approach that is best for you. Treatment may include:  Having more frequent prenatal exams to check for signs of preeclampsia, if you have an increased risk for preeclampsia.  Medicine to lower your blood pressure.  Staying in the hospital, if your condition is severe. There, treatment will focus on controlling your blood pressure and the amount of fluids in your body (fluid retention).  Taking medicine (magnesium sulfate) to prevent seizures. This may be given as an injection or through an IV.  Taking a low-dose aspirin during your pregnancy.  Delivering your baby early. You may have your labor started with medicine (induced), or you may have a cesarean delivery. Follow these instructions at home: Eating and drinking   Drink enough fluid to keep your urine pale yellow.  Avoid caffeine. Lifestyle  Do not use any products that contain nicotine or tobacco, such as cigarettes and e-cigarettes. If you need help quitting, ask your health care provider.  Do not use alcohol or drugs.  Avoid stress as much as possible. Rest and get   plenty of sleep. General instructions  Take over-the-counter and prescription medicines only as told by your health care provider.  When lying down, lie on your left side. This keeps pressure off your  major blood vessels.  When sitting or lying down, raise (elevate) your feet. Try putting some pillows underneath your lower legs.  Exercise regularly. Ask your health care provider what kinds of exercise are best for you.  Keep all follow-up and prenatal visits as told by your health care provider. This is important. How is this prevented? There is no known way of preventing preeclampsia or eclampsia from developing. However, to lower your risk of complications and detect problems early:  Get regular prenatal care. Your health care provider may be able to diagnose and treat the condition early.  Maintain a healthy weight. Ask your health care provider for help managing weight gain during pregnancy.  Work with your health care provider to manage any long-term (chronic) health conditions you have, such as diabetes or kidney problems.  You may have tests of your blood pressure and kidney function after giving birth.  Your health care provider may have you take low-dose aspirin during your next pregnancy. Contact a health care provider if:  You have symptoms that your health care provider told you may require more treatment or monitoring, such as: ? Headaches. ? Nausea or vomiting. ? Abdominal pain. ? Dizziness. ? Light-headedness. Get help right away if:  You have severe: ? Abdominal pain. ? Headaches that do not get better. ? Dizziness. ? Vision problems. ? Confusion. ? Nausea or vomiting.  You have any of the following: ? A seizure. ? Sudden, rapid weight gain. ? Sudden swelling in your hands, ankles, or face. ? Trouble moving any part of your body. ? Numbness in any part of your body. ? Trouble speaking. ? Abnormal bleeding.  You faint. Summary  Preeclampsia is a serious condition that may develop during pregnancy.  This condition causes high blood pressure and increased protein in your urine along with other symptoms, such as headaches and vision  changes.  Diagnosing and treating preeclampsia early is very important. If not treated early, it can cause serious problems for you and your baby.  Get help right away if you have symptoms that your health care provider told you to watch for. This information is not intended to replace advice given to you by your health care provider. Make sure you discuss any questions you have with your health care provider. Document Revised: 08/12/2018 Document Reviewed: 07/16/2016 Elsevier Patient Education  2020 [employer]  

## 2020-01-20 LAB — CBC
Hematocrit: 38.4 % (ref 34.0–46.6)
Hemoglobin: 12.8 g/dL (ref 11.1–15.9)
MCH: 28.3 pg (ref 26.6–33.0)
MCHC: 33.3 g/dL (ref 31.5–35.7)
MCV: 85 fL (ref 79–97)
Platelets: 211 10*3/uL (ref 150–450)
RBC: 4.53 x10E6/uL (ref 3.77–5.28)
RDW: 14.2 % (ref 11.7–15.4)
WBC: 10.1 10*3/uL (ref 3.4–10.8)

## 2020-01-20 LAB — PROTEIN / CREATININE RATIO, URINE
Creatinine, Urine: 60.4 mg/dL
Protein, Ur: 68.7 mg/dL
Protein/Creat Ratio: 1137 mg/g creat — ABNORMAL HIGH (ref 0–200)

## 2020-01-20 LAB — COMPREHENSIVE METABOLIC PANEL
ALT: 19 IU/L (ref 0–32)
AST: 21 IU/L (ref 0–40)
Albumin/Globulin Ratio: 1.3 (ref 1.2–2.2)
Albumin: 3.4 g/dL — ABNORMAL LOW (ref 3.9–5.0)
Alkaline Phosphatase: 108 IU/L (ref 39–117)
BUN/Creatinine Ratio: 14 (ref 9–23)
BUN: 10 mg/dL (ref 6–20)
Bilirubin Total: 0.2 mg/dL (ref 0.0–1.2)
CO2: 18 mmol/L — ABNORMAL LOW (ref 20–29)
Calcium: 9.6 mg/dL (ref 8.7–10.2)
Chloride: 103 mmol/L (ref 96–106)
Creatinine, Ser: 0.73 mg/dL (ref 0.57–1.00)
GFR calc Af Amer: 134 mL/min/{1.73_m2} (ref >59)
GFR calc non Af Amer: 116 mL/min/{1.73_m2} (ref >59)
Globulin, Total: 2.7 g/dL (ref 1.5–4.5)
Glucose: 111 mg/dL — ABNORMAL HIGH (ref 65–99)
Potassium: 4.5 mmol/L (ref 3.5–5.2)
Sodium: 137 mmol/L (ref 134–144)
Total Protein: 6.1 g/dL (ref 6.0–8.5)

## 2020-01-21 ENCOUNTER — Other Ambulatory Visit: Payer: BLUE CROSS/BLUE SHIELD

## 2020-01-21 LAB — STREP GP B NAA: Strep Gp B NAA: POSITIVE — AB

## 2020-01-21 LAB — GC/CHLAMYDIA PROBE AMP
Chlamydia trachomatis, NAA: NEGATIVE
Neisseria Gonorrhoeae by PCR: NEGATIVE

## 2020-01-22 ENCOUNTER — Ambulatory Visit (INDEPENDENT_AMBULATORY_CARE_PROVIDER_SITE_OTHER): Payer: BLUE CROSS/BLUE SHIELD | Admitting: Obstetrics and Gynecology

## 2020-01-22 ENCOUNTER — Telehealth: Payer: Self-pay

## 2020-01-22 ENCOUNTER — Other Ambulatory Visit: Payer: BLUE CROSS/BLUE SHIELD

## 2020-01-22 ENCOUNTER — Other Ambulatory Visit: Payer: Self-pay

## 2020-01-22 ENCOUNTER — Encounter: Payer: BLUE CROSS/BLUE SHIELD | Admitting: Obstetrics and Gynecology

## 2020-01-22 VITALS — BP 115/79 | HR 97 | Wt 284.6 lb

## 2020-01-22 DIAGNOSIS — O24419 Gestational diabetes mellitus in pregnancy, unspecified control: Secondary | ICD-10-CM

## 2020-01-22 DIAGNOSIS — Z3A36 36 weeks gestation of pregnancy: Secondary | ICD-10-CM | POA: Diagnosis not present

## 2020-01-22 DIAGNOSIS — O99213 Obesity complicating pregnancy, third trimester: Secondary | ICD-10-CM

## 2020-01-22 DIAGNOSIS — Z3493 Encounter for supervision of normal pregnancy, unspecified, third trimester: Secondary | ICD-10-CM

## 2020-01-22 DIAGNOSIS — O1403 Mild to moderate pre-eclampsia, third trimester: Secondary | ICD-10-CM

## 2020-01-22 LAB — POCT URINALYSIS DIPSTICK OB
Bilirubin, UA: NEGATIVE
Glucose, UA: NEGATIVE
Nitrite, UA: NEGATIVE
Spec Grav, UA: 1.015 (ref 1.010–1.025)
Urobilinogen, UA: 0.2 E.U./dL
pH, UA: 6 (ref 5.0–8.0)

## 2020-01-22 NOTE — Progress Notes (Signed)
ROB with NST:  No complaints. Discussed recent lab results with patient, increasing proteinuria. Discussed induction of labor with patient after 37 weeks. Scheduled for 01/27/2019 at midnight. Will return on Wednesday for NST and will place [place] in office. NST performed today was reviewed and was found to be reactive.  Continue recommended antenatal testing and prenatal care.    NONSTRESS TEST INTERPRETATION  INDICATIONS: Preeclampsia (without severe symptoms), GDM and Obesity  FHR baseline: 145 bpm RESULTS:Reactive COMMENTS: no contractions   PLAN: 1. Continue fetal kick counts twice a day. 2. Continue antepartum testing as scheduled-Biweekly

## 2020-01-22 NOTE — Telephone Encounter (Signed)
Error

## 2020-01-22 NOTE — Telephone Encounter (Signed)
No answer record message stated that the person you are trying to call is unavailable at this time. Sent pt a [employer] geologist.

## 2020-01-22 NOTE — Patient Instructions (Signed)
COVID 19 Instructions for Scheduled Procedure (Inductions/C-sections and GYN surgeries)   Thank you for choosing [facility] for your services.  You have been scheduled for a procedure called ____________Induction of Labor_____________________________.    Your procedure is scheduled on ___________Thursday, February 4th at midgnight________ .  You are required to have COVID-19 testing performed 2 days prior to your scheduled procedure date.  Testing is performed between 9 and 10 AM Monday through Friday.  Please present for testing on _____Monday, February 1, 2021___________________ during this hour. Drive up testing is performed in front of the [place] (this is next to the [employer]).   Upon your scheduled procedure date, you will need to arrive at the [place] entrance. (There is a statue at the front of this entrance.)   Please arrive on time if you are scheduled for an induction of labor.   If you are scheduled for a Cesarean delivery or for Gyn Surgery, arrive 2 hours prior to your procedure time.   If you are an Obstetric patient and your arrival time falls between 11 PM and 6 AM call L&D [phone]) when you arrive.  A staff member will meet you at the [place] entrance.  At this time, patients are allowed 1 support person to accompany them. Face masks are required for you and your support person. Your support person is now allowed to be there with you during the entire time of your admission.   Please contact the office if you have any questions regarding this information.  The [facility] office number is [phone].     Thank you,    Your [facility] Providers        Labor Induction  Labor induction is when steps are taken to cause a pregnant woman to begin the labor process. Most women go into labor on their own between 37 weeks and 42 weeks of pregnancy. When this does not happen or when there is a medical need for labor to  begin, steps may be taken to induce labor. Labor induction causes a pregnant woman's uterus to contract. It also causes the cervix to soften (ripen), open (dilate), and thin out (efface). Usually, labor is not induced before 39 weeks of pregnancy unless there is a medical reason to do so. Your health care provider will determine if labor induction is needed. Before inducing labor, your health care provider will consider a number of factors, including:  Your medical condition and your baby's.  How many weeks along you are in your pregnancy.  How mature your baby's lungs are.  The condition of your cervix.  The position of your baby.  The size of your birth canal. What are some reasons for labor induction? Labor may be induced if:  Your health or your baby's health is at risk.  Your pregnancy is overdue by 1 week or more.  Your water breaks but labor does not start on its own.  There is a low amount of amniotic fluid around your baby. You may also choose (elect) to have labor induced at a certain time. Generally, elective labor induction is done no earlier than 39 weeks of pregnancy. What methods are used for labor induction? Methods used for labor induction include:  Prostaglandin medicine. This medicine starts contractions and causes the cervix to dilate and ripen. It can be taken by mouth (orally) or by being inserted into the vagina (suppository).  Inserting a small, thin tube (catheter) with a balloon  into the vagina and then expanding the balloon with water to dilate the cervix.  Stripping the membranes. In this method, your health care provider gently separates amniotic sac tissue from the cervix. This causes the cervix to stretch, which in turn causes the release of a hormone called progesterone. The hormone causes the uterus to contract. This procedure is often done during an office visit, after which you will be sent home to wait for contractions to begin.  Breaking the  water. In this method, your health care provider uses a small instrument to make a small hole in the amniotic sac. This eventually causes the amniotic sac to break. Contractions should begin after a few hours.  Medicine to trigger or strengthen contractions. This medicine is given through an IV that is inserted into a vein in your arm. Except for membrane stripping, which can be done in a clinic, labor induction is done in the hospital so that you and your baby can be carefully monitored. How long does it take for labor to be induced? The length of time it takes to induce labor depends on how ready your body is for labor. Some inductions can take up to 2-3 days, while others may take less than a day. Induction may take longer if:  You are induced early in your pregnancy.  It is your first pregnancy.  Your cervix is not ready. What are some risks associated with labor induction? Some risks associated with labor induction include:  Changes in fetal heart rate, such as being too high, too low, or irregular (erratic).  Failed induction.  Infection in the mother or the baby.  Increased risk of having a cesarean delivery.  Fetal death.  Breaking off (abruption) of the placenta from the uterus (rare).  Rupture of the uterus (very rare). When induction is needed for medical reasons, the benefits of induction generally outweigh the risks. What are some reasons for not inducing labor? Labor induction should not be done if:  Your baby does not tolerate contractions.  You have had previous surgeries on your uterus, such as a myomectomy, removal of fibroids, or a vertical scar from a previous cesarean delivery.  Your placenta lies very low in your uterus and blocks the opening of the cervix (placenta previa).  Your baby is not in a head-down position.  The umbilical cord drops down into the birth canal in front of the baby.  There are unusual circumstances, such as the baby being very  early (premature).  You have had more than 2 previous cesarean deliveries. Summary  Labor induction is when steps are taken to cause a pregnant woman to begin the labor process.  Labor induction causes a pregnant woman's uterus to contract. It also causes the cervix to ripen, dilate, and efface.  Labor is not induced before 39 weeks of pregnancy unless there is a medical reason to do so.  When induction is needed for medical reasons, the benefits of induction generally outweigh the risks. This information is not intended to replace advice given to you by your health care provider. Make sure you discuss any questions you have with your health care provider. Document Revised: 12/13/2017 Document Reviewed: 01/23/2017 Elsevier Patient Education  2020 [employer].

## 2020-01-23 ENCOUNTER — Other Ambulatory Visit: Payer: Self-pay | Admitting: Obstetrics and Gynecology

## 2020-01-25 ENCOUNTER — Other Ambulatory Visit: Payer: Self-pay

## 2020-01-25 ENCOUNTER — Other Ambulatory Visit
Admission: RE | Admit: 2020-01-25 | Discharge: 2020-01-25 | Disposition: A | Discharge status: Home / Self Care | Payer: BLUE CROSS/BLUE SHIELD | Source: Ambulatory Visit | Attending: Obstetrics and Gynecology | Admitting: Obstetrics and Gynecology

## 2020-01-25 DIAGNOSIS — Z01812 Encounter for preprocedural laboratory examination: Secondary | ICD-10-CM | POA: Insufficient documentation

## 2020-01-25 DIAGNOSIS — Z20822 Contact with and (suspected) exposure to covid-19: Secondary | ICD-10-CM | POA: Insufficient documentation

## 2020-01-25 LAB — SARS CORONAVIRUS 2 (TAT 6-24 HRS): SARS Coronavirus 2: NEGATIVE

## 2020-01-27 ENCOUNTER — Other Ambulatory Visit: Payer: BLUE CROSS/BLUE SHIELD

## 2020-01-27 ENCOUNTER — Other Ambulatory Visit: Payer: Self-pay

## 2020-01-27 ENCOUNTER — Ambulatory Visit (INDEPENDENT_AMBULATORY_CARE_PROVIDER_SITE_OTHER): Payer: BLUE CROSS/BLUE SHIELD | Admitting: Obstetrics and Gynecology

## 2020-01-27 ENCOUNTER — Encounter: Payer: BLUE CROSS/BLUE SHIELD | Admitting: Obstetrics and Gynecology

## 2020-01-27 ENCOUNTER — Encounter: Payer: Self-pay | Admitting: Obstetrics and Gynecology

## 2020-01-27 VITALS — BP 107/73 | HR 99 | Wt 293.0 lb

## 2020-01-27 DIAGNOSIS — O0993 Supervision of high risk pregnancy, unspecified, third trimester: Secondary | ICD-10-CM | POA: Diagnosis not present

## 2020-01-27 DIAGNOSIS — O1493 Unspecified pre-eclampsia, third trimester: Secondary | ICD-10-CM

## 2020-01-27 DIAGNOSIS — O99210 Obesity complicating pregnancy, unspecified trimester: Secondary | ICD-10-CM | POA: Diagnosis not present

## 2020-01-27 DIAGNOSIS — Z6841 Body Mass Index (BMI) 40.0 and over, adult: Secondary | ICD-10-CM | POA: Diagnosis not present

## 2020-01-27 DIAGNOSIS — O24419 Gestational diabetes mellitus in pregnancy, unspecified control: Secondary | ICD-10-CM

## 2020-01-27 LAB — POCT URINALYSIS DIPSTICK OB
Bilirubin, UA: NEGATIVE
Blood, UA: NEGATIVE
Glucose, UA: NEGATIVE
Ketones, UA: NEGATIVE
Nitrite, UA: NEGATIVE
Spec Grav, UA: <=1.005 — AB (ref 1.010–1.025)
Urobilinogen, UA: 0.2 E.U./dL
pH, UA: 6 (ref 5.0–8.0)

## 2020-01-27 NOTE — Progress Notes (Addendum)
FOLEY BULB PROCEDURE NOTE:    I have verified that the patient is an appropriate candidate for outpatient foley bulb placement.  She has consented to foley bulb placement today. She has no concerns at this time. A pre-procedure NST performed today was reviewed and was found to be reactive.     The patient was placed in the dorsal lithotomy position.  A speculum was inserted into the vagina and the cervix was visualized.  The cervix was noted to be 0.5 -1 cm dilated visually.  A foley catheter was advanced into the cervix slightly pass the internal cervical os.  The foley balloon was filled with 40 cc of normal saline.  Gentle traction was placed on the foley bulb, and the catheter was taped to the medial portion of the thigh.  The patient tolerated the procedure well.  A post-procedure NST was performed. Sterile technique was observed throughout.      NONSTRESS TEST INTERPRETATION  INDICATIONS: Foley bulb induction placement. Mild pre-eclampsia, Morbid obesity (BMI 53), GDM (uncontrolled on oral meds).   FHR baseline: 125 bpm (pre-procedure) and 145  bpm (post procedure) RESULTS:Reactive x 2. 1 spontaneous deceleration present (no ctx) from baseline to 90s, with recovery.  COMMENTS: No contractions present.    PLAN: 1. To arrive at [facility] and Delivery for scheduled induction of labor on at midnight tonight. She was given post-procedure instructions.

## 2020-01-27 NOTE — Patient Instructions (Signed)

## 2020-01-27 NOTE — Progress Notes (Signed)
ROB-pt present for routine prenatal care, NST and Foley. Pt stated having left pelvic pain no other issues.

## 2020-01-28 ENCOUNTER — Encounter: Payer: Self-pay | Admitting: Obstetrics and Gynecology

## 2020-01-28 ENCOUNTER — Inpatient Hospital Stay: Payer: Medicaid Other | Admitting: Certified Registered"

## 2020-01-28 ENCOUNTER — Inpatient Hospital Stay
Admission: RE | Admit: 2020-01-28 | Discharge: 2020-01-30 | DRG: 807 | Disposition: A | Discharge status: Home / Self Care | Payer: Medicaid Other | Attending: Obstetrics and Gynecology | Admitting: Obstetrics and Gynecology

## 2020-01-28 DIAGNOSIS — E669 Obesity, unspecified: Secondary | ICD-10-CM | POA: Diagnosis present

## 2020-01-28 DIAGNOSIS — O99214 Obesity complicating childbirth: Secondary | ICD-10-CM | POA: Diagnosis present

## 2020-01-28 DIAGNOSIS — Z3A37 37 weeks gestation of pregnancy: Secondary | ICD-10-CM | POA: Diagnosis not present

## 2020-01-28 DIAGNOSIS — O9081 Anemia of the puerperium: Secondary | ICD-10-CM | POA: Diagnosis not present

## 2020-01-28 DIAGNOSIS — O139 Gestational [pregnancy-induced] hypertension without significant proteinuria, unspecified trimester: Secondary | ICD-10-CM | POA: Diagnosis present

## 2020-01-28 DIAGNOSIS — O99824 Streptococcus B carrier state complicating childbirth: Secondary | ICD-10-CM | POA: Diagnosis present

## 2020-01-28 DIAGNOSIS — O1404 Mild to moderate pre-eclampsia, complicating childbirth: Secondary | ICD-10-CM | POA: Diagnosis present

## 2020-01-28 DIAGNOSIS — O24425 Gestational diabetes mellitus in childbirth, controlled by oral hypoglycemic drugs: Secondary | ICD-10-CM | POA: Diagnosis present

## 2020-01-28 DIAGNOSIS — O1400 Mild to moderate pre-eclampsia, unspecified trimester: Secondary | ICD-10-CM | POA: Diagnosis present

## 2020-01-28 DIAGNOSIS — O1414 Severe pre-eclampsia complicating childbirth: Secondary | ICD-10-CM | POA: Diagnosis present

## 2020-01-28 DIAGNOSIS — O1403 Mild to moderate pre-eclampsia, third trimester: Secondary | ICD-10-CM | POA: Diagnosis present

## 2020-01-28 DIAGNOSIS — Z6841 Body Mass Index (BMI) 40.0 and over, adult: Secondary | ICD-10-CM

## 2020-01-28 DIAGNOSIS — O1493 Unspecified pre-eclampsia, third trimester: Secondary | ICD-10-CM

## 2020-01-28 LAB — GLUCOSE, CAPILLARY
Glucose-Capillary: 75 mg/dL (ref 70–99)
Glucose-Capillary: 72 mg/dL (ref 70–99)
Glucose-Capillary: 88 mg/dL (ref 70–99)
Glucose-Capillary: 83 mg/dL (ref 70–99)
Glucose-Capillary: 103 mg/dL — ABNORMAL HIGH (ref 70–99)

## 2020-01-28 LAB — CBC
HCT: 38.4 % (ref 36.0–46.0)
Hemoglobin: 12.6 g/dL (ref 12.0–15.0)
MCH: 27.9 pg (ref 26.0–34.0)
MCHC: 32.8 g/dL (ref 30.0–36.0)
MCV: 85 fL (ref 80.0–100.0)
Platelets: 183 10*3/uL (ref 150–400)
RBC: 4.52 MIL/uL (ref 3.87–5.11)
RDW: 14.3 % (ref 11.5–15.5)
WBC: 9.4 10*3/uL (ref 4.0–10.5)
nRBC: 0 % (ref 0.0–0.2)

## 2020-01-28 LAB — TYPE AND SCREEN
ABO/RH(D): O POS
Antibody Screen: NEGATIVE

## 2020-01-28 LAB — ABO/RH: ABO/RH(D): O POS

## 2020-01-28 LAB — RPR
RPR Ser Ql: REACTIVE — AB
RPR Titer: NONREACTIVE

## 2020-01-28 MED ORDER — EPHEDRINE 5 MG/ML INJ: 10.0000 mg | INTRAVENOUS | Status: DC | PRN

## 2020-01-28 MED ORDER — ACETAMINOPHEN 325 MG PO TABS: 650.0000 mg | ORAL_TABLET | ORAL | Status: DC | PRN

## 2020-01-28 MED ORDER — LACTATED RINGERS IV SOLN
500.0000 mL | Freq: Once | INTRAVENOUS | Status: AC
  Administered 2020-01-28: 500 mL via INTRAVENOUS

## 2020-01-28 MED ORDER — BUTORPHANOL TARTRATE 1 MG/ML IJ SOLN: 1.0000 mg | INTRAMUSCULAR | Status: DC | PRN

## 2020-01-28 MED ORDER — BUPIVACAINE HCL (PF) 0.25 % IJ SOLN
INTRAMUSCULAR | Status: DC | PRN
  Administered 2020-01-28: 3 mL via EPIDURAL
  Administered 2020-01-28: 5 mL via EPIDURAL

## 2020-01-28 MED ORDER — LIDOCAINE-EPINEPHRINE (PF) 1.5 %-1:200000 IJ SOLN
INTRAMUSCULAR | Status: DC | PRN
  Administered 2020-01-28: 3 mL via EPIDURAL

## 2020-01-28 MED ORDER — LACTATED RINGERS IV SOLN: INTRAVENOUS | Status: DC

## 2020-01-28 MED ORDER — OXYTOCIN 40 UNITS IN NORMAL SALINE INFUSION - SIMPLE MED
1.0000 m[IU]/min | INTRAVENOUS | Status: DC
  Administered 2020-01-28: 1 m[IU]/min via INTRAVENOUS
  Filled 2020-01-28: qty 1000

## 2020-01-28 MED ORDER — FENTANYL 2.5 MCG/ML W/ROPIVACAINE 0.15% IN NS 100 ML EPIDURAL (ARMC)
EPIDURAL | Status: AC
  Administered 2020-01-28: 250 ug
  Filled 2020-01-28: qty 100

## 2020-01-28 MED ORDER — LIDOCAINE HCL (PF) 1 % IJ SOLN
30.0000 mL | INTRAMUSCULAR | Status: DC | PRN
  Filled 2020-01-28: qty 30

## 2020-01-28 MED ORDER — FENTANYL 2.5 MCG/ML W/ROPIVACAINE 0.15% IN NS 100 ML EPIDURAL (ARMC)
EPIDURAL | Status: DC | PRN
  Administered 2020-01-28: 12 mL/h via EPIDURAL

## 2020-01-28 MED ORDER — ACETAMINOPHEN 325 MG PO TABS
650.0000 mg | ORAL_TABLET | ORAL | Status: DC | PRN
  Administered 2020-01-28: 650 mg via ORAL
  Filled 2020-01-28: qty 2

## 2020-01-28 MED ORDER — LIDOCAINE HCL (PF) 1 % IJ SOLN
INTRAMUSCULAR | Status: DC | PRN
  Administered 2020-01-28: 3 mL via SUBCUTANEOUS

## 2020-01-28 MED ORDER — COCONUT OIL OIL: 1.0000 "application " | TOPICAL_OIL | Status: DC | PRN

## 2020-01-28 MED ORDER — PHENYLEPHRINE 40 MCG/ML (10ML) SYRINGE FOR IV PUSH (FOR BLOOD PRESSURE SUPPORT): 80.0000 ug | PREFILLED_SYRINGE | INTRAVENOUS | Status: DC | PRN

## 2020-01-28 MED ORDER — OXYCODONE-ACETAMINOPHEN 5-325 MG PO TABS: 1.0000 | ORAL_TABLET | ORAL | Status: DC | PRN

## 2020-01-28 MED ORDER — PRENATAL MULTIVITAMIN CH
1.0000 | ORAL_TABLET | Freq: Every day | ORAL | Status: DC
  Administered 2020-01-29 – 2020-01-30 (×2): 1 via ORAL
  Filled 2020-01-28 (×2): qty 1

## 2020-01-28 MED ORDER — SENNOSIDES-DOCUSATE SODIUM 8.6-50 MG PO TABS
2.0000 | ORAL_TABLET | ORAL | Status: DC
  Administered 2020-01-28 – 2020-01-29 (×2): 2 via ORAL
  Filled 2020-01-28 (×2): qty 2

## 2020-01-28 MED ORDER — OXYTOCIN BOLUS FROM INFUSION
500.0000 mL | Freq: Once | INTRAVENOUS | Status: AC
  Administered 2020-01-28: 500 mL via INTRAVENOUS

## 2020-01-28 MED ORDER — ONDANSETRON HCL 4 MG/2ML IJ SOLN: 4.0000 mg | INTRAMUSCULAR | Status: DC | PRN

## 2020-01-28 MED ORDER — MISOPROSTOL 200 MCG PO TABS
ORAL_TABLET | ORAL | Status: AC
  Filled 2020-01-28: qty 4

## 2020-01-28 MED ORDER — DIPHENHYDRAMINE HCL 25 MG PO CAPS: 25.0000 mg | ORAL_CAPSULE | Freq: Four times a day (QID) | ORAL | Status: DC | PRN

## 2020-01-28 MED ORDER — SOD CITRATE-CITRIC ACID 500-334 MG/5ML PO SOLN: 30.0000 mL | ORAL | Status: DC | PRN

## 2020-01-28 MED ORDER — FENTANYL 2.5 MCG/ML W/ROPIVACAINE 0.15% IN NS 100 ML EPIDURAL (ARMC)
12.0000 mL/h | EPIDURAL | Status: DC
  Administered 2020-01-28: 12 mL/h via EPIDURAL
  Filled 2020-01-28: qty 100

## 2020-01-28 MED ORDER — LACTATED RINGERS IV BOLUS: 1000.0000 mL | Freq: Once | INTRAVENOUS | Status: DC

## 2020-01-28 MED ORDER — OXYTOCIN 40 UNITS IN NORMAL SALINE INFUSION - SIMPLE MED
2.5000 [IU]/h | INTRAVENOUS | Status: DC
  Administered 2020-01-28: 2.5 [IU]/h via INTRAVENOUS

## 2020-01-28 MED ORDER — MISOPROSTOL 50MCG HALF TABLET
50.0000 ug | ORAL_TABLET | ORAL | Status: DC | PRN
  Administered 2020-01-28: 50 ug via VAGINAL
  Filled 2020-01-28: qty 1

## 2020-01-28 MED ORDER — ZOLPIDEM TARTRATE 5 MG PO TABS: 5.0000 mg | ORAL_TABLET | Freq: Every evening | ORAL | Status: DC | PRN

## 2020-01-28 MED ORDER — LACTATED RINGERS AMNIOINFUSION
INTRAVENOUS | Status: DC
  Filled 2020-01-28: qty 1000

## 2020-01-28 MED ORDER — ONDANSETRON HCL 4 MG PO TABS: 4.0000 mg | ORAL_TABLET | ORAL | Status: DC | PRN

## 2020-01-28 MED ORDER — DIBUCAINE (PERIANAL) 1 % EX OINT: 1.0000 "application " | TOPICAL_OINTMENT | CUTANEOUS | Status: DC | PRN

## 2020-01-28 MED ORDER — PENICILLIN G 3 MILLION UNITS IVPB - SIMPLE MED
3.0000 10*6.[IU] | INTRAVENOUS | Status: DC
  Administered 2020-01-28 (×2): 3 10*6.[IU] via INTRAVENOUS
  Filled 2020-01-28 (×2): qty 100

## 2020-01-28 MED ORDER — OXYCODONE-ACETAMINOPHEN 5-325 MG PO TABS: 2.0000 | ORAL_TABLET | ORAL | Status: DC | PRN

## 2020-01-28 MED ORDER — DIPHENHYDRAMINE HCL 50 MG/ML IJ SOLN: 12.5000 mg | INTRAMUSCULAR | Status: DC | PRN

## 2020-01-28 MED ORDER — BENZOCAINE-MENTHOL 20-0.5 % EX AERO
1.0000 "application " | INHALATION_SPRAY | CUTANEOUS | Status: DC | PRN
  Administered 2020-01-29: 1 via TOPICAL
  Filled 2020-01-28: qty 56

## 2020-01-28 MED ORDER — TERBUTALINE SULFATE 1 MG/ML IJ SOLN
INTRAMUSCULAR | Status: AC
  Administered 2020-01-28: 0.25 mg via SUBCUTANEOUS
  Filled 2020-01-28: qty 1

## 2020-01-28 MED ORDER — SIMETHICONE 80 MG PO CHEW: 80.0000 mg | CHEWABLE_TABLET | ORAL | Status: DC | PRN

## 2020-01-28 MED ORDER — LIDOCAINE HCL (PF) 1 % IJ SOLN
30.0000 mL | INTRAMUSCULAR | Status: DC | PRN
  Administered 2020-01-28: 30 mL via SUBCUTANEOUS

## 2020-01-28 MED ORDER — LACTATED RINGERS IV SOLN: 500.0000 mL | INTRAVENOUS | Status: DC | PRN

## 2020-01-28 MED ORDER — TERBUTALINE SULFATE 1 MG/ML IJ SOLN: 0.2500 mg | Freq: Once | INTRAMUSCULAR | Status: AC

## 2020-01-28 MED ORDER — SODIUM CHLORIDE 0.9 % IV SOLN
5.0000 10*6.[IU] | Freq: Once | INTRAVENOUS | Status: AC
  Administered 2020-01-28: 5 10*6.[IU] via INTRAVENOUS
  Filled 2020-01-28: qty 5

## 2020-01-28 MED ORDER — OXYTOCIN 10 UNIT/ML IJ SOLN
INTRAMUSCULAR | Status: AC
  Filled 2020-01-28: qty 2

## 2020-01-28 MED ORDER — OXYTOCIN 40 UNITS IN NORMAL SALINE INFUSION - SIMPLE MED: 2.5000 [IU]/h | INTRAVENOUS | Status: DC

## 2020-01-28 MED ORDER — ONDANSETRON HCL 4 MG/2ML IJ SOLN: 4.0000 mg | Freq: Four times a day (QID) | INTRAMUSCULAR | Status: DC | PRN

## 2020-01-28 MED ORDER — ONDANSETRON HCL 4 MG/2ML IJ SOLN
4.0000 mg | Freq: Four times a day (QID) | INTRAMUSCULAR | Status: DC | PRN
  Administered 2020-01-28: 4 mg via INTRAVENOUS
  Filled 2020-01-28: qty 2

## 2020-01-28 MED ORDER — AMMONIA AROMATIC IN INHA
RESPIRATORY_TRACT | Status: AC
  Filled 2020-01-28: qty 10

## 2020-01-28 MED ORDER — SOD CITRATE-CITRIC ACID 500-334 MG/5ML PO SOLN
30.0000 mL | ORAL | Status: DC | PRN
  Filled 2020-01-28: qty 30

## 2020-01-28 MED ORDER — WITCH HAZEL-GLYCERIN EX PADS: 1.0000 "application " | MEDICATED_PAD | CUTANEOUS | Status: DC | PRN

## 2020-01-28 MED ORDER — OXYTOCIN BOLUS FROM INFUSION: 500.0000 mL | Freq: Once | INTRAVENOUS | Status: DC

## 2020-01-28 MED ORDER — LACTATED RINGERS IV SOLN
500.0000 mL | INTRAVENOUS | Status: DC | PRN
  Administered 2020-01-28: 500 mL via INTRAVENOUS
  Administered 2020-01-28: 250 mL via INTRAVENOUS

## 2020-01-28 MED ORDER — IBUPROFEN 800 MG PO TABS
800.0000 mg | ORAL_TABLET | Freq: Four times a day (QID) | ORAL | Status: DC
  Administered 2020-01-28 – 2020-01-30 (×7): 800 mg via ORAL
  Filled 2020-01-28 (×7): qty 1

## 2020-01-28 MED ORDER — TERBUTALINE SULFATE 1 MG/ML IJ SOLN: 0.2500 mg | Freq: Once | INTRAMUSCULAR | Status: DC | PRN

## 2020-01-28 NOTE — Anesthesia Procedure Notes (Signed)
Epidural Patient location during procedure: OB  Staffing Anesthesiologist: Corinda Gubler, MD Resident/CRNA: Mathews Argyle, CRNA Performed: resident/CRNA   Preanesthetic Checklist Completed: patient identified, IV checked, site marked, risks and benefits discussed, surgical consent, monitors and equipment checked, pre-op evaluation and timeout performed  Epidural Patient position: sitting Prep: ChloraPrep and site prepped and draped Patient monitoring: heart rate, continuous pulse ox and blood pressure Approach: midline Location: L4-L5 Injection technique: LOR saline  Needle:  Needle type: Tuohy  Needle gauge: 17 G Needle length: 9 cm and 9 Needle insertion depth: 9 cm Catheter type: closed end flexible Catheter size: 19 Gauge Catheter at skin depth: 15 cm Test dose: negative and 1.5% lidocaine with Epi 1:200 K  Assessment Events: blood not aspirated, injection not painful, no injection resistance, no paresthesia and negative IV test  Additional Notes   Patient tolerated the insertion well without complications.Reason for block:procedure for pain

## 2020-01-28 NOTE — Progress Notes (Signed)
Intrapartum Progress Note  S: Patient feeling better, just had epidural placed.   O: Blood pressure 139/79, pulse 88, temperature 98.3 F (36.8 C), temperature source Oral, resp. rate 18, height 5\' 2"  (1.575 m), weight 132.9 kg, last menstrual period 11/06/2018, SpO2 99 %. Gen App: NAD, comfortable Abdomen: soft, gravid FHT: baseline 135 bpm.  Accels present.  Decels absent. moderate in degree variability.   Tocometer: contractions q 2-4 minutes, irregular Cervix: 4.5/60/-1/AROM'd at last check 8:00 a.m. Extremities: Nontender, no edema.  Pitocin: 3 mIU  Labs: No new labs   Assessment:  1: SIUP at [redacted]w[redacted]d 2. Gestational DM (Glyburide) 3. Mild pre-eclampsia  Plan:  1. Continue IOL.  Internal monitors placed (FSE and IUPC) to better monitor fetus as difficulties noted during the morning.  2.Continue to monitor BPs. Will treat for severe range BPs.  3. Continue monitoring glucose levels q 4 hrs.  4. Anticipate vaginal delivery   [redacted]w[redacted]d, MD 01/28/2020 12:33 PM

## 2020-01-28 NOTE — H&P (Signed)
Obstetric History and Physical  Dominique Sanders is a 24 y.o. G1P0 with IUP at [redacted]w[redacted]d presenting for scheduled induction of labor for pre-eclampsia without severe features and gestational diabetes (uncontrolled on Glyburide). Patient states she has been having  irregular contractions since her foley bulb was expelled (placed in the office yesterday), minimal vaginal bleeding, intact membranes, with active fetal movement.    Prenatal Course Source of Care: [facility] with onset of care at 11 weeks Pregnancy complications or risks: Patient Active Problem List   Diagnosis Date Noted  . Mild pre-eclampsia in third trimester 01/28/2020  . Gestational hypertension 01/28/2020  . Preeclampsia 01/08/2020  . Excessive weight gain affecting pregnancy 10/15/2019  . Gestational diabetes mellitus (GDM) in second trimester 10/15/2019  . Obesity in pregnancy 08/11/2019  . BMI 50.0-59.9, adult (HCC) 08/11/2019   She plans to bottle feed She desires undecided method for postpartum contraception.   Prenatal labs and studies: ABO, Rh: --/--/O POS Performed at [facility], [address] [place], [place] [address]  [phone]) Antibody: NEG (02/04 0038) Rubella: 9.04 (08/18 0947) RPR: Non Reactive (08/18 0947)  HBsAg: Negative (08/18 0947)  HIV: Non Reactive (08/18 0947)  GBS:--/Positive (01/26 1508) 1 hr Glucola  179 (abnormal), 3 hr GTT 2 of 3 values abnormal.  Genetic screening normal Anatomy US normal    History reviewed. No pertinent past medical history.  Past Surgical History:  Procedure Laterality Date  . no surgical       OB History  Gravida Para Term Preterm AB Living  1            SAB TAB Ectopic Multiple Live Births               # Outcome Date GA Lbr Len/2nd Weight Sex Delivery Anes PTL Lv  1 Current             Social History   Socioeconomic History  . Marital status: Single    Spouse name: Not on file  . Number of children: Not on file  . Years  of education: Not on file  . Highest education level: Not on file  Occupational History  . Not on file  Tobacco Use  . Smoking status: Never Smoker  . Smokeless tobacco: Never Used  Substance and Sexual Activity  . Alcohol use: Not Currently  . Drug use: No  . Sexual activity: Not Currently    Birth control/protection: None  Other Topics Concern  . Not on file  Social History Narrative  . Not on file   Social Determinants of Health   Financial Resource Strain:   . Difficulty of Paying Living Expenses: Not on file  Food Insecurity:   . Worried About Programme researcher, broadcasting/film/video in the Last Year: Not on file  . Ran Out of Food in the Last Year: Not on file  Transportation Needs:   . Lack of Transportation (Medical): Not on file  . Lack of Transportation (Non-Medical): Not on file  Physical Activity:   . Days of Exercise per Week: Not on file  . Minutes of Exercise per Session: Not on file  Stress:   . Feeling of Stress : Not on file  Social Connections:   . Frequency of Communication with Friends and Family: Not on file  . Frequency of Social Gatherings with Friends and Family: Not on file  . Attends Religious Services: Not on file  . Active Member of Clubs or Organizations: Not on file  .  Attends Banker Meetings: Not on file  . Marital Status: Not on file    Family History  Problem Relation Age of Onset  . Healthy Mother   . Healthy Father     Medications Prior to Admission  Medication Sig Dispense Refill Last Dose  . glucose blood (CONTOUR NEXT TEST) test strip Use as instructed 100 each 12   . glyBURIDE (DIABETA) 5 MG tablet Take 2 tablets (10 mg total) by mouth 2 (two) times daily with a meal. 120 tablet 1   . metFORMIN (GLUCOPHAGE) 500 MG tablet Take 1.5 tablets (750 mg total) by mouth 2 (two) times daily with a meal. 500 at bedtime for 1 wk.  Then increase to twice per day with meals as directed. (Patient not taking: Reported on 01/19/2020) 90 tablet 0 Not  Taking at Unknown time  . Microlet Lancets MISC 1 each by Does not apply route 4 (four) times daily. 100 each 6   . Prenatal Vit-Fe Fumarate-FA (PRENATAL MULTIVITAMIN) TABS tablet Take 1 tablet by mouth daily at 12 noon.       No Known Allergies    Review of Systems: Negative except for what is mentioned in HPI.  Physical Exam: Vitals:   01/28/20 0107 01/28/20 0431 01/28/20 0433 01/28/20 0730  BP: 125/77  (!) 116/56   Pulse: 100  96   Resp:  16  16  Temp:  98.5 F (36.9 C)  98.1 F (36.7 C)  TempSrc:  Oral  Oral  SpO2:      Weight:      Height:        CONSTITUTIONAL: Well-developed, well-nourished female in no acute distress.  HENT:  Normocephalic, atraumatic, External right and left ear normal. Oropharynx is clear and moist EYES: Conjunctivae and EOM are normal. Pupils are equal, round, and reactive to light. No scleral icterus.  NECK: Normal range of motion, supple, no masses SKIN: Skin is warm and dry. No rash noted. Not diaphoretic. No erythema. No pallor. NEUROLOGIC: Alert and oriented to person, place, and time. Normal reflexes, muscle tone coordination. No cranial nerve deficit noted. PSYCHIATRIC: Normal mood and affect. Normal behavior. Normal judgment and thought content. CARDIOVASCULAR: Normal heart rate noted, regular rhythm RESPIRATORY: Effort and breath sounds normal, no problems with respiration noted ABDOMEN: Soft, nontender, nondistended, gravid. MUSCULOSKELETAL: Normal range of motion. No edema and no tenderness. 2+ distal pulses.  Cervical Exam: Dilatation 4cm   Effacement 60%   Station -2   Presentation: cephalic FHT:  Baseline rate 145 bpm   Variability moderate  Accelerations present   Decelerations occasional variable Contractions: Irregular, with uterine irritability.    Pertinent Labs/Studies:   Results for orders placed or performed during the hospital encounter of 01/28/20 (from the past 24 hour(s))  CBC     Status: None   Collection Time:  01/28/20 12:38 AM  Result Value Ref Range   WBC 9.4 4.0 - 10.5 K/uL   RBC 4.52 3.87 - 5.11 MIL/uL   Hemoglobin 12.6 12.0 - 15.0 g/dL   HCT 12.7 51.7 - 00.1 %   MCV 85.0 80.0 - 100.0 fL   MCH 27.9 26.0 - 34.0 pg   MCHC 32.8 30.0 - 36.0 g/dL   RDW 74.9 44.9 - 67.5 %   Platelets 183 150 - 400 K/uL   nRBC 0.0 0.0 - 0.2 %  Type and screen     Status: None   Collection Time: 01/28/20 12:38 AM  Result Value Ref Range  ABO/RH(D) O POS    Antibody Screen NEG    Sample Expiration      01/31/2020,2359 Performed at [facility], [address] [place], [place] [address]   Glucose, capillary     Status: None   Collection Time: 01/28/20  1:02 AM  Result Value Ref Range   Glucose-Capillary 75 70 - 99 mg/dL  ABO/Rh     Status: None   Collection Time: 01/28/20  2:21 AM  Result Value Ref Range   ABO/RH(D)      O POS Performed at [facility], [place], [place] [address]   Glucose, capillary     Status: None   Collection Time: 01/28/20  3:26 AM  Result Value Ref Range   Glucose-Capillary 72 70 - 99 mg/dL    Assessment : Dominique Sanders is a 24 y.o. G1P0 at [redacted]w[redacted]d being admitted for induction of labor due to mild pre-eclampsia, gestational DM (oral meds, uncontrolled).  Plan: Labor: Office foley bulb induction yesterday, received first dose of cytotec overnight.  Terbutaline given due to Category II tracing and uterine irritability at ~ 0600.  Will resume induction as fetal tracing more reassuring. Will start Pitocin. Analgesia as needed. Continue to monitor blood glucose. Will decrease to q 4 hr glucose checks as blood sugars have been much more controlled while inpatient. AROM'd with blood tinged fluid present.  FWB: Reassuring fetal heart tracing now.  GBS positive. Penicillin G for GBS prophylaxis.  Delivery plan: Hopeful for vaginal delivery    Dominique Maid, MD [facility]

## 2020-01-28 NOTE — Anesthesia Preprocedure Evaluation (Signed)
Anesthesia Evaluation  Patient identified by MRN, date of birth, ID band Patient awake    Reviewed: Allergy & Precautions, H&P , NPO status , Patient's Chart, lab work & pertinent test results  Airway Mallampati: III  TM Distance: >3 FB Neck ROM: full    Dental  (+) Dental Advidsory Given   Pulmonary neg pulmonary ROS,           Cardiovascular hypertension,      Neuro/Psych Anxiety negative neurological ROS     GI/Hepatic Neg liver ROS, GERD  ,  Endo/Other  diabetes  Renal/GU negative Renal ROS  negative genitourinary   Musculoskeletal   Abdominal   Peds  Hematology negative hematology ROS (+)   Anesthesia Other Findings   Reproductive/Obstetrics (+) Pregnancy                             Anesthesia Physical Anesthesia Plan  ASA: II  Anesthesia Plan: Epidural   Post-op Pain Management:    Induction:   PONV Risk Score and Plan:   Airway Management Planned:   Additional Equipment:   Intra-op Plan:   Post-operative Plan:   Informed Consent: I have reviewed the patients History and Physical, chart, labs and discussed the procedure including the risks, benefits and alternatives for the proposed anesthesia with the patient or authorized representative who has indicated his/her understanding and acceptance.       Plan Discussed with: CRNA  Anesthesia Plan Comments:         Anesthesia Quick Evaluation

## 2020-01-28 NOTE — Progress Notes (Addendum)
Intrapartum Progress Note  S: Called by nurse to assess patient due to repetitive deep variables, Category II tracing after multiple position changes.  O: Blood pressure 139/79, pulse 88, temperature 98.3 F (36.8 C), temperature source Oral, resp. rate 18, height 5\' 2"  (1.575 m), weight 132.9 kg, last menstrual period 11/06/2018, SpO2 98 %. Gen App: NAD, comfortable Abdomen: soft, gravid FHT: baseline 130 bpm.  Accels absent.  Decels present (recurrent variables from baseline to 60s). Moderate in degree variability.   Tocometer: contractions q 2-3 minutes,  Cervix: 9/90/0/AROM Extremities: Nontender, +1 edema.  Pitocin: None  Labs: No new labs   Assessment:  1: SIUP at [redacted]w[redacted]d 2. Gestational DM (Glyburide) 3. Mild pre-eclampsia 4. GBS+ 5. Category II tracing  Plan:  1. Upon my check, patient with anterior and left side lip of cervix remaining.  Attempted to push to complete without success for ~ 30 minutes, however did resolve variable decelerations along with initiation of an amnioinfusion.  Tracing has returned to Category 1.  Will allow patient to labor down for remainder of cervical change. Can resume Pitocin at low dose.  2.Continue to monitor BPs. Will treat for severe range BPs.  3. Continue monitoring glucose levels q 4 hrs.  4. Anticipate vaginal delivery soon   [redacted]w[redacted]d, MD [facility]

## 2020-01-29 LAB — CBC
HCT: 32.2 % — ABNORMAL LOW (ref 36.0–46.0)
Hemoglobin: 10.4 g/dL — ABNORMAL LOW (ref 12.0–15.0)
MCH: 28.3 pg (ref 26.0–34.0)
MCHC: 32.3 g/dL (ref 30.0–36.0)
MCV: 87.5 fL (ref 80.0–100.0)
Platelets: 161 10*3/uL (ref 150–400)
RBC: 3.68 MIL/uL — ABNORMAL LOW (ref 3.87–5.11)
RDW: 14.2 % (ref 11.5–15.5)
WBC: 12.1 10*3/uL — ABNORMAL HIGH (ref 4.0–10.5)
nRBC: 0 % (ref 0.0–0.2)

## 2020-01-29 LAB — GLUCOSE, CAPILLARY
Glucose-Capillary: 121 mg/dL — ABNORMAL HIGH (ref 70–99)
Glucose-Capillary: 133 mg/dL — ABNORMAL HIGH (ref 70–99)
Glucose-Capillary: 72 mg/dL (ref 70–99)
Glucose-Capillary: 78 mg/dL (ref 70–99)

## 2020-01-29 LAB — T.PALLIDUM AB, TOTAL: T Pallidum Abs: NONREACTIVE

## 2020-01-29 NOTE — Progress Notes (Signed)
Post Partum Day # 1, s/p SVD  Subjective: no complaints, up ad lib, voiding and tolerating PO  Objective: Temp:  [98 F (36.7 C)-99 F (37.2 C)] 98.3 F (36.8 C) (02/05 0445) Pulse Rate:  [88-131] 93 (02/05 0445) Resp:  [18-21] 18 (02/05 0445) BP: (100-154)/(44-94) 117/77 (02/05 0445) SpO2:  [95 %-100 %] 100 % (02/05 0445)  Physical Exam:  General: alert and no distress  Lungs: clear to auscultation bilaterally Breasts: normal appearance, no masses or tenderness Heart: regular rate and rhythm, S1, S2 normal, no murmur, click, rub or gallop Abdomen: soft, non-tender; bowel sounds normal; no masses,  no organomegaly Pelvis: Lochia: appropriate, Uterine Fundus: firm Extremities: DVT Evaluation: No evidence of DVT seen on physical exam. Negative Homan's sign.  No cords or calf tenderness. Calf/Ankle edema is present (+1 to +2).  Recent Labs    01/28/20 0038 01/29/20 0525  HGB 12.6 10.4*  HCT 38.4 32.2*    Assessment/Plan: Doing well postpartum.  Continue to monitor BPs for mild pre-eclampsia.  Will monitor blood sugars today for h/o GDM Bottle feeding.  Contraception undecided.  Mild anemia postpartum, asymptomatic.  Can treat with PO supplementation.  Likely d/c home tomorrow.    LOS: 1 day   Hildred Laser, MD [facility] 01/29/2020 7:58 AM

## 2020-01-29 NOTE — Anesthesia Postprocedure Evaluation (Signed)
Anesthesia Post Note  Patient: Dominique Sanders  Procedure(s) Performed: AN AD HOC LABOR EPIDURAL  Patient location during evaluation: Mother Baby Anesthesia Type: Epidural Level of consciousness: awake, awake and alert and oriented Pain management: pain level controlled Vital Signs Assessment: post-procedure vital signs reviewed and stable Respiratory status: spontaneous breathing, nonlabored ventilation and respiratory function stable Cardiovascular status: stable Postop Assessment: no headache, no backache, patient able to bend at knees, no apparent nausea or vomiting and adequate PO intake Anesthetic complications: no     Last Vitals:  Vitals:   01/29/20 0222 01/29/20 0445  BP: (!) 100/52 117/77  Pulse: 98 93  Resp: 18 18  Temp: 36.7 C 36.8 C  SpO2: 98% 100%    Last Pain:  Vitals:   01/29/20 0445  TempSrc: Oral  PainSc:                  Lyn Records

## 2020-01-30 LAB — GLUCOSE, CAPILLARY
Glucose-Capillary: 88 mg/dL (ref 70–99)
Glucose-Capillary: 127 mg/dL — ABNORMAL HIGH (ref 70–99)

## 2020-01-30 MED ORDER — IBUPROFEN 800 MG PO TABS: 800.0000 mg | ORAL_TABLET | Freq: Three times a day (TID) | ORAL | 1 refills | Status: DC | PRN

## 2020-01-30 NOTE — Progress Notes (Signed)
Discharge instructions given and prescriptions sent to the pharmacy by the provider. Patient verbalizes understanding of teaching. Patient discharged home via wheelchair at 1345.

## 2020-01-30 NOTE — Discharge Summary (Signed)
                              Discharge Summary  Date of Admission: 01/28/2020  Date of Discharge: 01/30/2020  Admitting Diagnosis: Induction of labor at [redacted]w[redacted]d  Mode of Delivery: normal spontaneous vaginal delivery                 Discharge Diagnosis: Preeclampsia (severe) and gestational diabetes    Post partum procedures:   Complications: none                      Discharge Day SOAP Note:  Progress Note - Vaginal Delivery  Dominique Sanders is a 24 y.o. G1P0 now PP day 2 s/p Vaginal, Spontaneous . Delivery was uncomplicated  Subjective  The patient has the following complaints: has no unusual complaints  Pain is controlled with current medications.   Patient is urinating without difficulty.  She is ambulating well.   Objective  Vital signs: BP 123/64 (BP Location: Left Arm)   Pulse 93   Temp 97.7 F (36.5 C) (Oral)   Resp 20   Ht 5\' 2"  (1.575 m)   Wt 132.9 kg   LMP 11/06/2018 (LMP Unknown)   SpO2 100%   BMI 53.59 kg/m   Physical Exam: Gen: NAD Fundus Fundal Tone: Firm  Lochia Amount: Small  Perineum Appearance: Approximated, Edematous     Data Review Labs: CBC Latest Ref Rng & Units 01/29/2020 01/28/2020 01/19/2020  WBC 4.0 - 10.5 K/uL 12.1(H) 9.4 10.1  Hemoglobin 12.0 - 15.0 g/dL 10.4(L) 12.6 12.8  Hematocrit 36.0 - 46.0 % 32.2(L) 38.4 38.4  Platelets 150 - 400 K/uL 161 183 211   O POS Performed at [facility], [address] [place], [place]   Assessment/Plan  Active Problems:   Mild pre-eclampsia in third trimester   Gestational hypertension    Plan for discharge today.   Discharge Instructions: Per After Visit Summary. Activity: Advance as tolerated. Pelvic rest for 6 weeks.  Also refer to After Visit Summary Diet: Regular Medications: Allergies as of 01/30/2020   No Known Allergies     Medication List    STOP taking these medications   Contour Next Test test strip Generic drug: glucose blood   glyBURIDE 5 MG  tablet Commonly known as: DIABETA   metFORMIN 500 MG tablet Commonly known as: GLUCOPHAGE   Microlet Lancets Misc     TAKE these medications   ibuprofen 800 MG tablet Commonly known as: ADVIL Take 1 tablet (800 mg total) by mouth every 8 (eight) hours as needed.   prenatal multivitamin Tabs tablet Take 1 tablet by mouth daily at 12 noon.      Outpatient follow up:  Follow-up Information    03/29/2020, MD. Schedule an appointment as soon as possible for a visit.   Specialties: Obstetrics and Gynecology, Radiology Why: 5 days BP check 6 weeks postpartum visit Contact information: [address] [place] [phone]          Postpartum contraception: Will discuss at first office visit post-partum  Discharged Condition: good  Discharged to: home  Newborn Data: Disposition:rooming in  Apgars: APGAR (1 MIN): 2   APGAR (5 MINS): 7   APGAR (10 MINS): 9    Baby Feeding: Bottle    [phone], M.D. 01/30/2020 9:43 AM

## 2020-01-30 NOTE — Discharge Instructions (Signed)

## 2020-02-01 LAB — GLUCOSE, CAPILLARY: Glucose-Capillary: 116 mg/dL — ABNORMAL HIGH (ref 70–99)

## 2020-02-01 NOTE — Progress Notes (Signed)
This patient is supposed to be scheduled for a BP check for later this week, Wednesday or Thursday as a nurse visit.  She also needs to be scheduled for a 6 week postpartum visit. She delivered last Thursday.   Thanks,   Dr. Valentino Saxon

## 2020-02-03 ENCOUNTER — Encounter: Payer: BLUE CROSS/BLUE SHIELD | Admitting: Obstetrics and Gynecology

## 2020-02-03 ENCOUNTER — Telehealth: Payer: Self-pay

## 2020-02-03 ENCOUNTER — Other Ambulatory Visit: Payer: BLUE CROSS/BLUE SHIELD

## 2020-02-03 NOTE — Telephone Encounter (Signed)
Pt called phone temporary out of service.

## 2020-08-25 IMAGING — DX DG HAND COMPLETE 3+V*R*
3 series · 3 of 3 positions shown · non-contrast
Comparison: None.

CLINICAL DATA: Pain and swelling no injury

EXAM:
RIGHT HAND - COMPLETE 3+ VIEW

[hand ap]
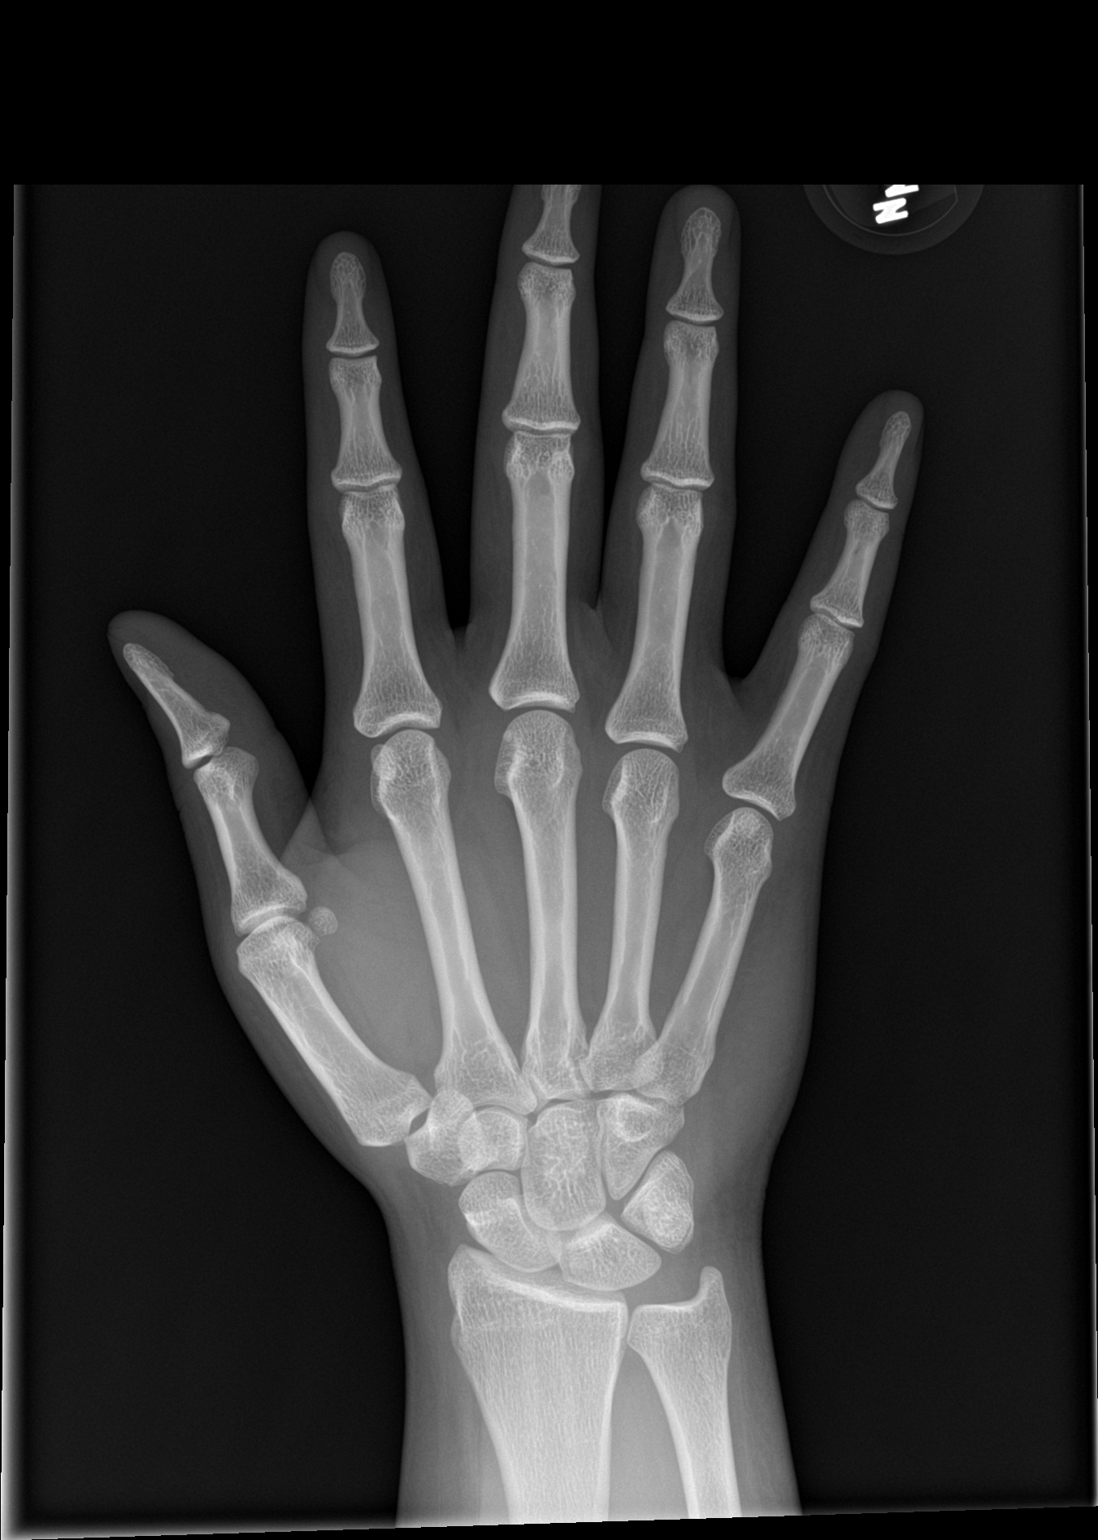

[hand obl]
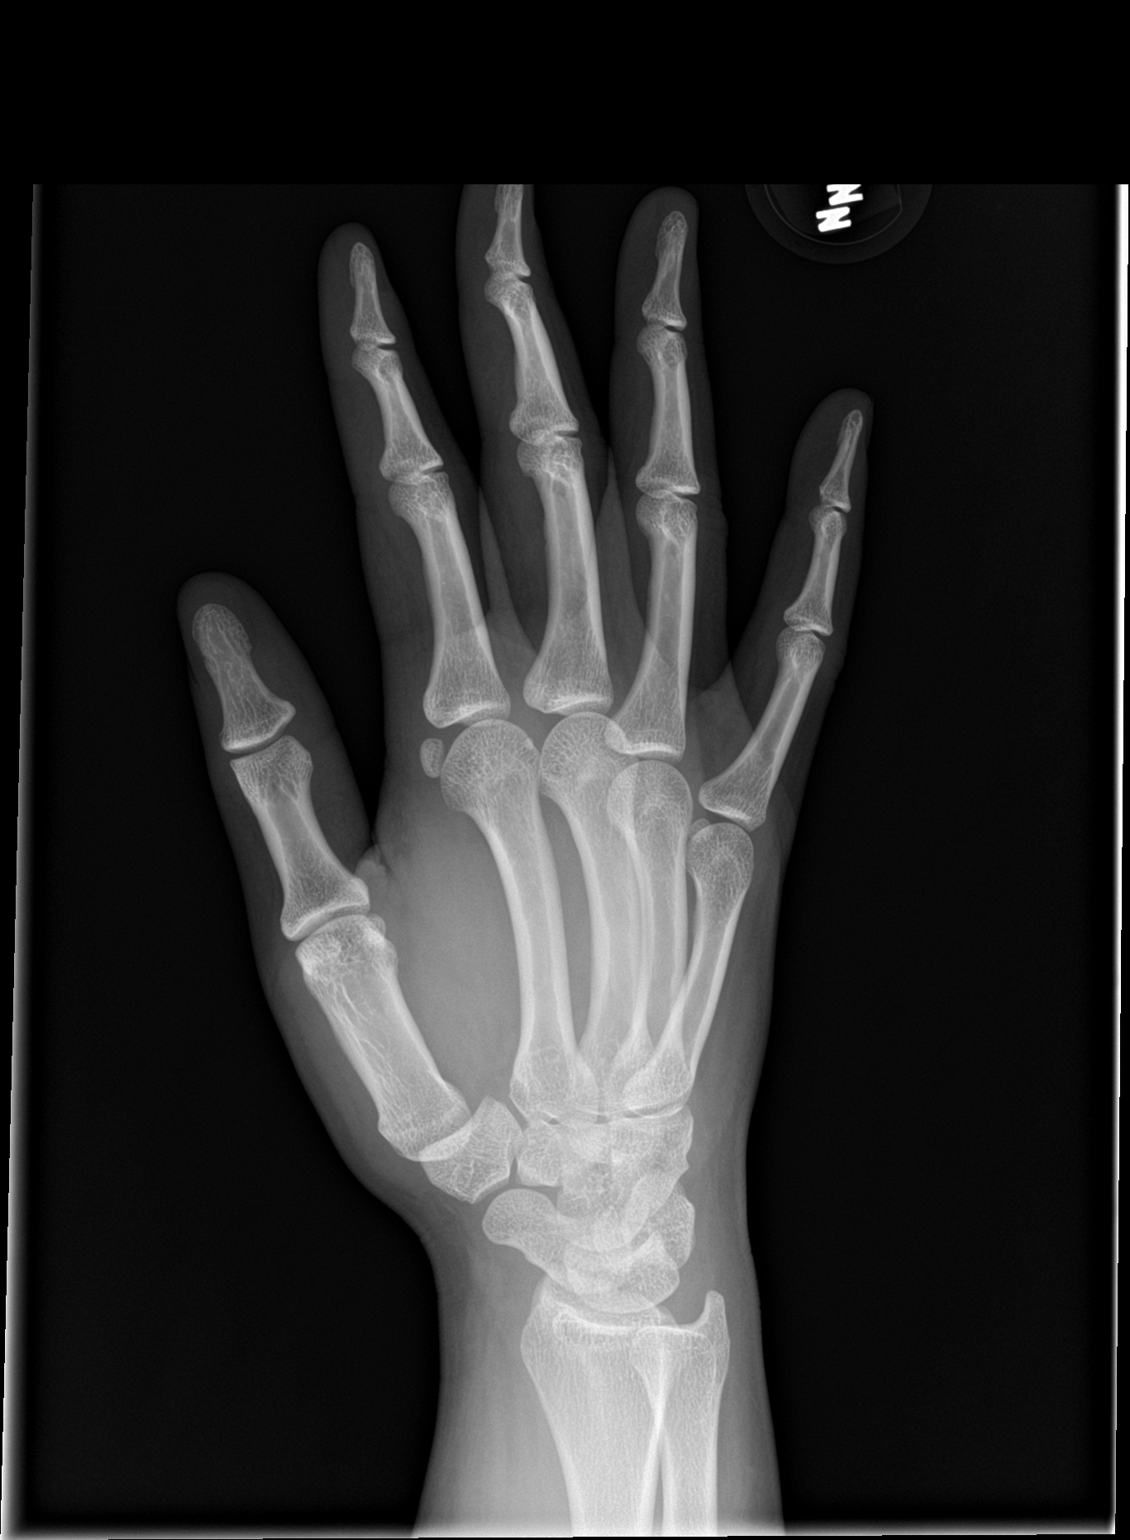

[hand lat]
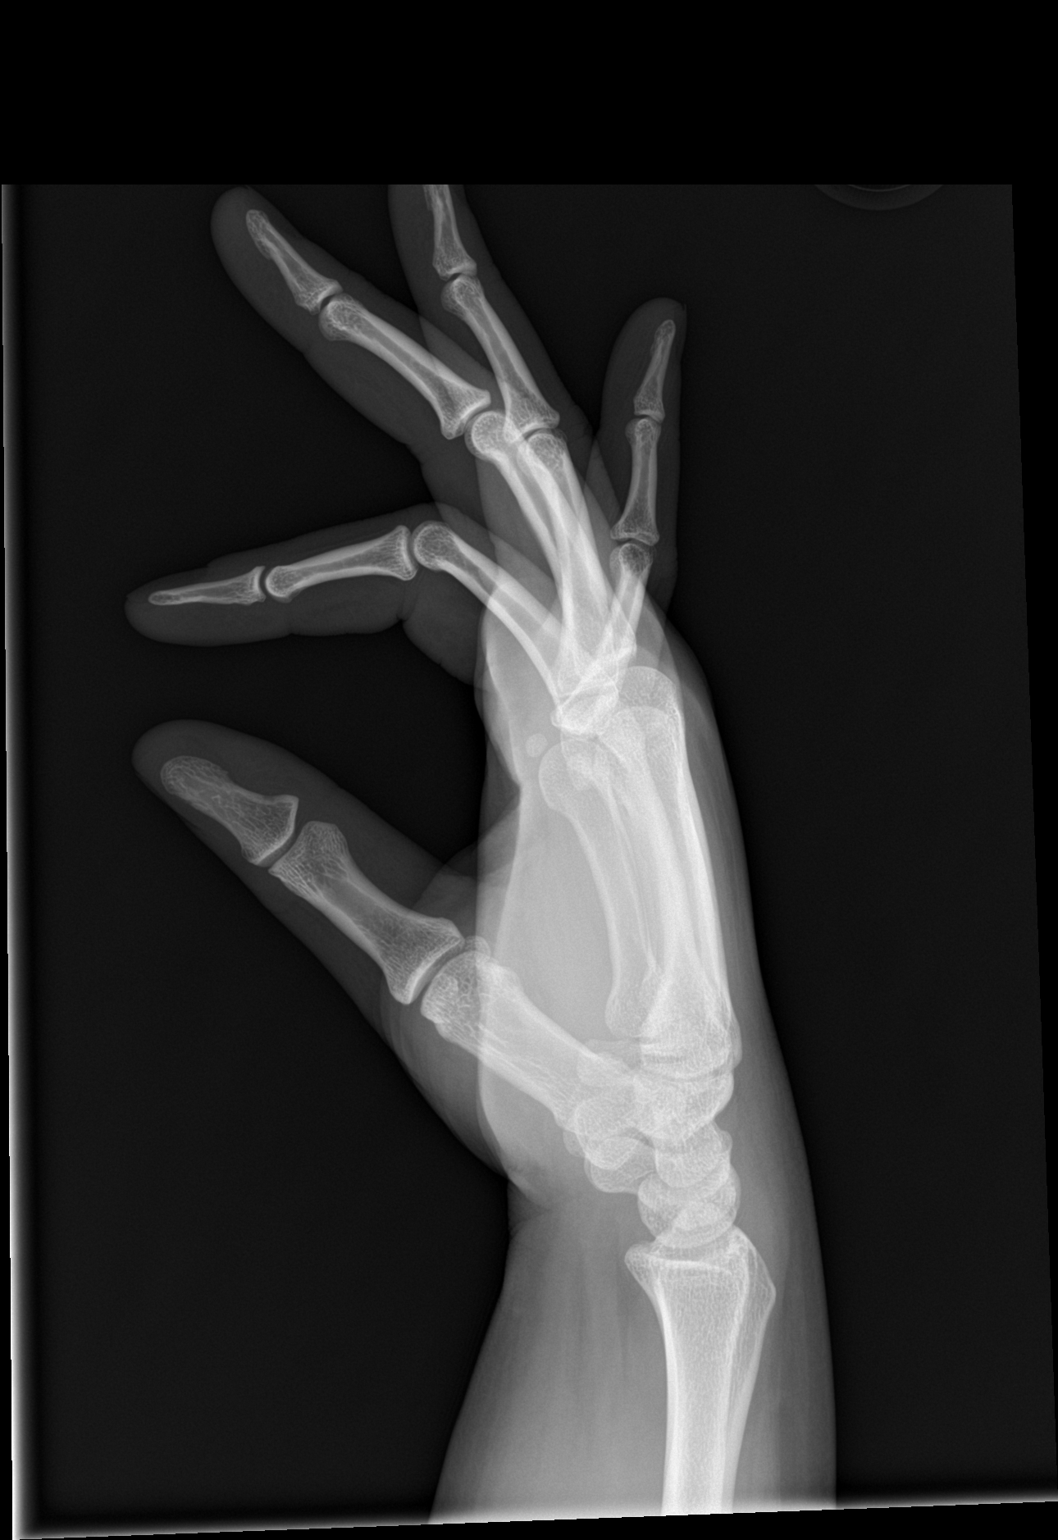

[3 of 3 positions shown; findings below may reference images not displayed]

FINDINGS: There is no evidence of fracture or dislocation. There is no
evidence of arthropathy or other focal bone abnormality. Soft
tissues are unremarkable.
IMPRESSION: Negative.

## 2021-02-07 ENCOUNTER — Ambulatory Visit: Payer: BLUE CROSS/BLUE SHIELD

## 2021-02-09 ENCOUNTER — Ambulatory Visit: Payer: BLUE CROSS/BLUE SHIELD

## 2021-02-15 ENCOUNTER — Other Ambulatory Visit: Payer: Self-pay

## 2021-02-15 ENCOUNTER — Encounter: Payer: Self-pay | Admitting: Obstetrics and Gynecology

## 2021-02-15 ENCOUNTER — Ambulatory Visit (INDEPENDENT_AMBULATORY_CARE_PROVIDER_SITE_OTHER): Payer: Medicaid Other | Admitting: Obstetrics and Gynecology

## 2021-02-15 VITALS — BP 104/72 | HR 114 | Ht 62.0 in | Wt 285.3 lb

## 2021-02-15 DIAGNOSIS — Z309 Encounter for contraceptive management, unspecified: Secondary | ICD-10-CM | POA: Diagnosis not present

## 2021-02-15 DIAGNOSIS — Z6841 Body Mass Index (BMI) 40.0 and over, adult: Secondary | ICD-10-CM | POA: Diagnosis not present

## 2021-02-15 LAB — POCT URINE PREGNANCY: Preg Test, Ur: NEGATIVE

## 2021-02-15 MED ORDER — MEDROXYPROGESTERONE ACETATE 150 MG/ML IM SUSP: 150.0000 mg | INTRAMUSCULAR | 3 refills | Status: DC

## 2021-02-15 MED ORDER — MEDROXYPROGESTERONE ACETATE 150 MG/ML IM SUSP
150.0000 mg | Freq: Once | INTRAMUSCULAR | Status: AC
  Administered 2021-02-15: 150 mg via INTRAMUSCULAR

## 2021-02-15 NOTE — Patient Instructions (Signed)
Medroxyprogesterone injection [Contraceptive] What is this medicine? MEDROXYPROGESTERONE (me DROX ee proe JES te rone) contraceptive injections prevent pregnancy. They provide effective birth control for 3 months. Depo-SubQ Provera 104 injection is also used for treating pain related to endometriosis. This medicine may be used for other purposes; ask your health care provider or pharmacist if you have questions. COMMON BRAND NAME(S): Depo-Provera, Depo-subQ Provera 104 What should I tell my health care provider before I take this medicine? They need to know if you have any of these conditions:  asthma  blood clots  breast cancer or family history of breast cancer  depression  diabetes  eating disorder (anorexia nervosa)  heart attack  high blood pressure  HIV infection or AIDS  if you often drink alcohol  kidney disease  liver disease  migraine headaches  osteoporosis, weak bones  seizures  stroke  tobacco smoker  vaginal bleeding  an unusual or allergic reaction to medroxyprogesterone, other hormones, medicines, foods, dyes, or preservatives  pregnant or trying to get pregnant  breast-feeding How should I use this medicine? Depo-Provera CI contraceptive injection is given into a muscle. Depo-subQ Provera 104 injection is given under the skin. It is given by a health care provider in a hospital or clinic setting. The injection is usually given during the first 5 days after the start of a menstrual period or 6 weeks after delivery of a baby. A patient package insert for the product will be given with each prescription and refill. Be sure to read this information carefully each time. The sheet may change often. Talk to your pediatrician regarding the use of this medicine in children. Special care may be needed. These injections have been used in female children who have started having menstrual periods. Overdosage: If you think you have taken too much of this  medicine contact a poison control center or emergency room at once. NOTE: This medicine is only for you. Do not share this medicine with others. What if I miss a dose? Keep appointments for follow-up doses. You must get an injection once every 3 months. It is important not to miss your dose. Call your health care provider if you are unable to keep an appointment. What may interact with this medicine?  antibiotics or medicines for infections, especially rifampin and griseofulvin  antivirals for HIV or hepatitis  aprepitant  armodafinil  bexarotene  bosentan  medicines for seizures like carbamazepine, felbamate, oxcarbazepine, phenytoin, phenobarbital, primidone, topiramate  mitotane  modafinil  St. John's wort This list may not describe all possible interactions. Give your health care provider a list of all the medicines, herbs, non-prescription drugs, or dietary supplements you use. Also tell them if you smoke, drink alcohol, or use illegal drugs. Some items may interact with your medicine. What should I watch for while using this medicine? This drug does not protect you against HIV infection (AIDS) or other sexually transmitted diseases. Use of this product may cause you to lose calcium from your bones. Loss of calcium may cause weak bones (osteoporosis). Only use this product for more than 2 years if other forms of birth control are not right for you. The longer you use this product for birth control the more likely you will be at risk for weak bones. Ask your health care professional how you can keep strong bones. You may have a change in bleeding pattern or irregular periods. Many females stop having periods while taking this drug. If you have received your injections on time, your   chance of being pregnant is very low. If you think you may be pregnant, see your health care professional as soon as possible. Tell your health care professional if you want to get pregnant within the  next year. The effect of this medicine may last a long time after you get your last injection. What side effects may I notice from receiving this medicine? Side effects that you should report to your doctor or health care professional as soon as possible:  allergic reactions like skin rash, itching or hives, swelling of the face, lips, or tongue  blood clot (chest pain; shortness of breath; pain, swelling, or warmth in the leg)  breast tenderness or discharge  changes in emotions or moods  changes in vision  liver injury (dark yellow or brown urine; general ill feeling or flu-like symptoms; loss of appetite, right upper belly pain; unusually weak or tired, yellowing of the eyes or skin)  persistent pain, pus, or bleeding at the injection site  stroke (changes in vision; confusion; trouble speaking or understanding; severe headaches; sudden numbness or weakness of the face, arm or leg; trouble walking; dizziness; loss of balance or coordination)  trouble breathing Side effects that usually do not require medical attention (report to your doctor or health care professional if they continue or are bothersome):  change in sex drive  dizziness  fluid retention  headache  irregular periods, spotting, or absent periods  pain, redness, or irritation at site where injected  stomach pain  weight gain This list may not describe all possible side effects. Call your doctor for medical advice about side effects. You may report side effects to FDA at 1-800-FDA-1088. Where should I keep my medicine? This injection is only given by a health care provider. It will not be stored at home. NOTE: This sheet is a summary. It may not cover all possible information. If you have questions about this medicine, talk to your doctor, pharmacist, or health care provider.  2021 Elsevier/Gold Standard (2020-01-27 10:29:21)  

## 2021-02-15 NOTE — Progress Notes (Signed)
Pt would like to discuss birth control options. Pt's LMP 02/09/2021. UPT negative. Depo injection provided by office.

## 2021-02-15 NOTE — Progress Notes (Signed)
   GYNECOLOGY CLINIC PROGRESS NOTE Subjective:    Dominique Sanders is a 25 y.o. G8P1011 female who presents for contraception counseling. Currently using withdrawal method. The patient has no complaints today. The patient is sexually active. Pertinent past medical history: morbid obesity  Menstrual History: OB History    Gravida  1   Para      Term      Preterm      AB      Living        SAB      IAB      Ectopic      Multiple      Live Births              Menarche age: 21 Patient's last menstrual period was 02/09/2021. Period Duration (Days): 4-5 Period Pattern: Regular Menstrual Flow: Heavy Menstrual Control: (S) Other (Comment) (period underwear) Menstrual Control Change Freq (Hours): 3-4 Dysmenorrhea: None  The following portions of the patient's history were reviewed and updated as appropriate: allergies, current medications, past family history, past medical history, past social history, past surgical history and problem list.  Review of Systems Pertinent items noted in HPI and remainder of comprehensive ROS otherwise negative.   Objective:    BP 104/72   Pulse (!) 114   Ht 5\' 2"  (1.575 m)   Wt 285 lb 4.8 oz (129.4 kg)   LMP 02/09/2021   BMI 52.18 kg/m     General appearance: alert and no distress Lungs: clear to auscultation bilaterally Heart: regular rate and rhythm, S1, S2 normal, no murmur, click, rub or gallop Pelvic: deferred   Assessment:    25 y.o., starting Depo-Provera injections, no contraindications.  Morbid obesity  Plan:   - Reviewed all forms of birth control options available including abstinence; fertility period awareness methods; over the counter/barrier methods; hormonal contraceptive medication including pill, patch, ring, injection,contraceptive implant; hormonal and nonhormonal IUDs; permanent sterilization options including vasectomy and the various tubal sterilization modalities were not discussed. Risks and benefits  reviewed.  Questions were answered.  UPT negative. Given initial dose today.  - Discussed obesity and risk of chance of decrease in effectiveness. Patient reports that she is planning on beginning an exercise regimen to begin weight loss.   - RTC in 3 months for next injection and annual exam.

## 2021-04-18 ENCOUNTER — Telehealth: Payer: Medicaid Other | Admitting: Emergency Medicine

## 2021-04-18 ENCOUNTER — Encounter: Payer: Self-pay | Admitting: Emergency Medicine

## 2021-04-18 DIAGNOSIS — M7918 Myalgia, other site: Secondary | ICD-10-CM

## 2021-04-18 NOTE — Progress Notes (Signed)
Dominique Sanders for a virtual visit with your provider today.    Just as we do with appointments in the office, we must obtain your consent to participate.  Your consent will be active for this visit and any virtual visit you may have with one of our providers in the next 365 days.    If you have a MyChart account, I can also send a copy of this consent to you electronically.  All virtual visits are billed to your insurance company just like a traditional visit in the office.  As this is a virtual visit, video technology does not allow for your provider to perform a traditional examination.  This may limit your provider's ability to fully assess your condition.  If your provider identifies any concerns that need to be evaluated in person or the need to arrange testing such as labs, EKG, etc, we will make arrangements to do so.    Although advances in technology are sophisticated, we cannot ensure that it will always work on either your end or our end.  If the connection with a video visit is poor, we may have to switch to a telephone visit.  With either a video or telephone visit, we are not always able to ensure that we have a secure connection.   I need to obtain your verbal consent now.   Are you willing to proceed with your visit today?   Dominique Sanders has provided verbal consent on 04/18/2021 for a virtual visit (video or telephone).   Lurene Shadow, PA-C 04/18/2021  10:37 AM   Date:  04/18/2021   ID:  Dominique Sanders, DOB 1996-02-15, MRN [id number]  Patient Location: Home Provider Location: Home Office   Participants: Patient and Provider for Visit and Wrap up  Method of visit: Video  Location of Patient: Home Location of Provider: Home Office Consent was obtain for visit over the video. Services rendered by provider: Visit was performed via video  A video enabled telemedicine application was used and I verified that I am speaking with the correct person using two  identifiers.  PCP:  Patient, No Pcp Per (Inactive)   Chief Complaint:  Buttock pain  History of Present Illness:    Dominique Sanders is a 25 y.o. female with history as stated below. Presents video telehealth for an acute care visit  Onset of symptoms was 1-2 weeks and symptoms have been persistent and include: gradually worsening pain midline buttock, worse with certain movements and sitting.  Hx of similar low back pain in the past but this is more persistent and severe. She states her boyfriend had to help get her dressed today.  Denies fever, chills, n/v/d. She has not been able to feel any swelling or bumps on the skin where pain is.  Increased pain with BMs but no urinary symptoms. Denies weakness or numbness in groin or legs.  No loss of control of bowel or bladder.   Modifying factors include: ice, Aleve and Bayer aspirin without relief.   No other aggravating or relieving factors.  No other c/o.  Past Medical, Surgical, Social History, Allergies, and Medications have been Reviewed.  Patient Active Problem List   Diagnosis Date Noted  . History of gestational diabetes 10/15/2019  . BMI 50.0-59.9, adult (HCC) 08/11/2019    Social History   Tobacco Use  . Smoking status: Never Smoker  . Smokeless tobacco: Never Used  Substance Use Topics  . Alcohol use: Not Currently  Current Outpatient Medications:  .  medroxyPROGESTERone (DEPO-PROVERA) 150 MG/ML injection, Inject 1 mL (150 mg total) into the muscle every 3 (three) months., Disp: 1 mL, Rfl: 3   No Known Allergies   ROS See HPI for history of present illness.  Physical Exam Constitutional:      General: She is not in acute distress.    Appearance: Normal appearance. She is obese. She is not ill-appearing, toxic-appearing or diaphoretic.  HENT:     Head: Normocephalic.  Pulmonary:     Effort: Pulmonary effort is normal. No respiratory distress.  Neurological:     Mental Status: She is alert.  Psychiatric:         Mood and Affect: Mood normal.        Behavior: Behavior normal.               A&P  1. Buttock pain  -Worsening over the last 1-2 weeks  -Worse with sitting  -Pain described as midline without palpable or visible skin changes per pt.  -No red flag symptoms at this time  -Concern for pilonidal cyst  -Avised in-person visit for further evaluation and treatment of symptoms    Patient voiced understanding and agreement to plan.   Time:   Today, I have spent 15 minutes with the patient with telehealth technology discussing the above problems, reviewing the chart, previous notes, medications and orders.    Tests Ordered: No orders of the defined types were placed in this encounter.   Medication Changes: No orders of the defined types were placed in this encounter.    Disposition:  Follow up PCP today if possible, otherwise encouraged to go to urgent care or hospital.   Signed, Lurene Shadow, PA-C  04/18/2021 10:37 AM

## 2021-04-18 NOTE — Patient Instructions (Addendum)
  Please try to be seen in person by your primary care provider today as discussed. If you are unable to get into your primary care provider today or tomorrow, do not hesitate to go to an urgent care or hospital for further evaluation and treatment, especially if symptoms continue to worsen- worsening pain, fever, trouble going to the bathroom, trouble walking.

## 2021-05-03 ENCOUNTER — Encounter: Payer: Medicaid Other | Admitting: Obstetrics and Gynecology

## 2021-05-03 ENCOUNTER — Encounter: Payer: Self-pay | Admitting: Obstetrics and Gynecology

## 2021-05-03 ENCOUNTER — Ambulatory Visit: Payer: Medicaid Other

## 2021-05-08 ENCOUNTER — Ambulatory Visit: Payer: Medicaid Other

## 2021-06-06 ENCOUNTER — Encounter: Payer: Self-pay | Admitting: Obstetrics and Gynecology

## 2021-06-06 ENCOUNTER — Other Ambulatory Visit: Payer: Self-pay

## 2021-06-06 ENCOUNTER — Ambulatory Visit (INDEPENDENT_AMBULATORY_CARE_PROVIDER_SITE_OTHER): Payer: Medicaid Other | Admitting: Obstetrics and Gynecology

## 2021-06-06 VITALS — BP 108/74 | HR 103 | Ht 62.0 in | Wt 298.1 lb

## 2021-06-06 DIAGNOSIS — M546 Pain in thoracic spine: Secondary | ICD-10-CM | POA: Diagnosis not present

## 2021-06-06 DIAGNOSIS — R635 Abnormal weight gain: Secondary | ICD-10-CM

## 2021-06-06 DIAGNOSIS — G8929 Other chronic pain: Secondary | ICD-10-CM

## 2021-06-06 DIAGNOSIS — N939 Abnormal uterine and vaginal bleeding, unspecified: Secondary | ICD-10-CM

## 2021-06-06 MED ORDER — IBUPROFEN 800 MG PO TABS: 800.0000 mg | ORAL_TABLET | Freq: Three times a day (TID) | ORAL | 1 refills | Status: DC | PRN

## 2021-06-06 NOTE — Patient Instructions (Signed)
Back Exercises The following exercises strengthen the muscles that help to support the trunk and back. They also help to keep the lower back flexible. Doing these exercises can help to prevent back pain or lessen existing pain. If you have back pain or discomfort, try doing these exercises 2-3 times each day or as told by your health care provider. As your pain improves, do them once each day, but increase the number of times that you repeat the steps for each exercise (do more repetitions). To prevent the recurrence of back pain, continue to do these exercises once each day or as told by your health care provider. Do exercises exactly as told by your health care provider and adjust them as directed. It is normal to feel mild stretching, pulling, tightness, or discomfort as you do these exercises, but you should stop right away if youfeel sudden pain or your pain gets worse. Exercises Single knee to chest Repeat these steps 3-5 times for each leg: Lie on your back on a firm bed or the floor with your legs extended. Bring one knee to your chest. Your other leg should stay extended and in contact with the floor. Hold your knee in place by grabbing your knee or thigh with both hands and hold. Pull on your knee until you feel a gentle stretch in your lower back or buttocks. Hold the stretch for 10-30 seconds. Slowly release and straighten your leg. Pelvic tilt Repeat these steps 5-10 times: Lie on your back on a firm bed or the floor with your legs extended. Bend your knees so they are pointing toward the ceiling and your feet are flat on the floor. Tighten your lower abdominal muscles to press your lower back against the floor. This motion will tilt your pelvis so your tailbone points up toward the ceiling instead of pointing to your feet or the floor. With gentle tension and even breathing, hold this position for 5-10 seconds. Cat-cow Repeat these steps until your lower back becomes more  flexible: Get into a hands-and-knees position on a firm surface. Keep your hands under your shoulders, and keep your knees under your hips. You may place padding under your knees for comfort. Let your head hang down toward your chest. Contract your abdominal muscles and point your tailbone toward the floor so your lower back becomes rounded like the back of a cat. Hold this position for 5 seconds. Slowly lift your head, let your abdominal muscles relax and point your tailbone up toward the ceiling so your back forms a sagging arch like the back of a cow. Hold this position for 5 seconds.  Press-ups Repeat these steps 5-10 times: Lie on your abdomen (face-down) on the floor. Place your palms near your head, about shoulder-width apart. Keeping your back as relaxed as possible and keeping your hips on the floor, slowly straighten your arms to raise the top half of your body and lift your shoulders. Do not use your back muscles to raise your upper torso. You may adjust the placement of your hands to make yourself more comfortable. Hold this position for 5 seconds while you keep your back relaxed. Slowly return to lying flat on the floor.  Bridges Repeat these steps 10 times: Lie on your back on a firm surface. Bend your knees so they are pointing toward the ceiling and your feet are flat on the floor. Your arms should be flat at your sides, next to your body. Tighten your buttocks muscles and lift your   buttocks off the floor until your waist is at almost the same height as your knees. You should feel the muscles working in your buttocks and the back of your thighs. If you do not feel these muscles, slide your feet 1-2 inches farther away from your buttocks. Hold this position for 3-5 seconds. Slowly lower your hips to the starting position, and allow your buttocks muscles to relax completely. If this exercise is too easy, try doing it with your arms crossed over yourchest. Abdominal  crunches Repeat these steps 5-10 times: Lie on your back on a firm bed or the floor with your legs extended. Bend your knees so they are pointing toward the ceiling and your feet are flat on the floor. Cross your arms over your chest. Tip your chin slightly toward your chest without bending your neck. Tighten your abdominal muscles and slowly raise your trunk (torso) high enough to lift your shoulder blades a tiny bit off the floor. Avoid raising your torso higher than that because it can put too much stress on your low back and does not help to strengthen your abdominal muscles. Slowly return to your starting position. Back lifts Repeat these steps 5-10 times: Lie on your abdomen (face-down) with your arms at your sides, and rest your forehead on the floor. Tighten the muscles in your legs and your buttocks. Slowly lift your chest off the floor while you keep your hips pressed to the floor. Keep the back of your head in line with the curve in your back. Your eyes should be looking at the floor. Hold this position for 3-5 seconds. Slowly return to your starting position. Contact a health care provider if: Your back pain or discomfort gets much worse when you do an exercise. Your worsening back pain or discomfort does not lessen within 2 hours after you exercise. If you have any of these problems, stop doing these exercises right away. Do not do them again unless your health care provider says that you can. Get help right away if: You develop sudden, severe back pain. If this happens, stop doing the exercises right away. Do not do them again unless your health care provider says that you can. This information is not intended to replace advice given to you by your health care provider. Make sure you discuss any questions you have with your healthcare provider. Document Revised: 04/16/2019 Document Reviewed: 09/11/2018 [employer] Patient Education  2022 [employer]  

## 2021-06-06 NOTE — Progress Notes (Signed)
Pt present for prolonged menses. Pt reported cramping, back pain, unable to walk and prolonged cycles.

## 2021-06-06 NOTE — Progress Notes (Signed)
    GYNECOLOGY PROGRESS NOTE  Subjective:    Patient ID: Dominique Sanders, female    DOB: 1996-06-23, 25 y.o.   MRN: [id number]  HPI  Patient is a 25 y.o. G1P0 female who presents for several complaints today:   Does not desire to continue Depo (has gained 20 lbs so far). Last injection was due in May but did not show for appointment. Cycle has been prolonged (lasted ~ 3-4 weeks), recently stopped.   Also complaining of back pain, starts in lower back and rides upward.  Feels like a locking motion, also noting sharp pains in lower back. Can occur with sitting or standing activities. Has bene ongoing since birth of her last child. Used to be short infrequent bouts of pain occasionally (usually several minutes), now lasting for several hours.  Takes Bayer aspirin which does not really help.    The following portions of the patient's history were reviewed and updated as appropriate: allergies, current medications, past family history, past medical history, past social history, past surgical history, and problem list.  Review of Systems Pertinent items noted in HPI and remainder of comprehensive ROS otherwise negative.   Objective:   Blood pressure 108/74, pulse (!) 103, height 5\' 2"  (1.575 m), weight 298 lb 1.6 oz (135.2 kg), unknown if currently breastfeeding. General appearance: alert and no distress Abdomen: soft, non-tender; bowel sounds normal; no masses,  no organomegaly Pelvic: declines Extremities: extremities normal, atraumatic, no cyanosis or edema Back: symmetric, no curvature. ROM normal. No CVA tenderness., tenderness to palpation in mid-thoracic region.   Neurologic: Grossly normal   Assessment:   1. Weight gain due to medication   2. Abnormal uterine bleeding (AUB)   3. Chronic midline thoracic back pain     Plan:   Patient no longer desires to continue Depo Provera due to weight gain. Has missed last injection. Plans on abstinence for contraception. Discussed healthy  lifestyle interventions for weight.  AUB, likely secondary to discontinuation of Depo Provera. Recently stopped bleeding. Advised that if bleeding continues to be abnormal or irregular, to f/u for further management.  Thoracic back pain for over a year. Discussed back exercises, warm compresses, prescribed NSAIDs.  If still no improvement, can consider chiropractor or physical therapy.    , MD [facility]

## 2021-07-07 NOTE — Patient Instructions (Signed)
Breast Self-Awareness Breast self-awareness is knowing how your breasts look and feel. Doing breast self-awareness is important. It allows you to catch a breast problem early while it is still small and can be treated. All women should do breast self-awareness, including women who have had breast implants. Tell your doctorif you notice a change in your breasts. What you need: A mirror. A well-lit room. How to do a breast self-exam A breast self-exam is one way to learn what is normal for your breasts and tocheck for changes. To do a breast self-exam: Look for changes  Take off all the clothes above your waist. Stand in front of a mirror in a room with good lighting. Put your hands on your hips. Push your hands down. Look at your breasts and nipples in the mirror to see if one breast or nipple looks different from the other. Check to see if: The shape of one breast is different. The size of one breast is different. There are wrinkles, dips, and bumps in one breast and not the other. Look at each breast for changes in the skin, such as: Redness. Scaly areas. Look for changes in your nipples, such as: Liquid around the nipples. Bleeding. Dimpling. Redness. A change in where the nipples are.  Feel for changes  Lie on your back on the floor. Feel each breast. To do this, follow these steps: Pick a breast to feel. Put the arm closest to that breast above your head. Use your other arm to feel the nipple area of your breast. Feel the area with the pads of your three middle fingers by making small circles with your fingers. For the first circle, press lightly. For the second circle, press harder. For the third circle, press even harder. Keep making circles with your fingers at the different pressures as you move down your breast. Stop when you feel your ribs. Move your fingers a little toward the center of your body. Start making circles with your fingers again, this time going up until  you reach your collarbone. Keep making up-and-down circles until you reach your armpit. Remember to keep using the three pressures. Feel the other breast in the same way. Sit or stand in the tub or shower. With soapy water on your skin, feel each breast the same way you did in step 2 when you were lying on the floor.  Write down what you find Writing down what you find can help you remember what to tell your doctor. Write down: What is normal for each breast. Any changes you find in each breast, including: The kind of changes you find. Whether you have pain. Size and location of any lumps. When you last had your menstrual period. General tips Check your breasts every month. If you are breastfeeding, the best time to check your breasts is after you feed your baby or after you use a breast pump. If you get menstrual periods, the best time to check your breasts is 5-7 days after your menstrual period is over. With time, you will become comfortable with the self-exam, and you will begin to know if there are changes in your breasts. Contact a doctor if you: See a change in the shape or size of your breasts or nipples. See a change in the skin of your breast or nipples, such as red or scaly skin. Have fluid coming from your nipples that is not normal. Find a lump or thick area that was not there before. Have pain in   your breasts. Have any concerns about your breast health. Summary Breast self-awareness includes looking for changes in your breasts, as well as feeling for changes within your breasts. Breast self-awareness should be done in front of a mirror in a well-lit room. You should check your breasts every month. If you get menstrual periods, the best time to check your breasts is 5-7 days after your menstrual period is over. Let your doctor know of any changes you see in your breasts, including changes in size, changes on the skin, pain or tenderness, or fluid from your nipples that is  not normal. This information is not intended to replace advice given to you by your health care provider. Make sure you discuss any questions you have with your healthcare provider. Document Revised: 07/29/2018 Document Reviewed: 07/29/2018 Elsevier Patient Education  2022 Elsevier Inc. Preventive Care 21-39 Years Old, Female Preventive care refers to lifestyle choices and visits with your health care provider that can promote health and wellness. This includes: A yearly physical exam. This is also called an annual wellness visit. Regular dental and eye exams. Immunizations. Screening for certain conditions. Healthy lifestyle choices, such as: Eating a healthy diet. Getting regular exercise. Not using drugs or products that contain nicotine and tobacco. Limiting alcohol use. What can I expect for my preventive care visit? Physical exam Your health care provider may check your: Height and weight. These may be used to calculate your BMI (body mass index). BMI is a measurement that tells if you are at a healthy weight. Heart rate and blood pressure. Body temperature. Skin for abnormal spots. Counseling Your health care provider may ask you questions about your: Past medical problems. Family's medical history. Alcohol, tobacco, and drug use. Emotional well-being. Home life and relationship well-being. Sexual activity. Diet, exercise, and sleep habits. Work and work environment. Access to firearms. Method of birth control. Menstrual cycle. Pregnancy history. What immunizations do I need?  Vaccines are usually given at various ages, according to a schedule. Your health care provider will recommend vaccines for you based on your age, medicalhistory, and lifestyle or other factors, such as travel or where you work. What tests do I need?  Blood tests Lipid and cholesterol levels. These may be checked every 5 years starting at age 20. Hepatitis C test. Hepatitis B  test. Screening Diabetes screening. This is done by checking your blood sugar (glucose) after you have not eaten for a while (fasting). STD (sexually transmitted disease) testing, if you are at risk. BRCA-related cancer screening. This may be done if you have a family history of breast, ovarian, tubal, or peritoneal cancers. Pelvic exam and Pap test. This may be done every 3 years starting at age 21. Starting at age 30, this may be done every 5 years if you have a Pap test in combination with an HPV test. Talk with your health care provider about your test results, treatment options,and if necessary, the need for more tests. Follow these instructions at home: Eating and drinking  Eat a healthy diet that includes fresh fruits and vegetables, whole grains, lean protein, and low-fat dairy products. Take vitamin and mineral supplements as recommended by your health care provider. Do not drink alcohol if: Your health care provider tells you not to drink. You are pregnant, may be pregnant, or are planning to become pregnant. If you drink alcohol: Limit how much you have to 0-1 drink a day. Be aware of how much alcohol is in your drink. In the [place] one   drink equals one 12 oz bottle of beer (355 mL), one 5 oz glass of wine (148 mL), or one 1 oz glass of hard liquor (44 mL).  Lifestyle Take daily care of your teeth and gums. Brush your teeth every morning and night with fluoride toothpaste. Floss one time each day. Stay active. Exercise for at least 30 minutes 5 or more days each week. Do not use any products that contain nicotine or tobacco, such as cigarettes, e-cigarettes, and chewing tobacco. If you need help quitting, ask your health care provider. Do not use drugs. If you are sexually active, practice safe sex. Use a condom or other form of protection to prevent STIs (sexually transmitted infections). If you do not wish to become pregnant, use a form of birth control. If you plan to become  pregnant, see your health care provider for a prepregnancy visit. Find healthy ways to cope with stress, such as: Meditation, yoga, or listening to music. Journaling. Talking to a trusted person. Spending time with friends and family. Safety Always wear your seat belt while driving or riding in a vehicle. Do not drive: If you have been drinking alcohol. Do not ride with someone who has been drinking. When you are tired or distracted. While texting. Wear a helmet and other protective equipment during sports activities. If you have firearms in your house, make sure you follow all gun safety procedures. Seek help if you have been physically or sexually abused. What's next? Go to your health care provider once a year for an annual wellness visit. Ask your health care provider how often you should have your eyes and teeth checked. Stay up to date on all vaccines. This information is not intended to replace advice given to you by your health care provider. Make sure you discuss any questions you have with your healthcare provider. Document Revised: 08/07/2020 Document Reviewed: 08/21/2018 Elsevier Patient Education  2022 Elsevier Inc.  

## 2021-07-11 ENCOUNTER — Encounter: Payer: Self-pay | Admitting: Obstetrics and Gynecology

## 2021-07-11 ENCOUNTER — Encounter: Payer: Medicaid Other | Admitting: Obstetrics and Gynecology

## 2021-07-11 NOTE — Progress Notes (Signed)
Encounter created in error

## 2021-08-21 NOTE — Progress Notes (Signed)
GYNECOLOGY PROGRESS NOTE Subjective:     Dominique Sanders is a 25 y.o. female here for discussion regarding weight loss. The weight she is at is the largest she has ever been. She was 197 lbs when she gave birth one year ago. Currently her weight is at 300 lbs. Her goal weight right now is 275 (notes that she does not want to get her hopes up).  She has begun noting leg pain after standing for short periods of time.  History of eating disorders: none.  Does notes that she thinks that some of her weight has to due with some depression that she was dealing with in regards to the relationship with her child's father.  There is a family history positive for obesity in the patient and mother. Previous treatments for obesity include  None . Obesity associated medical conditions: none. Obesity associated medications: none currently, however was on Depo Provera for contraception for ~ 6 months and noted weight gain from this as well.  Cardiovascular risk factors besides obesity: none. Reports that a friend told her about weight loss medications called Phentermine and Bumetanide  that helped her lose weight. Has questions about these.   Current interventions:  1. Diet - She does not drink sugary drinks, has increased her water intake, small amounts of juice. She has cut down on sugary foods. She has been working to limit her portion sizes. Consuming more vegetables and limiting meats.  2. Activity - She walks every other day for about 15 minutes. She works out to exercise videos twice a week.  3. Reports bowel movements are abnormal, she does not have a bowel movement all the time.   The following portions of the patient's history were reviewed and updated as appropriate: allergies, current medications, past family history, past medical history, past social history, past surgical history, and problem list.  Past Medical History:  Diagnosis Date   Preeclampsia 01/08/2020   Results for orders placed or performed  during the hospital encounter of 01/07/20 (from the past 24 hour(s)) Urinalysis, Routine w reflex microscopic     Status: Abnormal  Collection Time: 01/07/20 11:20 PM Result Value Ref Range  Color, Urine STRAW (A) YELLOW  APPearance CLEAR CLEAR  Specific Gravity, Urine 1.009 1.005 - 1.030  pH 7.0 5.0 - 8.0  Glucose, UA >=500 (A) NEGATIVE mg/dL  Hgb urine dips    Family History  Problem Relation Age of Onset   Healthy Mother    Healthy Father     Past Surgical History:  Procedure Laterality Date   no surgical       Current Outpatient Medications on File Prior to Visit  Medication Sig Dispense Refill   ibuprofen (ADVIL) 800 MG tablet Take 1 tablet (800 mg total) by mouth every 8 (eight) hours as needed. 60 tablet 1   No current facility-administered medications on file prior to visit.    No Known Allergies  Review of Systems Pertinent items noted in HPI and remainder of comprehensive ROS otherwise negative.    Objective:    Body mass index is 54.89 kg/m. BP 122/80   Pulse 83   Resp 16   Ht 5\' 2"  (1.575 m)   Wt (!) 300 lb 1.6 oz (136.1 kg)   LMP 07/23/2021 (Exact Date)   BMI 54.89 kg/m  General appearance: alert and no distress Lungs: clear to auscultation bilaterally Heart: regular rate and rhythm, S1, S2 normal, no murmur, click, rub or gallop Abdomen: soft, non-tender; bowel  sounds normal; no masses,  no organomegaly.  Waist circumference 50 inches.  Neurologic: Grossly normal    Assessment:    Obesity, morbid (BMI 54). I assessed Miyeko to be in an action stage with respect to weight loss.    Plan:   - General weight loss/lifestyle modification strategies discussed (elicit support from others; identify saboteurs; non-food rewards, etc). - Diet interventions: low calorie (1000 kCal/d) deficit diet, qualitative changes (increase low-fat,  high-fiber foods), and referral to dietitian for guidance in these changes. - Informal exercise measures discussed, e.g. taking  stairs instead of elevator. - Regular aerobic exercise program discussed. - Medication: phentermine and Topamax (patient does endorse cravings). The risks and benefits and side effects of medication, such as Adipex (Phenteramine),  Belviq (lorcarsin), Contrave (buproprion/naltrexone), Qsymia (phentermine/topiramate), and Saxenda (liraglutide) were discussed. The pros and cons of suppressing appetite and boosting metabolism is discussed. Risks of tolerence and addiction is discussed for selected agents discussed. Use of medicine will ne short term, such as 3-4 months at a time followed by a period of time off of the medicine to avoid these risks and side effects for Adipex discussed. Pt to call with any negative side effects and agrees to keep follow up appts. - Will order baseline labs to assess for comorbidities (see orders). Patient did have a history of pre-eclampsia and gestational DM in her last pregnancy.    A total of 25 minutes were spent face-to-face with the patient during this encounter and over half of that time involved counseling and coordination of care.    Hildred Laser, MD [facility]

## 2021-08-22 ENCOUNTER — Ambulatory Visit: Payer: Medicaid Other | Admitting: Obstetrics and Gynecology

## 2021-08-22 ENCOUNTER — Encounter: Payer: Self-pay | Admitting: Obstetrics and Gynecology

## 2021-08-22 ENCOUNTER — Other Ambulatory Visit: Payer: Self-pay

## 2021-08-22 VITALS — BP 122/80 | HR 83 | Resp 16 | Ht 62.0 in | Wt 300.1 lb

## 2021-08-22 DIAGNOSIS — Z713 Dietary counseling and surveillance: Secondary | ICD-10-CM | POA: Diagnosis not present

## 2021-08-22 DIAGNOSIS — Z6841 Body Mass Index (BMI) 40.0 and over, adult: Secondary | ICD-10-CM | POA: Diagnosis not present

## 2021-08-22 MED ORDER — TOPIRAMATE 50 MG PO TABS: 50.0000 mg | ORAL_TABLET | Freq: Two times a day (BID) | ORAL | 6 refills | Status: DC

## 2021-08-22 MED ORDER — PHENTERMINE HCL 15 MG PO CAPS: ORAL_CAPSULE | ORAL | 0 refills | Status: DC

## 2021-08-22 NOTE — Patient Instructions (Signed)
Preventing Unhealthy Weight Gain, Adult Staying at a healthy weight is important to your overall health. When fat builds up in your body, you may become overweight or obese. Being overweight or obese increases your risk of developing certain health problems, such as heartdisease, diabetes, sleeping problems, joint problems, and some types of cancer. Unhealthy weight gain is often the result of making unhealthy food choices or not getting enough exercise. You can make changes to your lifestyle to preventobesity and stay as healthy as possible. What nutrition changes can be made?  Eat only as much as your body needs. To do this: Pay attention to signs that you are hungry or full. Stop eating as soon as you feel full. If you feel hungry, try drinking water first before eating. Drink enough water so your urine is clear or pale yellow. Eat smaller portions. Pay attention to portion sizes when eating out. Look at serving sizes on food labels. Most foods contain more than one serving per container. Eat the recommended number of calories for your gender and activity level. For most active people, a daily total of 2,000 calories is appropriate. If you are trying to lose weight or are not very active, you may need to eat fewer calories. Talk with your health care provider or a diet and nutrition specialist (dietitian) about how many calories you need each day. Choose healthy foods, such as: Fruits and vegetables. At each meal, try to fill at least half of your plate with fruits and vegetables. Whole grains, such as whole-wheat bread, brown rice, and quinoa. Lean meats, such as chicken or fish. Other healthy proteins, such as beans, eggs, or tofu. Healthy fats, such as nuts, seeds, fatty fish, and olive oil. Low-fat or fat-free dairy products. Check food labels, and avoid food and drinks that: Are high in calories. Have added sugar. Are high in sodium. Have saturated fats or trans fats. Cook foods in  healthier ways, such as by baking, broiling, or grilling. Make a meal plan for the week, and shop with a grocery list to help you stay on track with your purchases. Try to avoid going to the grocery store when you are hungry. When grocery shopping, try to shop around the outside of the store first, where the fresh foods are. Doing this helps you to avoid prepackaged foods, which can be high in sugar, salt (sodium), and fat. What lifestyle changes can be made?  Exercise for 30 or more minutes on 5 or more days each week. Exercising may include brisk walking, yard work, biking, running, swimming, and team sports like basketball and soccer. Ask your health care provider which exercises are safe for you. Do muscle-strengthening activities, such as lifting weights or using resistance bands, on 2 or more days a week. Do not use any products that contain nicotine or tobacco, such as cigarettes and e-cigarettes. If you need help quitting, ask your health care provider. Limit alcohol intake to no more than 1 drink a day for nonpregnant women and 2 drinks a day for men. One drink equals 12 oz of beer, 5 oz of wine, or 1 oz of hard liquor. Try to get 7-9 hours of sleep each night. What other changes can be made? Keep a food and activity journal to keep track of: What you ate and how many calories you had. Remember to count the calories in sauces, dressings, and side dishes. Whether you were active, and what exercises you did. Your calorie, weight, and activity goals. Check your   weight regularly. Track any changes. If you notice you have gained weight, make changes to your diet or activity routine. Avoid taking weight-loss medicines or supplements. Talk to your health care provider before starting any new medicine or supplement. Talk to your health care provider before trying any new diet or exercise plan. Why are these changes important? Eating healthy, staying active, and having healthy habits can help you  to prevent obesity. Those changes also: Help you manage stress and emotions. Help you connect with friends and family. Improve your self-esteem. Improve your sleep. Prevent long-term health problems. What can happen if changes are not made? Being obese or overweight can cause you to develop joint or bone problems, which can make it hard for you to stay active or do activities you enjoy. Being obese or overweight also puts stress on your heart and lungs and can lead tohealth problems like diabetes, heart disease, and some cancers. Where to find more information Talk with your health care provider or a dietitian about healthy eating and healthy lifestyle choices. You may also find information from: [place] Department of Agriculture, MyPlate: www.choosemyplate.gov American Heart Association: www.heart.org Centers for Disease Control and Prevention: www.cdc.gov Summary Staying at a healthy weight is important to your overall health. It helps you to prevent certain diseases and health problems, such as heart disease, diabetes, joint problems, sleep disorders, and some types of cancer. Being obese or overweight can cause you to develop joint or bone problems, which can make it hard for you to stay active or do activities you enjoy. You can prevent unhealthy weight gain by eating a healthy diet, exercising regularly, not smoking, limiting alcohol, and getting enough sleep. Talk with your health care provider or a dietitian for guidance about healthy eating and healthy lifestyle choices. This information is not intended to replace advice given to you by your health care provider. Make sure you discuss any questions you have with your healthcare provider. Document Revised: 04/07/2020 Document Reviewed: 04/07/2020 Elsevier Patient Education  2022 Elsevier Inc.  

## 2021-08-23 LAB — COMPREHENSIVE METABOLIC PANEL
ALT: 11 IU/L (ref 0–32)
AST: 15 IU/L (ref 0–40)
Albumin/Globulin Ratio: 1.6 (ref 1.2–2.2)
Albumin: 3.9 g/dL (ref 3.9–5.0)
Alkaline Phosphatase: 90 IU/L (ref 44–121)
BUN/Creatinine Ratio: 15 (ref 9–23)
BUN: 11 mg/dL (ref 6–20)
Bilirubin Total: <0.2 mg/dL (ref 0.0–1.2)
CO2: 25 mmol/L (ref 20–29)
Calcium: 9.7 mg/dL (ref 8.7–10.2)
Chloride: 101 mmol/L (ref 96–106)
Creatinine, Ser: 0.73 mg/dL (ref 0.57–1.00)
Globulin, Total: 2.4 g/dL (ref 1.5–4.5)
Glucose: 82 mg/dL (ref 65–99)
Potassium: 4.2 mmol/L (ref 3.5–5.2)
Sodium: 139 mmol/L (ref 134–144)
Total Protein: 6.3 g/dL (ref 6.0–8.5)
eGFR: 118 mL/min/{1.73_m2} (ref >59)

## 2021-08-23 LAB — LIPID PANEL
Chol/HDL Ratio: 2.6 ratio (ref 0.0–4.4)
Cholesterol, Total: 129 mg/dL (ref 100–199)
HDL: 50 mg/dL (ref >39)
LDL Chol Calc (NIH): 55 mg/dL (ref 0–99)
Triglycerides: 138 mg/dL (ref 0–149)
VLDL Cholesterol Cal: 24 mg/dL (ref 5–40)

## 2021-08-23 LAB — HEMOGLOBIN A1C
Est. average glucose Bld gHb Est-mCnc: 120 mg/dL
Hgb A1c MFr Bld: 5.8 % — ABNORMAL HIGH (ref 4.8–5.6)

## 2021-08-23 LAB — TSH: TSH: 2.57 u[IU]/mL (ref 0.450–4.500)

## 2021-08-23 LAB — VITAMIN B12: Vitamin B-12: 473 pg/mL (ref 232–1245)

## 2021-09-26 ENCOUNTER — Encounter: Payer: Medicaid Other | Admitting: Obstetrics and Gynecology

## 2021-09-28 ENCOUNTER — Telehealth: Payer: Medicaid Other | Admitting: Physician Assistant

## 2021-09-28 ENCOUNTER — Telehealth: Payer: Medicaid Other

## 2021-09-28 NOTE — Progress Notes (Signed)
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 Advised to contact whomever performed testing. Will mark erroneous.

## 2021-09-29 ENCOUNTER — Telehealth: Payer: Self-pay

## 2021-10-03 ENCOUNTER — Other Ambulatory Visit: Payer: Self-pay

## 2021-10-03 ENCOUNTER — Ambulatory Visit (LOCAL_COMMUNITY_HEALTH_CENTER): Payer: Medicaid Other

## 2021-10-03 VITALS — Wt 289.0 lb

## 2021-10-03 DIAGNOSIS — R7611 Nonspecific reaction to tuberculin skin test without active tuberculosis: Secondary | ICD-10-CM

## 2021-10-03 DIAGNOSIS — Z111 Encounter for screening for respiratory tuberculosis: Secondary | ICD-10-CM

## 2021-10-03 NOTE — Progress Notes (Signed)
PPD placed on 09/29/21 at [facility].  Patient recently started a job there;  needed PPD for employment.  Patient in clinic today. Tearful because she is scared and her left forearm still hurts from PPD placement.  13mm induration that is hard as a golf ball; scarring obvious from last weeks blistering. Reports when PPD was placed, nurse used a lot of solution and "worked for a while on my arm". Informed patient that TB RN questioned if correct amount of Tuberculin was given or correct solution used for PPD.  Patient stated that the placement last week was very painful and her arm hurt so badly for days after.   TB RN will still send for CXR.  PPD placed in Right forearm today.  Patient to [facility] on Friday 10/08/21 for PPDR. TB RN contact info given to patient in case of questions or concerns.Richmond Campbell, RN       PCP Dr. Valentino Saxon- [facility]

## 2021-10-06 ENCOUNTER — Ambulatory Visit
Admission: RE | Admit: 2021-10-06 | Discharge: 2021-10-06 | Disposition: A | Discharge status: Home / Self Care | Payer: Medicaid Other | Source: Ambulatory Visit | Attending: Family Medicine | Admitting: Family Medicine

## 2021-10-06 ENCOUNTER — Other Ambulatory Visit: Payer: Self-pay

## 2021-10-06 ENCOUNTER — Ambulatory Visit (LOCAL_COMMUNITY_HEALTH_CENTER): Payer: Self-pay

## 2021-10-06 ENCOUNTER — Ambulatory Visit
Admission: RE | Admit: 2021-10-06 | Discharge: 2021-10-06 | Disposition: A | Discharge status: Home / Self Care | Payer: Medicaid Other | Attending: Family Medicine | Admitting: Family Medicine

## 2021-10-06 ENCOUNTER — Other Ambulatory Visit: Payer: Self-pay | Admitting: Family Medicine

## 2021-10-06 DIAGNOSIS — R7611 Nonspecific reaction to tuberculin skin test without active tuberculosis: Secondary | ICD-10-CM

## 2021-10-06 DIAGNOSIS — R7612 Nonspecific reaction to cell mediated immunity measurement of gamma interferon antigen response without active tuberculosis: Secondary | ICD-10-CM

## 2021-10-06 LAB — TB SKIN TEST
Induration: 17 mm
Induration: 0 mm
TB Skin Test: POSITIVE
TB Skin Test: NEGATIVE

## 2021-10-06 NOTE — Progress Notes (Signed)
EPI completed on 10/03/21. Patient will go for CXR tomorrow when she has transportation.  Discussed with patient LTBI vs Active TB and she thinks she may want to proceed with LTBI tx.Richmond Campbell, RN

## 2021-10-24 ENCOUNTER — Encounter: Payer: Medicaid Other | Admitting: Obstetrics and Gynecology

## 2021-12-06 ENCOUNTER — Encounter: Payer: Medicaid Other | Admitting: Obstetrics and Gynecology

## 2022-01-15 NOTE — Telephone Encounter (Signed)
Patient seen 10/03/21 Richmond Campbell, RN

## 2022-02-27 ENCOUNTER — Encounter: Payer: Self-pay | Admitting: Obstetrics and Gynecology

## 2022-03-09 ENCOUNTER — Encounter: Payer: Medicaid Other | Admitting: Obstetrics and Gynecology

## 2022-06-18 ENCOUNTER — Telehealth: Payer: Medicaid Other | Admitting: Physician Assistant

## 2022-06-18 DIAGNOSIS — N930 Postcoital and contact bleeding: Secondary | ICD-10-CM | POA: Diagnosis not present

## 2022-07-23 ENCOUNTER — Ambulatory Visit: Payer: Medicaid Other

## 2022-07-30 ENCOUNTER — Ambulatory Visit (INDEPENDENT_AMBULATORY_CARE_PROVIDER_SITE_OTHER): Payer: Medicaid Other | Admitting: Obstetrics and Gynecology

## 2022-07-30 VITALS — BP 137/83 | HR 117 | Resp 16 | Ht 62.0 in | Wt 288.6 lb

## 2022-07-30 DIAGNOSIS — Z3200 Encounter for pregnancy test, result unknown: Secondary | ICD-10-CM | POA: Diagnosis not present

## 2022-07-30 DIAGNOSIS — Z3A08 8 weeks gestation of pregnancy: Secondary | ICD-10-CM

## 2022-07-30 DIAGNOSIS — Z3481 Encounter for supervision of other normal pregnancy, first trimester: Secondary | ICD-10-CM | POA: Diagnosis not present

## 2022-07-30 DIAGNOSIS — Z0189 Encounter for other specified special examinations: Secondary | ICD-10-CM

## 2022-07-30 DIAGNOSIS — Z113 Encounter for screening for infections with a predominantly sexual mode of transmission: Secondary | ICD-10-CM

## 2022-07-30 DIAGNOSIS — Z0283 Encounter for blood-alcohol and blood-drug test: Secondary | ICD-10-CM

## 2022-07-30 LAB — POCT URINE PREGNANCY: Preg Test, Ur: POSITIVE — AB

## 2022-07-30 MED ORDER — BONJESTA 20-20 MG PO TBCR: 20.0000 mg | EXTENDED_RELEASE_TABLET | Freq: Two times a day (BID) | ORAL | 0 refills | Status: DC | PRN

## 2022-07-30 MED ORDER — PRENATE MINI 18-0.6-0.4-350 MG PO CAPS: 1.0000 | ORAL_CAPSULE | Freq: Every day | ORAL | 0 refills | Status: DC

## 2022-07-30 MED ORDER — CONCEPT DHA 53.5-38-1 MG PO CAPS: 4.0000 | ORAL_CAPSULE | Freq: Every day | ORAL | 0 refills | Status: AC

## 2022-07-30 NOTE — Patient Instructions (Signed)

## 2022-07-30 NOTE — Progress Notes (Signed)
Patient is here for pregnancy confirmation. She believes she could be pregnant. Pregnancy is desired. Pregnancy was planned and somewhat unplanned. Sexual Activity: 1 partner, unmarried   Contraception: None; Current symptoms also include: Positive home pregnancy test, urinary frequency and vomiting. Last menstrual period was normal.

## 2022-07-31 LAB — MICROSCOPIC EXAMINATION
Casts: NONE SEEN /lpf
Epithelial Cells (non renal): >10 /hpf — AB (ref 0–10)

## 2022-07-31 LAB — URINALYSIS, ROUTINE W REFLEX MICROSCOPIC
Bilirubin, UA: NEGATIVE
Glucose, UA: NEGATIVE
Ketones, UA: NEGATIVE
Nitrite, UA: NEGATIVE
Protein,UA: NEGATIVE
Specific Gravity, UA: 1.013 (ref 1.005–1.030)
Urobilinogen, Ur: 0.2 mg/dL (ref 0.2–1.0)
pH, UA: 7 (ref 5.0–7.5)

## 2022-08-01 LAB — PAIN MGT SCRN (14 DRUGS), UR
Amphetamine Scrn, Ur: NEGATIVE ng/mL
BARBITURATE SCREEN URINE: NEGATIVE ng/mL
BENZODIAZEPINE SCREEN, URINE: NEGATIVE ng/mL
Buprenorphine, Urine: NEGATIVE ng/mL
CANNABINOIDS UR QL SCN: NEGATIVE ng/mL
Cocaine (Metab) Scrn, Ur: NEGATIVE ng/mL
Creatinine(Crt), U: 65.3 mg/dL (ref 20.0–300.0)
Fentanyl, Urine: NEGATIVE pg/mL
Meperidine Screen, Urine: NEGATIVE ng/mL
Methadone Screen, Urine: NEGATIVE ng/mL
OXYCODONE+OXYMORPHONE UR QL SCN: NEGATIVE ng/mL
Opiate Scrn, Ur: NEGATIVE ng/mL
Ph of Urine: 8.3 (ref 4.5–8.9)
Phencyclidine Qn, Ur: NEGATIVE ng/mL
Propoxyphene Scrn, Ur: NEGATIVE ng/mL
Tramadol Screen, Urine: NEGATIVE ng/mL

## 2022-08-01 LAB — NICOTINE SCREEN, URINE: Cotinine Ql Scrn, Ur: POSITIVE ng/mL — AB

## 2022-08-02 LAB — URINE CULTURE

## 2022-08-02 LAB — GC/CHLAMYDIA PROBE AMP
Chlamydia trachomatis, NAA: NEGATIVE
Neisseria Gonorrhoeae by PCR: NEGATIVE

## 2022-08-02 LAB — URINE CULTURE, OB REFLEX

## 2022-08-02 LAB — CULTURE, OB URINE

## 2022-08-03 ENCOUNTER — Ambulatory Visit (INDEPENDENT_AMBULATORY_CARE_PROVIDER_SITE_OTHER): Payer: Medicaid Other | Admitting: Obstetrics and Gynecology

## 2022-08-03 ENCOUNTER — Other Ambulatory Visit: Payer: Self-pay

## 2022-08-03 VITALS — Ht 62.0 in | Wt 288.0 lb

## 2022-08-03 DIAGNOSIS — Z3A08 8 weeks gestation of pregnancy: Secondary | ICD-10-CM

## 2022-08-03 DIAGNOSIS — O09899 Supervision of other high risk pregnancies, unspecified trimester: Secondary | ICD-10-CM

## 2022-08-03 DIAGNOSIS — Z8759 Personal history of other complications of pregnancy, childbirth and the puerperium: Secondary | ICD-10-CM

## 2022-08-03 DIAGNOSIS — Z8632 Personal history of gestational diabetes: Secondary | ICD-10-CM

## 2022-08-03 DIAGNOSIS — Z3A00 Weeks of gestation of pregnancy not specified: Secondary | ICD-10-CM

## 2022-08-03 DIAGNOSIS — O219 Vomiting of pregnancy, unspecified: Secondary | ICD-10-CM

## 2022-08-03 DIAGNOSIS — O99210 Obesity complicating pregnancy, unspecified trimester: Secondary | ICD-10-CM

## 2022-08-03 DIAGNOSIS — Z3481 Encounter for supervision of other normal pregnancy, first trimester: Secondary | ICD-10-CM

## 2022-08-03 DIAGNOSIS — O09891 Supervision of other high risk pregnancies, first trimester: Secondary | ICD-10-CM

## 2022-08-03 DIAGNOSIS — Z113 Encounter for screening for infections with a predominantly sexual mode of transmission: Secondary | ICD-10-CM

## 2022-08-03 DIAGNOSIS — Z1379 Encounter for other screening for genetic and chromosomal anomalies: Secondary | ICD-10-CM

## 2022-08-03 MED ORDER — PRENATE MINI 18-0.6-0.4-350 MG PO CAPS: 1.0000 | ORAL_CAPSULE | Freq: Every day | ORAL | 11 refills | Status: DC

## 2022-08-03 MED ORDER — BONJESTA 20-20 MG PO TBCR: 20.0000 mg | EXTENDED_RELEASE_TABLET | Freq: Two times a day (BID) | ORAL | 1 refills | Status: DC | PRN

## 2022-08-03 NOTE — Progress Notes (Signed)
New OB Intake  I connected with  Dominique Sanders on 08/03/22 at  2:15 PM EDT by telephone Visit and verified that I am speaking with the correct person using two identifiers. Nurse is located at [facility] and pt is located at home.  I discussed the limitations, risks, security and privacy concerns of performing an evaluation and management service by telephone and the availability of in person appointments. I also discussed with the patient that there may be a patient responsible charge related to this service. The patient expressed understanding and agreed to proceed.  I explained I am completing New OB Intake today. We discussed her EDD of 03/10/2023 that is based on LMP of 06/03/22. Pt is G2/P1001. I reviewed her allergies, medications, Medical/Surgical/OB history, and appropriate screenings. Based on history, this is a/an pregnancy complicated by gestational diabetes and pre-eclampsia .   Patient Active Problem List   Diagnosis Date Noted   History of gestational diabetes 10/15/2019   BMI 50.0-59.9, adult (HCC) 08/11/2019    Concerns addressed today  Delivery Plans:  Plans to deliver at [facility]  Anatomy US Explained first scheduled [place] will be around 19 weeks.  Labs Discussed genetic screening with patient. Patient would like genetic testing to be drawn at new OB visit. Discussed possible labs to be drawn at new OB appointment.  COVID Vaccine Patient has had COVID vaccine.   Social Determinants of Health Food Insecurity: denies food insecurity WIC Referral: Patient is not interested in referral to [facility].  Transportation: Patient denies transportation needs. Childcare: Discussed no children allowed at ultrasound appointments.   First visit review I reviewed new OB appt with pt. I explained she will have ob bloodwork and pap smear/pelvic exam if indicated. Explained pt will be seen by Dr. Valentino Saxon at first visit; encounter routed to appropriate provider.   Loman Chroman, CMA 08/03/2022  12:04 PM

## 2022-08-13 ENCOUNTER — Other Ambulatory Visit: Payer: Medicaid Other

## 2022-08-13 ENCOUNTER — Ambulatory Visit (INDEPENDENT_AMBULATORY_CARE_PROVIDER_SITE_OTHER): Payer: Medicaid Other

## 2022-08-13 DIAGNOSIS — Z3A08 8 weeks gestation of pregnancy: Secondary | ICD-10-CM

## 2022-08-13 DIAGNOSIS — O99210 Obesity complicating pregnancy, unspecified trimester: Secondary | ICD-10-CM

## 2022-08-13 DIAGNOSIS — O09891 Supervision of other high risk pregnancies, first trimester: Secondary | ICD-10-CM

## 2022-08-13 DIAGNOSIS — Z113 Encounter for screening for infections with a predominantly sexual mode of transmission: Secondary | ICD-10-CM

## 2022-08-14 ENCOUNTER — Encounter: Payer: Self-pay | Admitting: Obstetrics and Gynecology

## 2022-08-14 LAB — URINALYSIS, ROUTINE W REFLEX MICROSCOPIC
Bilirubin, UA: NEGATIVE
Glucose, UA: NEGATIVE
Ketones, UA: NEGATIVE
Nitrite, UA: NEGATIVE
Protein,UA: NEGATIVE
Specific Gravity, UA: 1.013 (ref 1.005–1.030)
Urobilinogen, Ur: 0.2 mg/dL (ref 0.2–1.0)
pH, UA: 7.5 (ref 5.0–7.5)

## 2022-08-14 LAB — MICROSCOPIC EXAMINATION
Casts: NONE SEEN /lpf
Epithelial Cells (non renal): >10 /hpf — AB (ref 0–10)
RBC, Urine: NONE SEEN /hpf (ref 0–2)

## 2022-08-14 LAB — RUBELLA SCREEN: Rubella Antibodies, IGG: 5.59 index (ref >0.99)

## 2022-08-14 LAB — HGB SOLU + RFLX FRAC: Sickle Solubility Test - HGBRFX: NEGATIVE

## 2022-08-14 LAB — RPR: RPR Ser Ql: NONREACTIVE

## 2022-08-14 LAB — HIV ANTIBODY (ROUTINE TESTING W REFLEX): HIV Screen 4th Generation wRfx: NONREACTIVE

## 2022-08-14 LAB — ANTIBODY SCREEN: Antibody Screen: NEGATIVE

## 2022-08-14 LAB — ABO AND RH: Rh Factor: POSITIVE

## 2022-08-14 LAB — VIRAL HEPATITIS HBV, HCV
HCV Ab: NONREACTIVE
Hep B Core Total Ab: NEGATIVE
Hep B Surface Ab, Qual: NONREACTIVE
Hepatitis B Surface Ag: NEGATIVE

## 2022-08-14 LAB — VARICELLA ZOSTER ANTIBODY, IGG: Varicella zoster IgG: 1614 index (ref >165)

## 2022-08-14 LAB — HEMOGLOBIN A1C
Est. average glucose Bld gHb Est-mCnc: 120 mg/dL
Hgb A1c MFr Bld: 5.8 % — ABNORMAL HIGH (ref 4.8–5.6)

## 2022-08-14 LAB — HCV INTERPRETATION

## 2022-08-15 ENCOUNTER — Other Ambulatory Visit: Payer: Self-pay

## 2022-08-15 ENCOUNTER — Other Ambulatory Visit: Payer: Self-pay | Admitting: Obstetrics and Gynecology

## 2022-08-15 DIAGNOSIS — O219 Vomiting of pregnancy, unspecified: Secondary | ICD-10-CM

## 2022-08-15 DIAGNOSIS — Z3A08 8 weeks gestation of pregnancy: Secondary | ICD-10-CM

## 2022-08-15 DIAGNOSIS — Z3481 Encounter for supervision of other normal pregnancy, first trimester: Secondary | ICD-10-CM

## 2022-08-15 LAB — PAIN MGT SCRN (14 DRUGS), UR
Amphetamine Scrn, Ur: NEGATIVE ng/mL
BARBITURATE SCREEN URINE: NEGATIVE ng/mL
BENZODIAZEPINE SCREEN, URINE: NEGATIVE ng/mL
Buprenorphine, Urine: NEGATIVE ng/mL
CANNABINOIDS UR QL SCN: NEGATIVE ng/mL
Cocaine (Metab) Scrn, Ur: NEGATIVE ng/mL
Creatinine(Crt), U: 67 mg/dL (ref 20.0–300.0)
Fentanyl, Urine: NEGATIVE pg/mL
Meperidine Screen, Urine: NEGATIVE ng/mL
Methadone Screen, Urine: NEGATIVE ng/mL
OXYCODONE+OXYMORPHONE UR QL SCN: NEGATIVE ng/mL
Opiate Scrn, Ur: NEGATIVE ng/mL
Ph of Urine: 8.9 (ref 4.5–8.9)
Phencyclidine Qn, Ur: NEGATIVE ng/mL
Propoxyphene Scrn, Ur: NEGATIVE ng/mL
Tramadol Screen, Urine: NEGATIVE ng/mL

## 2022-08-15 LAB — GC/CHLAMYDIA PROBE AMP
Chlamydia trachomatis, NAA: NEGATIVE
Neisseria Gonorrhoeae by PCR: NEGATIVE

## 2022-08-15 LAB — URINE CULTURE, OB REFLEX

## 2022-08-15 LAB — NICOTINE SCREEN, URINE: Cotinine Ql Scrn, Ur: NEGATIVE ng/mL

## 2022-08-15 LAB — CULTURE, OB URINE

## 2022-08-15 MED ORDER — PRENATE MINI 18-0.6-0.4-350 MG PO CAPS: ORAL_CAPSULE | ORAL | 11 refills | Status: DC

## 2022-08-15 MED ORDER — BONJESTA 20-20 MG PO TBCR: EXTENDED_RELEASE_TABLET | ORAL | 0 refills | Status: DC

## 2022-08-16 MED ORDER — DOXYLAMINE-PYRIDOXINE 10-10 MG PO TBEC: 2.0000 | DELAYED_RELEASE_TABLET | Freq: Every day | ORAL | 5 refills | Status: DC

## 2022-09-04 ENCOUNTER — Encounter: Payer: Self-pay | Admitting: Obstetrics and Gynecology

## 2022-09-05 NOTE — Progress Notes (Signed)
OBSTETRIC INITIAL PRENATAL VISIT  Subjective:    Dominique Sanders is being seen today for her first obstetrical visit.  This is a planned pregnancy. She is a 26 y.o. G14P1001 female at [redacted]w[redacted]d gestation, Estimated Date of Delivery: 03/10/23 with Patient's last menstrual period was 06/03/2022 (exact date)., inconsistent with 6 week sono. Her obstetrical history is significant for morbid obesity, pre-eclampsia and gestational diabetes  in prior pregnancy. Relationship with FOB: significant other, not living together (different FOB from prior pregnancy). Patient does intend to breast feed. Pregnancy history fully reviewed.  Notes that she has been working to lose weight prior to conception. Has also changed her eating habits, does not really eat out much anymore, working on eating healthier meals, and now is walking more.    OB History  Gravida Para Term Preterm AB Living  2 1 1  0 0 1  SAB IAB Ectopic Multiple Live Births  0 0 0 0 1    # Outcome Date GA Lbr Len/2nd Weight Sex Delivery Anes PTL Lv  2 Current           1 Term 01/2021 [redacted]w[redacted]d  7 lb 10 oz (3.459 kg)  Vag-Spont   LIV    Gynecologic History:  Last pap smear was 08/11/2019.  Results were Normal.  Denies h/o abnormal pap smears in the past.  Denies history of STIs.  Contraception prior to conception: None   Past Medical History:  Diagnosis Date   History of gestational diabetes 10/15/2019   Preeclampsia 01/08/2020   Results for orders placed or performed during the hospital encounter of 01/07/20 (from the past 24 hour(s)) Urinalysis, Routine w reflex microscopic     Status: Abnormal  Collection Time: 01/07/20 11:20 PM Result Value Ref Range  Color, Urine STRAW (A) YELLOW  APPearance CLEAR CLEAR  Specific Gravity, Urine 1.009 1.005 - 1.030  pH 7.0 5.0 - 8.0  Glucose, UA >=500 (A) NEGATIVE mg/dL  Hgb urine dips    Family History  Problem Relation Age of Onset   Healthy Mother    Healthy Father     Past Surgical History:  Procedure  Laterality Date   no surgical       Social History   Socioeconomic History   Marital status: Single    Spouse name: Not on file   Number of children: Not on file   Years of education: Not on file   Highest education level: Not on file  Occupational History   Not on file  Tobacco Use   Smoking status: Never   Smokeless tobacco: Never  Vaping Use   Vaping Use: Former  Substance and Sexual Activity   Alcohol use: Not Currently   Drug use: Never   Sexual activity: Not Currently    Birth control/protection: None  Other Topics Concern   Not on file  Social History Narrative   Not on file   Social Determinants of Health   Financial Resource Strain: Not on file  Food Insecurity: Not on file  Transportation Needs: Not on file  Physical Activity: Not on file  Stress: Not on file  Social Connections: Not on file  Intimate Partner Violence: Not on file    Current Outpatient Medications on File Prior to Visit  Medication Sig Dispense Refill   Doxylamine-Pyridoxine (DICLEGIS) 10-10 MG TBEC Take 2 tablets by mouth at bedtime. If symptoms persist, add one tablet in the morning and one in the afternoon 100 tablet 5   Prenat-FeCbn-FeAsp-Meth-FA-DHA (PRENATE MINI) 18-0.6-0.4-350 MG  CAPS Take 1 capsule daily. 30 capsule 11   No current facility-administered medications on file prior to visit.    No Known Allergies   Review of Systems General: Not Present- Fever, Weight Loss and Weight Gain. Skin: Not Present- Rash. HEENT: Not Present- Blurred Vision, Headache and Bleeding Gums. Respiratory: Not Present- Difficulty Breathing. Breast: Not Present- Breast Mass. Cardiovascular: Not Present- Chest Pain, Elevated Blood Pressure, Fainting / Blacking Out and Shortness of Breath. Gastrointestinal: Not Present- Abdominal Pain, Constipation, Nausea and Vomiting. Female Genitourinary: Present - hematuria. Not Present- Frequency, Painful Urination, Pelvic Pain, Vaginal Bleeding, Vaginal  Discharge, Contractions, regular, Fetal Movements Decreased, Urinary Complaints and Vaginal Fluid. Musculoskeletal: Not Present- Back Pain and Leg Cramps. Neurological: Not Present- Dizziness. Psychiatric: Not Present- Depression.     Objective:   Blood pressure (!) 110/94, pulse 92, weight 296 lb (134.3 kg), last menstrual period 06/03/2022, not currently breastfeeding.  Body mass index is 54.14 kg/m.  General Appearance:    Alert, cooperative, no distress, appears stated age, morbid obesity  Head:    Normocephalic, without obvious abnormality, atraumatic  Eyes:    PERRL, conjunctiva/corneas clear, EOM's intact, both eyes  Ears:    Normal external ear canals, both ears  Nose:   Nares normal, septum midline, mucosa normal, no drainage or sinus tenderness  Throat:   Lips, mucosa, and tongue normal; teeth and gums normal  Neck:   Supple, symmetrical, trachea midline, no adenopathy; thyroid: no enlargement/tenderness/nodules; no carotid bruit or JVD  Back:     Symmetric, no curvature, ROM normal, no CVA tenderness  Lungs:     Clear to auscultation bilaterally, respirations unlabored  Chest Wall:    No tenderness or deformity   Heart:    Regular rate and rhythm, S1 and S2 normal, no murmur, rub or gallop  Breast Exam:    No tenderness, masses, or nipple abnormality  Abdomen:     Soft, non-tender, bowel sounds active all four quadrants, no masses, no organomegaly.  FHT 153 bpm (performed by bedside sono).  Genitalia:   Pelvic:external genitalia normal, vagina without lesions, or tenderness, small amount of yellow-white discharge present. Rectovaginal septum  normal. Cervix normal in appearance, no cervical motion tenderness, no adnexal masses or tenderness.     Rectal:    Normal external sphincter.  No hemorrhoids appreciated. Internal exam not done.   Extremities:   Extremities normal, atraumatic, no cyanosis or edema  Pulses:   2+ and symmetric all extremities  Skin:   Skin color, texture,  turgor normal, no rashes or lesions  Lymph nodes:   Cervical, supraclavicular, and axillary nodes normal  Neurologic:   CNII-XII intact, normal strength, sensation and reflexes throughout     Assessment:   1. Encounter for supervision of other normal pregnancy in second trimester   2. [redacted] weeks gestation of pregnancy   3. Genetic screening   4. Cervical cancer screening   5. Hematuria, unspecified type     Plan:   1. Supervision of high risk pregnancy in first trimester - Initial labs reviewed. - Prenatal vitamins encouraged. - Problem list reviewed and updated. - New OB counseling:  The patient has been given an overview regarding routine prenatal care.  Recommendations regarding diet, weight gain, and exercise in pregnancy were given. - Prenatal testing, optional genetic testing, and ultrasound use in pregnancy were reviewed.  Traditional genetic screening vs cell-fee DNA genetic screening discussed, including risks and benefits. Testing requested. To perform next visit (dates changed, based on [place]  patient only 9 weeks today).  - Benefits of Breast Feeding were discussed. The patient is encouraged to consider nursing her baby post partum. - The patient has Medicaid.  [facility] Medicaid Risk Screening Form completed today   2. Cervical cancer screening - Pap smear performed today.   3. Hematuria, unspecified type - Patient noticing hematuria, UA today appears to show evidence of UTI. Will order culture and treat with Macrobid.   4. History of pre-eclampsia - Discussed need to begin daily baby aspirin, 162 mg for possible prevention/delay of onset of pre-eclampsia this pregnancy.   5. History of gestational diabetes - Patient with h/o GDM in last pregnancy, required Glyburide. Early A1c elevated. Will need early glucola around 16-18 weeks.   6. Obesity in pregnancy - Elevated BMI, to begin aspirin.  Will need Anesthesia consult early in pregnancy.  - Will need serial growth scans  beginning at [redacted] weeks gestation - To begin antenatal testing around 32-[redacted] weeks gestation.    Follow up in 4 weeks.    Hildred Laser, MD [facility]

## 2022-09-06 ENCOUNTER — Other Ambulatory Visit (HOSPITAL_COMMUNITY)
Admission: RE | Admit: 2022-09-06 | Discharge: 2022-09-06 | Disposition: A | Discharge status: Home / Self Care | Payer: Medicaid Other | Source: Ambulatory Visit | Attending: Obstetrics and Gynecology | Admitting: Obstetrics and Gynecology

## 2022-09-06 ENCOUNTER — Encounter: Payer: Self-pay | Admitting: Obstetrics and Gynecology

## 2022-09-06 ENCOUNTER — Ambulatory Visit (INDEPENDENT_AMBULATORY_CARE_PROVIDER_SITE_OTHER): Payer: Medicaid Other | Admitting: Obstetrics and Gynecology

## 2022-09-06 VITALS — BP 110/94 | HR 92 | Ht 62.0 in | Wt 296.0 lb

## 2022-09-06 DIAGNOSIS — Z3482 Encounter for supervision of other normal pregnancy, second trimester: Secondary | ICD-10-CM | POA: Insufficient documentation

## 2022-09-06 DIAGNOSIS — R319 Hematuria, unspecified: Secondary | ICD-10-CM

## 2022-09-06 DIAGNOSIS — Z1379 Encounter for other screening for genetic and chromosomal anomalies: Secondary | ICD-10-CM

## 2022-09-06 DIAGNOSIS — O0990 Supervision of high risk pregnancy, unspecified, unspecified trimester: Secondary | ICD-10-CM | POA: Insufficient documentation

## 2022-09-06 DIAGNOSIS — Z124 Encounter for screening for malignant neoplasm of cervix: Secondary | ICD-10-CM | POA: Insufficient documentation

## 2022-09-06 DIAGNOSIS — Z3A13 13 weeks gestation of pregnancy: Secondary | ICD-10-CM | POA: Insufficient documentation

## 2022-09-06 DIAGNOSIS — Z3A09 9 weeks gestation of pregnancy: Secondary | ICD-10-CM

## 2022-09-06 DIAGNOSIS — Z8632 Personal history of gestational diabetes: Secondary | ICD-10-CM

## 2022-09-06 DIAGNOSIS — O99210 Obesity complicating pregnancy, unspecified trimester: Secondary | ICD-10-CM

## 2022-09-06 DIAGNOSIS — O0991 Supervision of high risk pregnancy, unspecified, first trimester: Secondary | ICD-10-CM

## 2022-09-06 DIAGNOSIS — Z8759 Personal history of other complications of pregnancy, childbirth and the puerperium: Secondary | ICD-10-CM | POA: Insufficient documentation

## 2022-09-06 LAB — POCT URINALYSIS DIPSTICK OB
Bilirubin, UA: NEGATIVE
Glucose, UA: NEGATIVE
Ketones, UA: NEGATIVE
Nitrite, UA: NEGATIVE
Spec Grav, UA: 1.015 (ref 1.010–1.025)
Urobilinogen, UA: 0.2 E.U./dL
pH, UA: 6.5 (ref 5.0–8.0)

## 2022-09-06 MED ORDER — NITROFURANTOIN MONOHYD MACRO 100 MG PO CAPS: 100.0000 mg | ORAL_CAPSULE | Freq: Two times a day (BID) | ORAL | 0 refills | Status: DC

## 2022-09-06 MED ORDER — ASPIRIN 81 MG PO TBEC: 162.0000 mg | DELAYED_RELEASE_TABLET | Freq: Every day | ORAL | 3 refills | Status: DC

## 2022-09-06 NOTE — Patient Instructions (Signed)
WHAT OB PATIENTS CAN EXPECT  Confirmation of pregnancy and ultrasound ordered if medically indicated-[redacted] weeks gestation New OB (NOB) intake with nurse and New OB (NOB) labs- [redacted] weeks gestation New OB (NOB) physical examination with provider- 11/[redacted] weeks gestation Flu vaccine-[redacted] weeks gestation Anatomy scan-[redacted] weeks gestation Glucose tolerance test, blood work to test for anemia, T-dap vaccine-[redacted] weeks gestation Vaginal swabs/cultures-STD/Group B strep-[redacted] weeks gestation Appointments every 4 weeks until 28 weeks Every 2 weeks from 28 weeks until 36 weeks Weekly visits from 36 weeks until delivery   

## 2022-09-06 NOTE — Progress Notes (Signed)
ROB: She has concerns about left arm numbness that comes and goes.

## 2022-09-08 LAB — URINE CULTURE

## 2022-09-10 ENCOUNTER — Other Ambulatory Visit: Payer: Medicaid Other

## 2022-09-12 ENCOUNTER — Other Ambulatory Visit: Payer: Self-pay | Admitting: Obstetrics and Gynecology

## 2022-09-12 ENCOUNTER — Other Ambulatory Visit: Payer: Medicaid Other

## 2022-09-12 ENCOUNTER — Encounter: Payer: Self-pay | Admitting: Obstetrics and Gynecology

## 2022-09-12 DIAGNOSIS — A5901 Trichomonal vulvovaginitis: Secondary | ICD-10-CM

## 2022-09-12 DIAGNOSIS — Z1379 Encounter for other screening for genetic and chromosomal anomalies: Secondary | ICD-10-CM

## 2022-09-12 DIAGNOSIS — Z3A08 8 weeks gestation of pregnancy: Secondary | ICD-10-CM

## 2022-09-12 DIAGNOSIS — O09891 Supervision of other high risk pregnancies, first trimester: Secondary | ICD-10-CM

## 2022-09-12 HISTORY — DX: Trichomonal vulvovaginitis: A59.01

## 2022-09-12 LAB — CYTOLOGY - PAP
Diagnosis: NEGATIVE
Diagnosis: REACTIVE

## 2022-09-12 MED ORDER — METRONIDAZOLE 500 MG PO TABS: ORAL_TABLET | ORAL | 1 refills | Status: DC

## 2022-09-18 ENCOUNTER — Encounter: Payer: Self-pay | Admitting: Obstetrics and Gynecology

## 2022-09-19 LAB — MATERNIT21  PLUS CORE+ESS+SCA, BLOOD
11q23 deletion (Jacobsen): NOT DETECTED
15q11 deletion (PW Angelman): NOT DETECTED
1p36 deletion syndrome: NOT DETECTED
22q11 deletion (DiGeorge): NOT DETECTED
4p16 deletion(Wolf-Hirschhorn): NOT DETECTED
5p15 deletion (Cri-du-chat): NOT DETECTED
8q24 deletion (Langer-Giedion): NOT DETECTED
Fetal Fraction: 3
Monosomy X (Turner Syndrome): NOT DETECTED
Result (T21): NEGATIVE
Trisomy 13 (Patau syndrome): NEGATIVE
Trisomy 16: NOT DETECTED
Trisomy 18 (Edwards syndrome): NEGATIVE
Trisomy 21 (Down syndrome): NEGATIVE
Trisomy 22: NOT DETECTED
XXX (Triple X Syndrome): NOT DETECTED
XXY (Klinefelter Syndrome): NOT DETECTED
XYY (Jacobs Syndrome): NOT DETECTED

## 2022-09-24 ENCOUNTER — Encounter: Payer: Self-pay | Admitting: Physician Assistant

## 2022-09-24 NOTE — Telephone Encounter (Signed)
Accidentally sent to me; forwarded to OB/GYN

## 2022-10-02 ENCOUNTER — Encounter: Payer: Self-pay | Admitting: Obstetrics and Gynecology

## 2022-10-02 ENCOUNTER — Ambulatory Visit (INDEPENDENT_AMBULATORY_CARE_PROVIDER_SITE_OTHER): Payer: Medicaid Other | Admitting: Obstetrics and Gynecology

## 2022-10-02 ENCOUNTER — Other Ambulatory Visit (HOSPITAL_COMMUNITY)
Admission: RE | Admit: 2022-10-02 | Discharge: 2022-10-02 | Disposition: A | Discharge status: Home / Self Care | Payer: Medicaid Other | Source: Ambulatory Visit | Attending: Obstetrics and Gynecology | Admitting: Obstetrics and Gynecology

## 2022-10-02 VITALS — BP 131/74 | HR 99 | Wt 300.2 lb

## 2022-10-02 DIAGNOSIS — Z3A13 13 weeks gestation of pregnancy: Secondary | ICD-10-CM

## 2022-10-02 DIAGNOSIS — O23591 Infection of other part of genital tract in pregnancy, first trimester: Secondary | ICD-10-CM

## 2022-10-02 DIAGNOSIS — O99210 Obesity complicating pregnancy, unspecified trimester: Secondary | ICD-10-CM

## 2022-10-02 DIAGNOSIS — Z8759 Personal history of other complications of pregnancy, childbirth and the puerperium: Secondary | ICD-10-CM

## 2022-10-02 DIAGNOSIS — E669 Obesity, unspecified: Secondary | ICD-10-CM

## 2022-10-02 DIAGNOSIS — O0992 Supervision of high risk pregnancy, unspecified, second trimester: Secondary | ICD-10-CM

## 2022-10-02 DIAGNOSIS — A5901 Trichomonal vulvovaginitis: Secondary | ICD-10-CM | POA: Insufficient documentation

## 2022-10-02 DIAGNOSIS — O0991 Supervision of high risk pregnancy, unspecified, first trimester: Secondary | ICD-10-CM | POA: Insufficient documentation

## 2022-10-02 DIAGNOSIS — O09291 Supervision of pregnancy with other poor reproductive or obstetric history, first trimester: Secondary | ICD-10-CM

## 2022-10-02 DIAGNOSIS — Z2821 Immunization not carried out because of patient refusal: Secondary | ICD-10-CM

## 2022-10-02 DIAGNOSIS — Z87440 Personal history of urinary (tract) infections: Secondary | ICD-10-CM

## 2022-10-02 DIAGNOSIS — Z8632 Personal history of gestational diabetes: Secondary | ICD-10-CM

## 2022-10-02 LAB — POCT URINALYSIS DIPSTICK OB
Bilirubin, UA: NEGATIVE
Glucose, UA: NEGATIVE
Ketones, UA: NEGATIVE
Nitrite, UA: NEGATIVE
Spec Grav, UA: 1.015 (ref 1.010–1.025)
Urobilinogen, UA: 0.2 E.U./dL
pH, UA: 6.5 (ref 5.0–8.0)

## 2022-10-02 NOTE — Progress Notes (Signed)
ROB: Doing well, no issues.  Needs TOC for trichomonas infection, patient notes she is unsure if she and partner took the medication correctly. Also needs TOC for recent UTI.  Discussed need for Anesthesia consult, will schedule. Advised on need for early 1 hr glucola due to elevated A1c and h/o GDM in last pregnancy. Will perform next visit. Declines flu vaccine. Normal MaterniT21, for AFP next visit.  RTC in 4 weeks.

## 2022-10-02 NOTE — Progress Notes (Signed)
ROB: She is doing well, she is still having some nausea and vomiting.

## 2022-10-03 LAB — CERVICOVAGINAL ANCILLARY ONLY
Comment: NEGATIVE
Trichomonas: POSITIVE — AB

## 2022-10-03 MED ORDER — METRONIDAZOLE 500 MG PO TABS: ORAL_TABLET | ORAL | 1 refills | Status: DC

## 2022-10-03 NOTE — Addendum Note (Signed)
Addended by: Augusto Gamble on: 10/03/2022 02:54 PM   Modules accepted: Orders

## 2022-10-04 LAB — URINE CULTURE

## 2022-10-08 ENCOUNTER — Encounter: Payer: Self-pay | Admitting: Obstetrics and Gynecology

## 2022-10-09 ENCOUNTER — Encounter: Payer: Self-pay | Admitting: Obstetrics and Gynecology

## 2022-10-25 ENCOUNTER — Inpatient Hospital Stay: Admission: RE | Admit: 2022-10-25 | Payer: Medicaid Other | Source: Ambulatory Visit

## 2022-10-30 ENCOUNTER — Other Ambulatory Visit: Payer: Medicaid Other

## 2022-10-30 ENCOUNTER — Ambulatory Visit (INDEPENDENT_AMBULATORY_CARE_PROVIDER_SITE_OTHER): Payer: Medicaid Other | Admitting: Obstetrics and Gynecology

## 2022-10-30 ENCOUNTER — Encounter: Payer: Self-pay | Admitting: Obstetrics and Gynecology

## 2022-10-30 ENCOUNTER — Other Ambulatory Visit: Payer: Self-pay

## 2022-10-30 VITALS — BP 104/74 | HR 77 | Wt 310.4 lb

## 2022-10-30 DIAGNOSIS — O0992 Supervision of high risk pregnancy, unspecified, second trimester: Secondary | ICD-10-CM

## 2022-10-30 DIAGNOSIS — Z3689 Encounter for other specified antenatal screening: Secondary | ICD-10-CM

## 2022-10-30 DIAGNOSIS — O99891 Other specified diseases and conditions complicating pregnancy: Secondary | ICD-10-CM

## 2022-10-30 DIAGNOSIS — R82998 Other abnormal findings in urine: Secondary | ICD-10-CM

## 2022-10-30 DIAGNOSIS — Z1379 Encounter for other screening for genetic and chromosomal anomalies: Secondary | ICD-10-CM

## 2022-10-30 DIAGNOSIS — R7309 Other abnormal glucose: Secondary | ICD-10-CM

## 2022-10-30 DIAGNOSIS — Z3A17 17 weeks gestation of pregnancy: Secondary | ICD-10-CM

## 2022-10-30 LAB — POCT URINALYSIS DIPSTICK OB
Bilirubin, UA: NEGATIVE
Glucose, UA: NEGATIVE
Ketones, UA: NEGATIVE
Nitrite, UA: NEGATIVE
Spec Grav, UA: 1.015 (ref 1.010–1.025)
Urobilinogen, UA: 0.2 E.U./dL
pH, UA: 6.5 (ref 5.0–8.0)

## 2022-10-30 MED ORDER — NITROFURANTOIN MONOHYD MACRO 100 MG PO CAPS: 100.0000 mg | ORAL_CAPSULE | Freq: Two times a day (BID) | ORAL | 0 refills | Status: DC

## 2022-10-30 NOTE — Progress Notes (Signed)
ROB. Patient states starting to feel flutters, no pain or pressure at this time. Early glucose test done today. AFP ordered.   Patient states no questions or concerns at this time.

## 2022-10-30 NOTE — Progress Notes (Signed)
ROB: Early 1 hour GCT today.  Patient taking vitamins and 2 baby aspirin daily as directed.  aFP today.  Anatomic ultrasound scheduled for next visit.  Patient has an appointment for anesthesia evaluation for elevated BMI.  Elevated BMI discussed.  Limitation of 60 discussed and patient aware of this cut off.  Blood and large leukocytes noted in urine-prescription given for Macrobid.

## 2022-10-31 LAB — GLUCOSE, 1 HOUR GESTATIONAL: Gestational Diabetes Screen: 121 mg/dL (ref 70–139)

## 2022-11-01 ENCOUNTER — Encounter: Payer: Self-pay | Admitting: Obstetrics and Gynecology

## 2022-11-01 LAB — URINE CULTURE

## 2022-11-01 LAB — AFP, SERUM, OPEN SPINA BIFIDA
AFP MoM: 0.88
AFP Value: 25.2 ng/mL
Gest. Age on Collection Date: 17.1 weeks
Maternal Age At EDD: 26.3 yr
OSBR Risk 1 IN: 10000
Test Results:: NEGATIVE
Weight: 310 [lb_av]

## 2022-11-13 ENCOUNTER — Inpatient Hospital Stay: Admission: RE | Admit: 2022-11-13 | Payer: Medicaid Other | Source: Ambulatory Visit

## 2022-11-26 ENCOUNTER — Encounter: Payer: Medicaid Other | Admitting: Obstetrics & Gynecology

## 2022-11-26 ENCOUNTER — Ambulatory Visit (INDEPENDENT_AMBULATORY_CARE_PROVIDER_SITE_OTHER): Payer: Medicaid Other

## 2022-11-26 DIAGNOSIS — O0992 Supervision of high risk pregnancy, unspecified, second trimester: Secondary | ICD-10-CM

## 2022-11-26 DIAGNOSIS — Z3A21 21 weeks gestation of pregnancy: Secondary | ICD-10-CM

## 2022-11-26 DIAGNOSIS — Z3689 Encounter for other specified antenatal screening: Secondary | ICD-10-CM | POA: Diagnosis not present

## 2022-12-04 ENCOUNTER — Telehealth: Payer: Self-pay | Admitting: Licensed Clinical Social Worker

## 2022-12-04 NOTE — Telephone Encounter (Signed)
-----   Message from Esmeralda Links, RN sent at 12/03/2022  4:07 PM EST ----- Good afternoon Marchelle Folks, I am following the attached patient. She states she is expencing some feelings of "stress" and depression. She believes this is due to some issues between herself and FOB. She accepts a referral to you today. Please reach out to her.   Thank You Cassandra

## 2022-12-07 ENCOUNTER — Other Ambulatory Visit: Payer: Medicaid Other

## 2022-12-11 ENCOUNTER — Encounter: Payer: Self-pay | Admitting: Obstetrics and Gynecology

## 2022-12-11 ENCOUNTER — Ambulatory Visit (INDEPENDENT_AMBULATORY_CARE_PROVIDER_SITE_OTHER): Payer: Medicaid Other | Admitting: Obstetrics and Gynecology

## 2022-12-11 VITALS — BP 115/76 | HR 86 | Wt 313.7 lb

## 2022-12-11 DIAGNOSIS — R82998 Other abnormal findings in urine: Secondary | ICD-10-CM

## 2022-12-11 DIAGNOSIS — Z8632 Personal history of gestational diabetes: Secondary | ICD-10-CM

## 2022-12-11 DIAGNOSIS — Z131 Encounter for screening for diabetes mellitus: Secondary | ICD-10-CM

## 2022-12-11 DIAGNOSIS — Z3A23 23 weeks gestation of pregnancy: Secondary | ICD-10-CM

## 2022-12-11 DIAGNOSIS — O0992 Supervision of high risk pregnancy, unspecified, second trimester: Secondary | ICD-10-CM

## 2022-12-11 DIAGNOSIS — Z3689 Encounter for other specified antenatal screening: Secondary | ICD-10-CM

## 2022-12-11 LAB — POCT URINALYSIS DIPSTICK OB
Bilirubin, UA: NEGATIVE
Glucose, UA: NEGATIVE
Ketones, UA: NEGATIVE
Nitrite, UA: NEGATIVE
POC,PROTEIN,UA: NEGATIVE
Spec Grav, UA: 1.02 (ref 1.010–1.025)
Urobilinogen, UA: 0.2 E.U./dL
pH, UA: 6.5 (ref 5.0–8.0)

## 2022-12-11 MED ORDER — NITROFURANTOIN MONOHYD MACRO 100 MG PO CAPS: 100.0000 mg | ORAL_CAPSULE | Freq: Two times a day (BID) | ORAL | 0 refills | Status: DC

## 2022-12-11 NOTE — Addendum Note (Signed)
Addended by: Loman Chroman on: 12/11/2022 04:49 PM   Modules accepted: Orders

## 2022-12-11 NOTE — Progress Notes (Signed)
ROB: Doing well.  Reports fetal movement.  Urine seems consistent with UTI-Macrobid given.  Patient missed anesthesia consult visit this has been rescheduled till January.  1 hour GCT elevated-recommend 3-hour GTT.  Follow-up anatomy ultrasound ordered.

## 2022-12-11 NOTE — Progress Notes (Signed)
ROB. Patient states daily fetal movement, no pain or pressure. She states missing her anesthesiology consult, rescheduled for 12/28/22 at 10am. Follow-up anatomy ultrasound ordered. Patient states no other questions or concerns at this time.

## 2022-12-13 ENCOUNTER — Other Ambulatory Visit: Payer: Medicaid Other

## 2022-12-14 LAB — URINE CULTURE

## 2022-12-24 NOTE — L&D Delivery Note (Signed)
Delivery Summary for Dominique Sanders  Labor Events:   Preterm labor: No data found  Rupture date: 03/21/2023  Rupture time: 10:50 AM  Rupture type: Artificial Intact  Fluid Color: Clear  Induction: No data found  Augmentation: No data found  Complications: No data found  Cervical ripening: No data found No data found   No data found     Delivery:   Episiotomy: No data found  Lacerations: No data found  Repair suture: No data found  Repair # of packets: No data found  Blood loss (ml): 400   Information for the patient's newborn:  [place], [place] [id number]   Delivery 03/21/2023 5:47 PM by  Vaginal, Spontaneous Sex:  female Gestational Age: [redacted]w[redacted]d Delivery Clinician:   Living?:         APGARS  One minute Five minutes Ten minutes  Skin color:        Heart rate:        Grimace:        Muscle tone:        Breathing:        Totals: 8  9      Presentation/position:      Resuscitation:   Cord information:    Disposition of cord blood:     Blood gases sent?  Complications:   Placenta: Delivered:       appearance Newborn Measurements: Weight: 7 lb 9.3 oz (3440 g)  Height: 19.69"  Head circumference:    Chest circumference:    Other providers:    Additional  information: Forceps:   Vacuum:   Breech:   Observed anomalies        Delivery Note At 5:47 PM a viable and healthy female was delivered via Vaginal, Spontaneous (Presentation: Vertex; LOA position).  APGAR: 8, 9; weight 3440 grams.   Placenta status: Spontaneous, Intact.  Cord: 3 vessels with the following complications: None.  Cord pH: not indicated. Delayed cord clamping observed.   Anesthesia: Epidural Episiotomy: None Lacerations: 1st degree;Vaginal, hemostatic Suture Repair:  None Est. Blood Loss (mL): 400  Mom to postpartum.  Baby to Couplet care / Skin to Skin.  Rubie Maid, MD 03/21/2023, 6:18 PM

## 2022-12-28 ENCOUNTER — Encounter
Admission: RE | Admit: 2022-12-28 | Discharge: 2022-12-28 | Disposition: A | Discharge status: Home / Self Care | Payer: Medicaid Other | Source: Ambulatory Visit | Attending: Anesthesiology | Admitting: Anesthesiology

## 2022-12-28 NOTE — Consult Note (Signed)
[facility] Anesthesia Consultation  Dominique Sanders GQB:[id number] DOB: 21-Mar-1996 DOA: 12/28/2022 PCP: Rubie Maid, MD   Requesting physician: Dr. Amalia Hailey Date of consultation: 12/28/22 Reason for consultation: Obesity during pregnancy  CHIEF COMPLAINT:  Obesity during pregnancy  HISTORY OF PRESENT ILLNESS: Dominique Sanders  is a 27 y.o. female with a known history of class 3 obesity, GERD with pregnancy, and history of gestational diabetes with prior pregnancy.  PAST MEDICAL HISTORY:   Past Medical History:  Diagnosis Date   History of gestational diabetes 10/15/2019   Preeclampsia 01/08/2020   Results for orders placed or performed during the hospital encounter of 01/07/20 (from the past 24 hour(s)) Urinalysis, Routine w reflex microscopic     Status: Abnormal  Collection Time: 01/07/20 11:20 PM Result Value Ref Range  Color, Urine STRAW (A) YELLOW  APPearance CLEAR CLEAR  Specific Gravity, Urine 1.009 1.005 - 1.030  pH 7.0 5.0 - 8.0  Glucose, UA >=500 (A) NEGATIVE mg/dL  Hgb urine dips    PAST SURGICAL HISTORY:  Past Surgical History:  Procedure Laterality Date   no surgical       SOCIAL HISTORY:  Social History   Tobacco Use   Smoking status: Never   Smokeless tobacco: Never  Substance Use Topics   Alcohol use: Not Currently    FAMILY HISTORY:  Family History  Problem Relation Age of Onset   Healthy Mother    Healthy Father     DRUG ALLERGIES: No Known Allergies  REVIEW OF SYSTEMS:   RESPIRATORY: No cough, shortness of breath, wheezing.  CARDIOVASCULAR: No chest pain, orthopnea, edema.  HEMATOLOGY: No anemia, easy bruising or bleeding SKIN: No rash or lesion. NEUROLOGIC: No tingling, numbness, weakness.  PSYCHIATRY: No anxiety or depression.   MEDICATIONS AT HOME:  Prior to Admission medications   Medication Sig Start Date End Date Taking? Authorizing Provider  aspirin EC 81 MG tablet Take 2 tablets (162 mg total) by mouth daily.  Swallow whole. 09/06/22   Rubie Maid, MD  Doxylamine-Pyridoxine (DICLEGIS) 10-10 MG TBEC Take 2 tablets by mouth at bedtime. If symptoms persist, add one tablet in the morning and one in the afternoon 08/16/22   Rubie Maid, MD  nitrofurantoin, macrocrystal-monohydrate, (MACROBID) 100 MG capsule Take 1 capsule (100 mg total) by mouth 2 (two) times daily. Patient not taking: Reported on 12/11/2022 10/30/22   Harlin Heys, MD  nitrofurantoin, macrocrystal-monohydrate, (MACROBID) 100 MG capsule Take 1 capsule (100 mg total) by mouth 2 (two) times daily. 12/11/22   Harlin Heys, MD  Prenat-FeCbn-FeAsp-Meth-FA-DHA (PRENATE MINI) 18-0.6-0.4-350 MG CAPS Take 1 capsule daily. 08/15/22   Rubie Maid, MD      PHYSICAL EXAMINATION:   VITAL SIGNS: Last menstrual period 06/03/2022, not currently breastfeeding.  GENERAL:  26 y.o.-year-old patient no acute distress.  HEENT: Head atraumatic, normocephalic. Oropharynx and nasopharynx clear. MP 1, TM distance >3 cm, normal mouth opening. LUNGS: No use of accessory muscles of respiration.   EXTREMITIES: No pedal edema, cyanosis, or clubbing.  NEUROLOGIC: normal gait PSYCHIATRIC: The patient is alert and oriented x 3.  SKIN: No obvious rash, lesion, or ulcer.    IMPRESSION AND PLAN:   Dominique Sanders  is a 27 y.o. female presenting with obesity during pregnancy. BMI is currently 57.2 at 25 5/[redacted] weeks gestation.   We discussed analgesic options during labor including epidural analgesia. Discussed that in obesity there can be increased difficulty with epidural placement or even failure of successful epidural. We also discussed that even  after successful epidural placement there is increased risk of catheter migration out of the epidural space that would require catheter replacement. Discussed use of epidural vs spinal vs GA if cesarean delivery is required. Discussed increased risk of difficult intubation during pregnancy should an emergency cesarean  delivery be required.   We discussed repeat evaluation at 35-36 weeks by anesthesia to determine whether there is a high risk of complications of anesthesia for which we would recommend transfer of OB care to a facility with a higher maternal level of care designation.  Patient understands and wishes to be reevaluated at 35-36 weeks.

## 2023-01-09 ENCOUNTER — Other Ambulatory Visit: Payer: Medicaid Other

## 2023-01-09 ENCOUNTER — Encounter: Payer: Medicaid Other | Admitting: Certified Nurse Midwife

## 2023-01-09 NOTE — Telephone Encounter (Signed)
Patient called office and was rescheduled

## 2023-01-14 ENCOUNTER — Ambulatory Visit (INDEPENDENT_AMBULATORY_CARE_PROVIDER_SITE_OTHER): Payer: Medicaid Other | Admitting: Advanced Practice Midwife

## 2023-01-14 VITALS — BP 122/77 | HR 101 | Wt 316.4 lb

## 2023-01-14 DIAGNOSIS — Z3A28 28 weeks gestation of pregnancy: Secondary | ICD-10-CM

## 2023-01-14 DIAGNOSIS — Z130 Encounter for screening for diseases of the blood and blood-forming organs and certain disorders involving the immune mechanism: Secondary | ICD-10-CM

## 2023-01-14 DIAGNOSIS — Z369 Encounter for antenatal screening, unspecified: Secondary | ICD-10-CM

## 2023-01-14 DIAGNOSIS — O99210 Obesity complicating pregnancy, unspecified trimester: Secondary | ICD-10-CM

## 2023-01-14 DIAGNOSIS — Z3483 Encounter for supervision of other normal pregnancy, third trimester: Secondary | ICD-10-CM

## 2023-01-14 DIAGNOSIS — Z113 Encounter for screening for infections with a predominantly sexual mode of transmission: Secondary | ICD-10-CM

## 2023-01-14 DIAGNOSIS — Z23 Encounter for immunization: Secondary | ICD-10-CM

## 2023-01-14 DIAGNOSIS — O0993 Supervision of high risk pregnancy, unspecified, third trimester: Secondary | ICD-10-CM

## 2023-01-14 DIAGNOSIS — Z131 Encounter for screening for diabetes mellitus: Secondary | ICD-10-CM

## 2023-01-14 NOTE — Progress Notes (Signed)
Routine Prenatal Care Visit  Subjective  Dominique Sanders is a 27 y.o. G2P1001 at [redacted]w[redacted]d being seen today for ongoing prenatal care.  She is currently monitored for the following issues for this high-risk pregnancy and has Obesity in pregnancy; BMI 50.0-59.9, adult ([place]); History of gestational diabetes; History of pre-eclampsia; Supervision of high-risk pregnancy; and Trichomonal vaginitis during pregnancy in first trimester on their problem list.  ----------------------------------------------------------------------------------- Patient reports she is doing well. She was seen by anesthesia and was told that if her BMI reaches 60 she will need to transfer care to [facility]. She will schedule 28 week lab only visit asap as it was not ordered at her last visit.  She has questions regarding gdm, possible induction. Will continue to assess as pregnancy progresses. Contractions: Not present. Vag. Bleeding: None.  Movement: Present. Leaking Fluid denies.  ----------------------------------------------------------------------------------- The following portions of the patient's history were reviewed and updated as appropriate: allergies, current medications, past family history, past medical history, past social history, past surgical history and problem list. Problem list updated.  Objective  Blood pressure 122/77, pulse (!) 101, weight (!) 316 lb 6.4 oz (143.5 kg), last menstrual period 06/03/2022 Pregravid weight 292 lb (132.5 kg) Total Weight Gain 24 lb 6.4 oz (11.1 kg) Urinalysis: Urine Protein    Urine Glucose    Fetal Status: Fetal Heart Rate (bpm): 141   Movement: Present     General:  Alert, oriented and cooperative. Patient is in no acute distress.  Skin: Skin is warm and dry. No rash noted.   Cardiovascular: Normal heart rate noted  Respiratory: Normal respiratory effort, no problems with respiration noted  Abdomen: Soft, gravid, appropriate for gestational age. Pain/Pressure: Absent      Pelvic:  Cervical exam deferred        Extremities: Normal range of motion.  Edema: None  Mental Status: Normal mood and affect. Normal behavior. Normal judgment and thought content.   Assessment   27 y.o. G2P1001 at [redacted]w[redacted]d by  04/08/2023, by Ultrasound presenting for routine prenatal visit  Plan   G2 Problems (from 08/03/22 to present)     Problem Noted Resolved   Supervision of high-risk pregnancy 09/06/2022 by Rubie Maid, MD No   Overview Addendum 10/01/2022 10:56 AM by Chilton Greathouse, [place] Staff Provider  Office Location  [facility] Dating  04/08/2023, by Ultrasound  Language  English Anatomy US    Flu Vaccine   Genetic Screen  NIPS: Neg/Female(08/25/22)  TDaP vaccine    Hgb A1C or  GTT Early : 5.8 Third trimester :   Covid    LAB RESULTS   Rhogam  O/Positive/-- (08/21 1444)  Blood Type O/Positive/-- (08/21 1444)   Feeding Plan Breast Antibody Negative (08/21 1444)  Contraception  Rubella 5.59 (08/21 1444)  Circumcision  RPR Non Reactive (08/21 1444)   Pediatrician   HBsAg Negative (08/21 1444)   Support Person  HIV Non Reactive (08/21 1444)  Prenatal Classes  Varicella Immune (08/21 1444)    GBS  (For PCN allergy, check sensitivities)   BTL Consent  Hep C Non Reactive (08/21 1444)   VBAC Consent  Pap Diagnosis  Date Value Ref Range Status  08/11/2019   Final   -  NEGATIVE FOR INTRAEPITHELIAL LESIONS OR MALIGNANCY  08/11/2019 -  BENIGN REACTIVE/REPARATIVE CHANGES  Final  08/11/2019   Final   -  FUNGAL ORGANISMS PRESENT CONSISTENT WITH CANDIDA SPP.      Hgb Electro  CF      SMA                   Preterm labor symptoms and general obstetric precautions including but not limited to vaginal bleeding, contractions, leaking of fluid and fetal movement were reviewed in detail with the patient. Please refer to After Visit Summary for other counseling recommendations.   Return for NEEDS lab only ASAP for 28w labs and rob in 2 weeks.  Rod Can, CNM 01/14/2023 1:50 PM

## 2023-01-15 ENCOUNTER — Encounter: Payer: Self-pay | Admitting: Advanced Practice Midwife

## 2023-01-15 ENCOUNTER — Ambulatory Visit: Admission: RE | Admit: 2023-01-15 | Payer: Medicaid Other | Source: Ambulatory Visit

## 2023-01-22 ENCOUNTER — Ambulatory Visit
Admission: RE | Admit: 2023-01-22 | Discharge: 2023-01-22 | Disposition: A | Discharge status: Home / Self Care | Payer: Medicaid Other | Source: Ambulatory Visit | Attending: Obstetrics and Gynecology | Admitting: Obstetrics and Gynecology

## 2023-01-22 DIAGNOSIS — Z3A23 23 weeks gestation of pregnancy: Secondary | ICD-10-CM | POA: Diagnosis present

## 2023-01-22 DIAGNOSIS — O0992 Supervision of high risk pregnancy, unspecified, second trimester: Secondary | ICD-10-CM | POA: Insufficient documentation

## 2023-01-22 DIAGNOSIS — Z3689 Encounter for other specified antenatal screening: Secondary | ICD-10-CM | POA: Diagnosis present

## 2023-01-24 ENCOUNTER — Encounter: Payer: Self-pay | Admitting: Obstetrics and Gynecology

## 2023-01-28 ENCOUNTER — Ambulatory Visit (INDEPENDENT_AMBULATORY_CARE_PROVIDER_SITE_OTHER): Payer: Medicaid Other | Admitting: Obstetrics

## 2023-01-28 ENCOUNTER — Other Ambulatory Visit: Payer: Medicaid Other

## 2023-01-28 VITALS — BP 121/77 | HR 94 | Wt 319.3 lb

## 2023-01-28 DIAGNOSIS — O26849 Uterine size-date discrepancy, unspecified trimester: Secondary | ICD-10-CM

## 2023-01-28 DIAGNOSIS — O0993 Supervision of high risk pregnancy, unspecified, third trimester: Secondary | ICD-10-CM

## 2023-01-28 DIAGNOSIS — Z3A30 30 weeks gestation of pregnancy: Secondary | ICD-10-CM

## 2023-01-28 DIAGNOSIS — Z113 Encounter for screening for infections with a predominantly sexual mode of transmission: Secondary | ICD-10-CM

## 2023-01-28 DIAGNOSIS — O99210 Obesity complicating pregnancy, unspecified trimester: Secondary | ICD-10-CM

## 2023-01-28 DIAGNOSIS — Z131 Encounter for screening for diabetes mellitus: Secondary | ICD-10-CM

## 2023-01-28 DIAGNOSIS — R030 Elevated blood-pressure reading, without diagnosis of hypertension: Secondary | ICD-10-CM

## 2023-01-28 DIAGNOSIS — Z23 Encounter for immunization: Secondary | ICD-10-CM

## 2023-01-28 DIAGNOSIS — Z3A28 28 weeks gestation of pregnancy: Secondary | ICD-10-CM

## 2023-01-28 DIAGNOSIS — Z130 Encounter for screening for diseases of the blood and blood-forming organs and certain disorders involving the immune mechanism: Secondary | ICD-10-CM

## 2023-01-28 DIAGNOSIS — Z369 Encounter for antenatal screening, unspecified: Secondary | ICD-10-CM

## 2023-01-28 LAB — POCT URINALYSIS DIPSTICK OB
Bilirubin, UA: NEGATIVE
Blood, UA: POSITIVE
Glucose, UA: NEGATIVE
Ketones, UA: NEGATIVE
Nitrite, UA: NEGATIVE
POC,PROTEIN,UA: NEGATIVE
Spec Grav, UA: 1.015 (ref 1.010–1.025)
Urobilinogen, UA: 1 E.U./dL
pH, UA: 6 (ref 5.0–8.0)

## 2023-01-28 NOTE — Progress Notes (Signed)
ROB at [redacted]w[redacted]d. Active baby. Jezlyn denies ctx, LOF, and vaginal bleeding. Glorianne states that at her anesthesia consult, she was told she may need to transfer to a tertiary care center if her BMI>60. We discussed the rationale for this, as well as lifestyle modifications to minimize weight gain in the 3rd trimester. Initial BP elevated today; repeat WNL. She reports mild headache that resolves. Baseline preeclampsia labs ordered. 1-hour glucose, RPR, CBC done today. Growth [place] w/MFM ordered. RTC in 2 weeks.  Lurlean Horns, CNM

## 2023-01-29 ENCOUNTER — Encounter: Payer: Self-pay | Admitting: Obstetrics

## 2023-01-29 LAB — 28 WEEK RH+PANEL
Basophils Absolute: 0 10*3/uL (ref 0.0–0.2)
Basos: 0 %
EOS (ABSOLUTE): 0 10*3/uL (ref 0.0–0.4)
Eos: 0 %
Gestational Diabetes Screen: 281 mg/dL — ABNORMAL HIGH (ref 70–139)
HIV Screen 4th Generation wRfx: NONREACTIVE
Hematocrit: 32.7 % — ABNORMAL LOW (ref 34.0–46.6)
Hemoglobin: 10.7 g/dL — ABNORMAL LOW (ref 11.1–15.9)
Immature Grans (Abs): 0 10*3/uL (ref 0.0–0.1)
Immature Granulocytes: 1 %
Lymphocytes Absolute: 1.4 10*3/uL (ref 0.7–3.1)
Lymphs: 17 %
MCH: 28.7 pg (ref 26.6–33.0)
MCHC: 32.7 g/dL (ref 31.5–35.7)
MCV: 88 fL (ref 79–97)
Monocytes Absolute: 0.5 10*3/uL (ref 0.1–0.9)
Monocytes: 6 %
Neutrophils Absolute: 6.3 10*3/uL (ref 1.4–7.0)
Neutrophils: 76 %
Platelets: 226 10*3/uL (ref 150–450)
RBC: 3.73 x10E6/uL — ABNORMAL LOW (ref 3.77–5.28)
RDW: 13.1 % (ref 11.7–15.4)
RPR Ser Ql: NONREACTIVE
WBC: 8.2 10*3/uL (ref 3.4–10.8)

## 2023-01-30 ENCOUNTER — Other Ambulatory Visit: Payer: Self-pay | Admitting: Advanced Practice Midwife

## 2023-01-30 ENCOUNTER — Encounter: Payer: Self-pay | Admitting: Advanced Practice Midwife

## 2023-01-30 DIAGNOSIS — O24419 Gestational diabetes mellitus in pregnancy, unspecified control: Secondary | ICD-10-CM

## 2023-01-30 LAB — URINE CULTURE

## 2023-01-30 NOTE — Progress Notes (Signed)
Referral to lifestyles sent. Message to patient regarding gdm.

## 2023-01-31 LAB — PROTEIN / CREATININE RATIO, URINE
Creatinine, Urine: 94.6 mg/dL
Protein, Ur: 36.4 mg/dL
Protein/Creat Ratio: 385 mg/g creat — ABNORMAL HIGH (ref 0–200)

## 2023-01-31 NOTE — Telephone Encounter (Signed)
Can you prescribe this since Opal Sidles is not here? Im assuming she needs meter, strips and lancets.

## 2023-02-02 ENCOUNTER — Encounter: Payer: Self-pay | Admitting: Obstetrics

## 2023-02-02 ENCOUNTER — Other Ambulatory Visit: Payer: Self-pay | Admitting: Obstetrics

## 2023-02-02 DIAGNOSIS — O163 Unspecified maternal hypertension, third trimester: Secondary | ICD-10-CM

## 2023-02-02 MED ORDER — LANCET DEVICE MISC: 1.0000 | Freq: Three times a day (TID) | 0 refills | Status: DC

## 2023-02-02 MED ORDER — BLOOD GLUCOSE MONITORING SUPPL DEVI: 1.0000 | Freq: Three times a day (TID) | 0 refills | Status: DC

## 2023-02-02 MED ORDER — BLOOD GLUCOSE TEST VI STRP: 1.0000 | ORAL_STRIP | Freq: Three times a day (TID) | 0 refills | Status: DC

## 2023-02-02 MED ORDER — GLYBURIDE 5 MG PO TABS: 5.0000 mg | ORAL_TABLET | Freq: Every day | ORAL | 2 refills | Status: DC

## 2023-02-02 MED ORDER — LANCETS MISC. MISC: 1.0000 | Freq: Three times a day (TID) | 0 refills | Status: DC

## 2023-02-02 NOTE — Progress Notes (Signed)
Reviewed labs and 1-hour glucose results with Dr. Amalia Hailey. Will start glyburide. Repeat pre-e labs at next visit. Sada notified via [place].  Lurlean Horns, CNM

## 2023-02-04 ENCOUNTER — Other Ambulatory Visit: Payer: Self-pay

## 2023-02-04 DIAGNOSIS — Z6841 Body Mass Index (BMI) 40.0 and over, adult: Secondary | ICD-10-CM

## 2023-02-04 NOTE — Progress Notes (Signed)
Repeat anesthesia consult ordered for [redacted] weeks gestation ~02/25/23

## 2023-02-06 ENCOUNTER — Ambulatory Visit (INDEPENDENT_AMBULATORY_CARE_PROVIDER_SITE_OTHER): Payer: Medicaid Other | Admitting: Obstetrics

## 2023-02-06 ENCOUNTER — Encounter: Payer: Self-pay | Admitting: Obstetrics

## 2023-02-06 VITALS — BP 111/75 | HR 83 | Wt 316.6 lb

## 2023-02-06 DIAGNOSIS — R399 Unspecified symptoms and signs involving the genitourinary system: Secondary | ICD-10-CM

## 2023-02-06 DIAGNOSIS — Z3A31 31 weeks gestation of pregnancy: Secondary | ICD-10-CM

## 2023-02-06 DIAGNOSIS — O163 Unspecified maternal hypertension, third trimester: Secondary | ICD-10-CM

## 2023-02-06 DIAGNOSIS — Z23 Encounter for immunization: Secondary | ICD-10-CM | POA: Diagnosis not present

## 2023-02-06 DIAGNOSIS — O0993 Supervision of high risk pregnancy, unspecified, third trimester: Secondary | ICD-10-CM

## 2023-02-06 LAB — POCT URINALYSIS DIPSTICK OB
Bilirubin, UA: NEGATIVE
Glucose, UA: NEGATIVE
Ketones, UA: NEGATIVE
Nitrite, UA: NEGATIVE
Spec Grav, UA: 1.025 (ref 1.010–1.025)
Urobilinogen, UA: 0.2 E.U./dL
pH, UA: 7 (ref 5.0–8.0)

## 2023-02-06 NOTE — Progress Notes (Signed)
Routine Prenatal Care Visit  Subjective  Dominique Sanders is a 27 y.o. G2P1001 at 40w2dbeing seen today for ongoing prenatal care.  She is currently monitored for the following issues for this high-risk pregnancy and has Obesity in pregnancy; BMI 50.0-59.9, [place]; History of gestational diabetes; History of pre-eclampsia; Supervision of high-risk pregnancy; and Trichomonal vaginitis during pregnancy in first trimester on their problem list.  ----------------------------------------------------------------------------------- Patient reports occasional contractions and also shares that she has used her grandmother's glucose monitor although somewhat inconsistently .  She would like to have care with Dr. CMarcelline Mates She is due for another anesthesia meeting, and admits to not keeping her appt at [facility]. Contractions: Irritability. Vag. Bleeding: None.  Movement: Present. Leaking Fluid denies.  ----------------------------------------------------------------------------------- The following portions of the patient's history were reviewed and updated as appropriate: allergies, current medications, past family history, past medical history, past social history, past surgical history and problem list. Problem list updated.  Objective  Blood pressure 111/75, pulse 83, weight (!) 316 lb 9.6 oz (143.6 kg), last menstrual period 06/03/2022. Pregravid weight 292 lb (132.5 kg) Total Weight Gain 24 lb 9.6 oz (11.2 kg) Urinalysis: Urine Protein Moderate (2+)  Urine Glucose Negative  Fetal Status: Fetal Heart Rate (bpm): 145   Movement: Present     General:  Alert, oriented and cooperative. Patient is in no acute distress.  Skin: Skin is warm and dry. No rash noted.   Cardiovascular: Normal heart rate noted  Respiratory: Normal respiratory effort, no problems with respiration noted  Abdomen: Soft, gravid, appropriate for gestational age. Pain/Pressure: Present     Pelvic:  Cervical exam deferred         Extremities: Normal range of motion.  Edema: Trace  Mental Status: Normal mood and affect. Normal behavior. Normal judgment and thought content.   Assessment   27y.o. G2P1001 at 333w2dy  04/08/2023, by Ultrasound presenting for routine prenatal visit  Plan   G2 Problems (from 08/03/22 to present)     Problem Noted Resolved   Supervision of high-risk pregnancy 09/06/2022 by ChRubie MaidMD No   Overview Addendum 02/06/2023  1:49 PM by GoLandis GandyCMVenturataff Provider  Office Location  [facility] Dating  04/08/2023, by Ultrasound  Language  English Anatomy [place]  Flu Vaccine  02/06/2022 Genetic Screen  NIPS: Neg/Female(08/25/22)  TDaP vaccine   Per pt she has had tdap-no documentation  Hgb A1C or  GTT Early : 5.8 Third trimester :   Covid 2022   LAB RESULTS   Rhogam  O/Positive/-- (08/21 1444)  Blood Type O/Positive/-- (08/21 1444)   Feeding Plan Breast Antibody Negative (08/21 1444)  Contraception nexplanon Rubella 5.59 (08/21 1444)  Circumcision na RPR Non Reactive (08/21 1444)   Pediatrician  unsure HBsAg Negative (08/21 1444)   Support Person Mon and BF HIV Non Reactive (08/21 1444)  Prenatal Classes na Varicella Immune (08/21 1444)    GBS  (For PCN allergy, check sensitivities)   BTL Consent  Hep C Non Reactive (08/21 1444)   VBAC Consent  Pap Diagnosis  Date Value Ref Range Status  08/11/2019   Final   -  NEGATIVE FOR INTRAEPITHELIAL LESIONS OR MALIGNANCY  08/11/2019 -  BENIGN REACTIVE/REPARATIVE CHANGES  Final  08/11/2019   Final   -  FUNGAL ORGANISMS PRESENT CONSISTENT WITH CANDIDA SPP.      Hgb Electro      CF      SMA  Preterm labor symptoms and general obstetric precautions including but not limited to vaginal bleeding, contractions, leaking of fluid and fetal movement were reviewed in detail with the patient. Please refer to After Visit Summary for other counseling recommendations.  Careful review of her risk  factors and the importance of getting on track with glucose testing, dietary changes, and watching weight gain so that she can remain with [facility] for delivery. [place] labs drawn again today, as she had elevated UPC on 01/28/2023. I believe she would benefit from MFM consultation- she does have growth scans scheduled.  No follow-ups on file.  Imagene Riches, CNM  02/06/2023 5:23 PM

## 2023-02-06 NOTE — Progress Notes (Signed)
ROB. Patient states she is unsure if she is having contractions but has felt cramps and pain on right side of stomach, she was unable to get glucose monitor as it was expensive, she has been checking sugars with her grandmother monitor but is unable to recall what they have been.Patient states she received the TDAP vaccine. .Patient states no other questions or concerns at this time.

## 2023-02-07 ENCOUNTER — Encounter: Payer: Self-pay | Admitting: Obstetrics and Gynecology

## 2023-02-07 LAB — COMPREHENSIVE METABOLIC PANEL
ALT: 9 IU/L (ref 0–32)
AST: 13 IU/L (ref 0–40)
Albumin/Globulin Ratio: 1.4 (ref 1.2–2.2)
Albumin: 3.9 g/dL — ABNORMAL LOW (ref 4.0–5.0)
Alkaline Phosphatase: 84 IU/L (ref 44–121)
BUN/Creatinine Ratio: 20 (ref 9–23)
BUN: 11 mg/dL (ref 6–20)
Bilirubin Total: <0.2 mg/dL (ref 0.0–1.2)
CO2: 20 mmol/L (ref 20–29)
Calcium: 9.7 mg/dL (ref 8.7–10.2)
Chloride: 100 mmol/L (ref 96–106)
Creatinine, Ser: 0.56 mg/dL — ABNORMAL LOW (ref 0.57–1.00)
Globulin, Total: 2.8 g/dL (ref 1.5–4.5)
Glucose: 149 mg/dL — ABNORMAL HIGH (ref 70–99)
Potassium: 4.6 mmol/L (ref 3.5–5.2)
Sodium: 135 mmol/L (ref 134–144)
Total Protein: 6.7 g/dL (ref 6.0–8.5)
eGFR: 129 mL/min/{1.73_m2} (ref >59)

## 2023-02-07 LAB — CBC
Hematocrit: 37.2 % (ref 34.0–46.6)
Hemoglobin: 12.7 g/dL (ref 11.1–15.9)
MCH: 28.5 pg (ref 26.6–33.0)
MCHC: 34.1 g/dL (ref 31.5–35.7)
MCV: 84 fL (ref 79–97)
Platelets: 262 10*3/uL (ref 150–450)
RBC: 4.45 x10E6/uL (ref 3.77–5.28)
RDW: 13 % (ref 11.7–15.4)
WBC: 8.7 10*3/uL (ref 3.4–10.8)

## 2023-02-07 LAB — PROTEIN / CREATININE RATIO, URINE
Creatinine, Urine: 158.6 mg/dL
Protein, Ur: 71.1 mg/dL
Protein/Creat Ratio: 448 mg/g creat — ABNORMAL HIGH (ref 0–200)

## 2023-02-07 NOTE — Addendum Note (Signed)
Addended by: Meryl Dare on: 02/07/2023 11:56 AM   Modules accepted: Orders

## 2023-02-08 ENCOUNTER — Encounter: Payer: Self-pay | Admitting: Obstetrics

## 2023-02-10 LAB — URINE CULTURE

## 2023-02-11 ENCOUNTER — Telehealth: Payer: Self-pay | Admitting: Obstetrics and Gynecology

## 2023-02-11 NOTE — Telephone Encounter (Signed)
Reached out to pt to let her know that she needs to be seen during the week of Feb. 19th.  I scheduled her on 02/13/2023 with Dr. Marcelline Mates at 9:15 with an NST at 8:45.  Left message for pt to call back to confirm.

## 2023-02-12 NOTE — Telephone Encounter (Signed)
Spoke with pt and she was not able to come to the appts on 02/13/2022 bc of transportation.  She is now scheduled for 02/15/2023 at 3:15 for the NST and 4:15 ROB appt with Dr. Marcelline Mates.

## 2023-02-13 ENCOUNTER — Other Ambulatory Visit: Payer: Medicaid Other

## 2023-02-13 ENCOUNTER — Encounter: Payer: Medicaid Other | Admitting: Obstetrics and Gynecology

## 2023-02-14 ENCOUNTER — Other Ambulatory Visit: Payer: Medicaid Other

## 2023-02-14 DIAGNOSIS — Z3A32 32 weeks gestation of pregnancy: Secondary | ICD-10-CM | POA: Insufficient documentation

## 2023-02-14 DIAGNOSIS — O24419 Gestational diabetes mellitus in pregnancy, unspecified control: Secondary | ICD-10-CM | POA: Insufficient documentation

## 2023-02-14 DIAGNOSIS — O163 Unspecified maternal hypertension, third trimester: Secondary | ICD-10-CM | POA: Insufficient documentation

## 2023-02-14 NOTE — Patient Instructions (Incomplete)

## 2023-02-15 ENCOUNTER — Encounter: Payer: Medicaid Other | Admitting: Obstetrics and Gynecology

## 2023-02-15 ENCOUNTER — Other Ambulatory Visit: Payer: Medicaid Other

## 2023-02-15 DIAGNOSIS — O99210 Obesity complicating pregnancy, unspecified trimester: Secondary | ICD-10-CM

## 2023-02-15 DIAGNOSIS — O0993 Supervision of high risk pregnancy, unspecified, third trimester: Secondary | ICD-10-CM

## 2023-02-15 DIAGNOSIS — O24419 Gestational diabetes mellitus in pregnancy, unspecified control: Secondary | ICD-10-CM

## 2023-02-15 DIAGNOSIS — O163 Unspecified maternal hypertension, third trimester: Secondary | ICD-10-CM

## 2023-02-15 DIAGNOSIS — Z3A32 32 weeks gestation of pregnancy: Secondary | ICD-10-CM

## 2023-02-15 NOTE — Progress Notes (Deleted)
    NURSE VISIT NOTE  Subjective:    Patient ID: Dominique Sanders, female    DOB: 06-06-1996, 27 y.o.   MRN: [id number]  HPI  Patient is a 27 y.o. G53P1001 female who presents for fetal monitoring per order from Lloyd Huger, CNM.   Objective:    LMP 06/03/2022 (Exact Date)  Estimated Date of Delivery: 04/08/23  Assessment:   1. Supervision of high risk pregnancy in third trimester   2. Elevated blood pressure affecting pregnancy in third trimester, antepartum   3. Gestational diabetes mellitus (GDM) in third trimester, gestational diabetes method of control unspecified   4. Obesity in pregnancy   5. [redacted] weeks gestation of pregnancy      Plan:   Results reviewed and discussed with patient by  Rubie Maid, MD.     Otila Kluver, LPN

## 2023-02-19 ENCOUNTER — Other Ambulatory Visit: Payer: Self-pay | Admitting: Obstetrics

## 2023-02-19 DIAGNOSIS — O24419 Gestational diabetes mellitus in pregnancy, unspecified control: Secondary | ICD-10-CM

## 2023-02-19 MED ORDER — ACCU-CHEK SOFTCLIX LANCETS MISC: 6 refills | Status: DC

## 2023-02-19 MED ORDER — ACCU-CHEK GUIDE VI STRP: ORAL_STRIP | 12 refills | Status: DC

## 2023-02-19 MED ORDER — ACCU-CHEK GUIDE W/DEVICE KIT: 1.0000 | PACK | 0 refills | Status: DC

## 2023-02-19 NOTE — Progress Notes (Signed)
This patient has recently reached out asking for a glucose meter. She was diagnosed with GDM many weeks ago.At earlier appointments, she shared that she was using her grandmother's meter. She declines referral to [facility] ( " I have been there before, and I know what to do"), but has also not been checking her glucose levels. Just ordered her the Accuchek meter and supples today at [redacted] weeks gestation. Imagene Riches, CNM  02/19/2023 11:52 AM

## 2023-02-20 ENCOUNTER — Encounter: Payer: Self-pay | Admitting: Obstetrics and Gynecology

## 2023-02-20 ENCOUNTER — Other Ambulatory Visit: Payer: Self-pay | Admitting: Obstetrics and Gynecology

## 2023-02-20 ENCOUNTER — Ambulatory Visit (INDEPENDENT_AMBULATORY_CARE_PROVIDER_SITE_OTHER): Payer: Medicaid Other | Admitting: Obstetrics and Gynecology

## 2023-02-20 VITALS — BP 99/84 | Wt 319.0 lb

## 2023-02-20 DIAGNOSIS — O24415 Gestational diabetes mellitus in pregnancy, controlled by oral hypoglycemic drugs: Secondary | ICD-10-CM

## 2023-02-20 DIAGNOSIS — R82998 Other abnormal findings in urine: Secondary | ICD-10-CM

## 2023-02-20 DIAGNOSIS — O99210 Obesity complicating pregnancy, unspecified trimester: Secondary | ICD-10-CM

## 2023-02-20 DIAGNOSIS — O1403 Mild to moderate pre-eclampsia, third trimester: Secondary | ICD-10-CM

## 2023-02-20 DIAGNOSIS — Z3A33 33 weeks gestation of pregnancy: Secondary | ICD-10-CM

## 2023-02-20 DIAGNOSIS — O0993 Supervision of high risk pregnancy, unspecified, third trimester: Secondary | ICD-10-CM

## 2023-02-20 LAB — POCT URINALYSIS DIPSTICK OB
Bilirubin, UA: NEGATIVE
Glucose, UA: NEGATIVE
Nitrite, UA: NEGATIVE
Spec Grav, UA: 1.015 (ref 1.010–1.025)
Urobilinogen, UA: 0.2 E.U./dL
pH, UA: 6.5 (ref 5.0–8.0)

## 2023-02-20 MED ORDER — DEXCOM G5 MOBILE TRANSMITTER MISC: 1.0000 | Freq: Four times a day (QID) | 0 refills | Status: DC

## 2023-02-20 MED ORDER — DEXCOM G5 RECEIVER KIT DEVI: 1.0000 | Freq: Once | 0 refills | Status: AC

## 2023-02-20 MED ORDER — DEXCOM G5 MOB/G4 PLAT SENSOR MISC: 1.0000 | 3 refills | Status: DC

## 2023-02-20 NOTE — Progress Notes (Signed)
ROB: Patient is a 27 y.o. G2P1001 at 43w2dwho presents for high risk OB care.  Patient with gestational diabetes currently uncontrolled as she does not check her blood sugars regularly due to not liking to have to prick herself.  Patient does have a history of gestational diabetes in her previous pregnancy.  I discussed other options for monitoring her blood sugars including use of different monitoring device (currently using her grandmother's Dexcom from time to time).  Discussed that it would be possible to get patient her own Dexcom so that she can monitor her blood sugars more regularly.  Currently notes that when she does check them fastings ranging anywhere between 100-1 20s and postprandials are up to the 140s.  Is currently on glyburide 5 mg in p.m.  I discussed increasing to twice daily dosing.  Patient agrees to this.  Also discussed patient's weight, she [facility]  Notes that she is starting to cut back on her sugar and carbohydrate intake some.  Also trying to improve her overall diet by eating a little healthier.  Also discussed option of exercise such as short 15 to 30-minute walks several times a week.  Patient also with a history of mild preeclampsia this pregnancy, is due for weekly surveillance of her PUintahlabs, ordered.  Is due to begin NSTs, however patient missed her appointment last week and unable to stay this week due to transportation issues.  She does however have an ultrasound scheduled for tomorrow and can likely have a BPP performed with her growth scan.  I have discussed patient's pregnancy complications in detail and advised on the likelihood of requiring earlier delivery due to her uncontrolled diabetes as well as her mild preeclampsia.  Most likely will require delivery sometime between 37 to 38 weeks unless her preeclampsia worsens.  Patient notes understanding, states that she would be happy with an earlier delivery as she feels that  this baby is much larger than her prior.  To follow-up in 1 week to reassess blood sugars.  Advised that she will likely need weekly appointments with antenatal testing from here until delivery.  Patient has a birth plan, reviewed this plan with her, scanned copy to her chart.

## 2023-02-21 ENCOUNTER — Ambulatory Visit: Payer: Medicaid Other | Attending: Obstetrics

## 2023-02-21 ENCOUNTER — Telehealth: Payer: Self-pay

## 2023-02-21 ENCOUNTER — Other Ambulatory Visit: Payer: Self-pay

## 2023-02-21 ENCOUNTER — Other Ambulatory Visit: Payer: Self-pay | Admitting: Obstetrics

## 2023-02-21 VITALS — BP 132/87 | HR 98 | Temp 98.6°F | Ht 62.0 in | Wt 320.0 lb

## 2023-02-21 DIAGNOSIS — Z3A33 33 weeks gestation of pregnancy: Secondary | ICD-10-CM | POA: Diagnosis not present

## 2023-02-21 DIAGNOSIS — O26849 Uterine size-date discrepancy, unspecified trimester: Secondary | ICD-10-CM

## 2023-02-21 DIAGNOSIS — E669 Obesity, unspecified: Secondary | ICD-10-CM

## 2023-02-21 DIAGNOSIS — Z363 Encounter for antenatal screening for malformations: Secondary | ICD-10-CM | POA: Diagnosis present

## 2023-02-21 DIAGNOSIS — O24419 Gestational diabetes mellitus in pregnancy, unspecified control: Secondary | ICD-10-CM | POA: Diagnosis not present

## 2023-02-21 DIAGNOSIS — O99213 Obesity complicating pregnancy, third trimester: Secondary | ICD-10-CM | POA: Diagnosis not present

## 2023-02-21 DIAGNOSIS — O24415 Gestational diabetes mellitus in pregnancy, controlled by oral hypoglycemic drugs: Secondary | ICD-10-CM

## 2023-02-21 DIAGNOSIS — O1493 Unspecified pre-eclampsia, third trimester: Secondary | ICD-10-CM | POA: Insufficient documentation

## 2023-02-21 DIAGNOSIS — O0993 Supervision of high risk pregnancy, unspecified, third trimester: Secondary | ICD-10-CM

## 2023-02-21 LAB — COMPREHENSIVE METABOLIC PANEL
ALT: 14 IU/L (ref 0–32)
AST: 13 IU/L (ref 0–40)
Albumin/Globulin Ratio: 1.2 (ref 1.2–2.2)
Albumin: 3.6 g/dL — ABNORMAL LOW (ref 4.0–5.0)
Alkaline Phosphatase: 88 IU/L (ref 44–121)
BUN/Creatinine Ratio: 13 (ref 9–23)
BUN: 9 mg/dL (ref 6–20)
Bilirubin Total: 0.2 mg/dL (ref 0.0–1.2)
CO2: 19 mmol/L — ABNORMAL LOW (ref 20–29)
Calcium: 9.8 mg/dL (ref 8.7–10.2)
Chloride: 100 mmol/L (ref 96–106)
Creatinine, Ser: 0.72 mg/dL (ref 0.57–1.00)
Globulin, Total: 3 g/dL (ref 1.5–4.5)
Glucose: 145 mg/dL — ABNORMAL HIGH (ref 70–99)
Potassium: 4.3 mmol/L (ref 3.5–5.2)
Sodium: 136 mmol/L (ref 134–144)
Total Protein: 6.6 g/dL (ref 6.0–8.5)
eGFR: 118 mL/min/{1.73_m2} (ref >59)

## 2023-02-21 LAB — PROTEIN / CREATININE RATIO, URINE
Creatinine, Urine: 189 mg/dL
Protein, Ur: 92.6 mg/dL
Protein/Creat Ratio: 490 mg/g creat — ABNORMAL HIGH (ref 0–200)

## 2023-02-21 NOTE — Telephone Encounter (Signed)
Request sent from pharmacy for Dexcom 6. approved . Pt aware and to call us back if she needs anything else.

## 2023-02-21 NOTE — Telephone Encounter (Signed)
Pt called triage needing [place] to call pharmacy about the Dexcom glucose supplies, Pt states the pharmacy needs more info from [place].  Called pharmacy and they are not open yet. Will try again shortly.

## 2023-02-23 ENCOUNTER — Encounter: Payer: Self-pay | Admitting: Obstetrics

## 2023-02-23 LAB — URINE CULTURE

## 2023-02-26 ENCOUNTER — Observation Stay
Admission: EM | Admit: 2023-02-26 | Discharge: 2023-02-26 | Disposition: A | Discharge status: Home / Self Care | Payer: Medicaid Other | Attending: Advanced Practice Midwife | Admitting: Advanced Practice Midwife

## 2023-02-26 ENCOUNTER — Other Ambulatory Visit: Payer: Self-pay

## 2023-02-26 ENCOUNTER — Encounter: Payer: Self-pay | Admitting: Obstetrics and Gynecology

## 2023-02-26 DIAGNOSIS — Z7982 Long term (current) use of aspirin: Secondary | ICD-10-CM | POA: Diagnosis not present

## 2023-02-26 DIAGNOSIS — O4693 Antepartum hemorrhage, unspecified, third trimester: Secondary | ICD-10-CM | POA: Diagnosis not present

## 2023-02-26 DIAGNOSIS — Z3A34 34 weeks gestation of pregnancy: Secondary | ICD-10-CM | POA: Diagnosis not present

## 2023-02-26 DIAGNOSIS — R7309 Other abnormal glucose: Secondary | ICD-10-CM | POA: Insufficient documentation

## 2023-02-26 DIAGNOSIS — O0993 Supervision of high risk pregnancy, unspecified, third trimester: Secondary | ICD-10-CM

## 2023-02-26 LAB — URINALYSIS, COMPLETE (UACMP) WITH MICROSCOPIC
Bilirubin Urine: NEGATIVE
Glucose, UA: NEGATIVE mg/dL
Ketones, ur: 20 mg/dL — AB
Nitrite: NEGATIVE
Protein, ur: 30 mg/dL — AB
Specific Gravity, Urine: 1.015 (ref 1.005–1.030)
WBC, UA: >50 WBC/hpf (ref 0–5)
pH: 6 (ref 5.0–8.0)

## 2023-02-26 LAB — GLUCOSE, CAPILLARY: Glucose-Capillary: 147 mg/dL — ABNORMAL HIGH (ref 70–99)

## 2023-02-26 NOTE — Progress Notes (Cosign Needed Addendum)
    NURSE VISIT NOTE  Subjective:    Patient ID: Dominique Sanders, female    DOB: September 30, 1996, 27 y.o.   MRN: [id number]  HPI  Patient is a 27 y.o. G59P1001 female who presents for fetal monitoring per order from Rubie Maid, MD.   Objective:    BP 106/76   Pulse 96   Ht 5\' 2"  (1.575 m)   Wt (!) 317 lb 14.4 oz (144.2 kg)   LMP 06/03/2022 (Exact Date)   BMI 58.14 kg/m  Estimated Date of Delivery: 04/08/23  [redacted]w[redacted]d  Fetus A Non-Stress Test Interpretation for 03/01/23  Indication: Gestational Diabetes medication controlled, Obesity, and Mild Pre-eclampsia  Fetal Heart Rate A Mode: External Baseline Rate (A): 145 bpm Variability: Moderate Accelerations: 10 x 10 Decelerations: Variable Multiple birth?: No  Uterine Activity Mode: Toco Contraction Frequency (min): None  Interpretation (Fetal Testing) Nonstress Test Interpretation: Non-reactive   Assessment:   1. Supervision of high risk pregnancy in third trimester   2. Gestational diabetes mellitus (GDM) in third trimester controlled on oral hypoglycemic drug   3. Obesity affecting pregnancy in third trimester, unspecified obesity type   4. [redacted] weeks gestation of pregnancy      Plan:   Results reviewed and discussed with patient by  Rubie Maid, MD. Non Reactive NST. Patient sent to L&D for monitoring and BPP.    Otila Kluver, LPN

## 2023-02-26 NOTE — OB Triage Note (Signed)
Pt presents to L/D triage from home via EMS for vaginal bleeding that was noted while on toilet and with wiping this morning. Pt wearing pad that has not been changed since the episode- small, bright bleeding noted. Visual assessment noted no active bleeding with scant blood on labia. Pt reported pressure on toilet at home but none since. Pt reports positive fetal movement and no gushes of fluid. Occasional mild intermittent abdominal pain noted. Pt reports no recent intercourse and last bowel movement approximately last week. She reports nausea with no episodes of N/V. Pt reports feeling "dehydrated"-PO hydrating. CBG 147- pt diet controlled GDM. VSS. BP 134/80- pt being monitored for pre-e and GDM in pregnancy. Monitors applied and assessing- initial FHT 140.  CNM notified of patient's arrival.

## 2023-02-26 NOTE — Patient Instructions (Signed)

## 2023-02-26 NOTE — OB Triage Note (Signed)
Discharge instructions provided to pt about checking her blood sugars at home and keeping a log, taking her medications at prescribed. PreE red flags reviewed. Pt verbalizes understanding. Vaginal bleeding and discharge, contractions, and fetal movement reviewed by RN. Follow-up care reviewed. Pt discharged home by self.

## 2023-02-26 NOTE — Discharge Summary (Cosign Needed Addendum)
Physician Final Progress Note  Patient ID: Dominique Sanders MRN: [id number] DOB/AGE: Oct 16, 1996 27 y.o.  Admit date: 02/26/2023 Admitting provider: Rod Can, CNM Discharge date: 02/26/2023   Admission Diagnoses:  1) intrauterine pregnancy at [redacted]w[redacted]d 2) vaginal bleeding in third trimester 3) pelvic pressure  Discharge Diagnoses:  Principal Problem:   Vaginal bleeding in pregnancy, third trimester Active Problems:   [redacted] weeks gestation of pregnancy    History of Present Illness: The patient is a 27y.o. female G2P1001 at 365w1dho presents for vaginal bleeding noted while on toilet this morning. She also felt pelvic pressure while sitting on the toilet. She denies rectal bleeding or feeling constipated or straining, however, last BM was about 1 week ago. Last intercourse was about 2 weeks ago. She denies leakage of fluid or contractions. She reports good fetal movement. She denies vaginal itching or irritation. She reports occasional brief pains that she feels in varying abdominal locations.   She was admitted for observation and placed on monitors. Overall reactive NST. Some periods of decreased variability. Patient is encouraged to avoid laying flat on her back. Some irregular uterine activity noted on admission which decreased with PO hydration while in triage. Sterile speculum exam- cervix appears closed, no active cervical bleeding seen. Patient is discharged to home with instructions and precautions. Next office appointment is on Thursday.  Past Medical History:  Diagnosis Date   History of gestational diabetes 10/15/2019   Preeclampsia 01/08/2020   Results for orders placed or performed during the hospital encounter of 01/07/20 (from the past 24 hour(s)) Urinalysis, Routine w reflex microscopic     Status: Abnormal  Collection Time: 01/07/20 11:20 PM Result Value Ref Range  Color, Urine STRAW (A) YELLOW  APPearance CLEAR CLEAR  Specific Gravity, Urine 1.009 1.005 - 1.030  pH 7.0 5.0 - 8.0   Glucose, UA >=500 (A) NEGATIVE mg/dL  Hgb urine dips    Past Surgical History:  Procedure Laterality Date   no surgical       No current facility-administered medications on file prior to encounter.   Current Outpatient Medications on File Prior to Encounter  Medication Sig Dispense Refill   Accu-Chek Softclix Lancets lancets Use as instructed 100 each 6   aspirin EC 81 MG tablet Take 2 tablets (162 mg total) by mouth daily. Swallow whole. 300 tablet 3   glyBURIDE (DIABETA) 5 MG tablet Take 1 tablet (5 mg total) by mouth at bedtime. 30 tablet 2   Blood Glucose Monitoring Suppl (ACCU-CHEK GUIDE) w/Device KIT 1 kit by Does not apply route as directed. (Patient not taking: Reported on 02/20/2023) 1 kit 0   Continuous Blood Gluc Receiver (DEXCOM G6 RECEIVER) DEVI 1 DEVICE BY DOES NOT APPLY ROUTE ONCE A WEEK. (Patient not taking: Reported on 02/26/2023) 4 each 3   Continuous Blood Gluc Transmit (DEXCOM G6 TRANSMITTER) MISC 1 DEVICE BY DOES NOT APPLY ROUTE IN THE MORNING, AT NOON, IN THE EVENING, AND AT BEDTIME. (Patient not taking: Reported on 02/26/2023) 1 each 0   Doxylamine-Pyridoxine (DICLEGIS) 10-10 MG TBEC Take 2 tablets by mouth at bedtime. If symptoms persist, add one tablet in the morning and one in the afternoon (Patient not taking: Reported on 01/28/2023) 100 tablet 5   glucose blood (ACCU-CHEK GUIDE) test strip Use as instructed (Patient not taking: Reported on 02/20/2023) 100 each 12   Prenat-FeCbn-FeAsp-Meth-FA-DHA (PRENATE MINI) 18-0.6-0.4-350 MG CAPS Take 1 capsule daily. (Patient not taking: Reported on 01/28/2023) 30 capsule 11    No Known  Allergies  Social History   Socioeconomic History   Marital status: Single    Spouse name: Orlene Plum   Number of children: 1   Years of education: 12   Highest education level: 12th grade  Occupational History   Not on file  Tobacco Use   Smoking status: Never   Smokeless tobacco: Never  Vaping Use   Vaping Use: Former  Substance  and Sexual Activity   Alcohol use: Not Currently   Drug use: Never   Sexual activity: Not Currently    Comment: planning nexplanon  Other Topics Concern   Not on file  Social History Narrative   Not on file   Social Determinants of Health   Financial Resource Strain: Not on file  Food Insecurity: Not on file  Transportation Needs: Not on file  Physical Activity: Not on file  Stress: Not on file  Social Connections: Not on file  Intimate Partner Violence: Not on file    Family History  Problem Relation Age of Onset   Healthy Mother    Healthy Father      Review of Systems  Constitutional:  Negative for chills and fever.  HENT:  Negative for congestion, ear discharge, ear pain, hearing loss, sinus pain and sore throat.   Eyes:  Negative for blurred vision and double vision.  Respiratory:  Negative for cough, shortness of breath and wheezing.   Cardiovascular:  Negative for chest pain, palpitations and leg swelling.  Gastrointestinal:  Positive for abdominal pain. Negative for blood in stool, constipation, diarrhea, heartburn, melena, nausea and vomiting.  Genitourinary:  Negative for dysuria, flank pain, frequency, hematuria and urgency.       Positive for vaginal bleeding  Musculoskeletal:  Negative for back pain, joint pain and myalgias.  Skin:  Negative for itching and rash.  Neurological:  Negative for dizziness, tingling, tremors, sensory change, speech change, focal weakness, seizures, loss of consciousness, weakness and headaches.  Endo/Heme/Allergies:  Negative for environmental allergies. Does not bruise/bleed easily.  Psychiatric/Behavioral:  Negative for depression, hallucinations, memory loss, substance abuse and suicidal ideas. The patient is not nervous/anxious and does not have insomnia.      Physical Exam: BP 134/80 (BP Location: Right Arm)   Pulse (!) 106   Temp 98.2 F (36.8 C) (Oral)   Resp 16   LMP 06/03/2022 (Exact Date)   Constitutional: obese  female in no acute distress.  HEENT: normal Skin: Warm and dry.  Cardiovascular: Regular rate and rhythm.   Extremity:  trace edema   Respiratory: Clear to auscultation bilateral. Normal respiratory effort Abdomen: FHT present Psych: Alert and Oriented x3. No memory deficits. Normal mood and affect.    Pelvic exam: (female chaperone present) is not limited by body habitus EGBUS: within normal limits Vagina: within normal limits and with normal mucosa, increased thin white discharge with slight pink/tan tinge  Cervix: appears closed, no active bleeding   Toco: irregular, infrequent, 30 seconds, mild to palpation Fetal well being: 135 bpm, moderate variability, +accelerations, -decelerations  Consults: None  Significant Findings/ Diagnostic Studies: labs:   Latest Reference Range & Units 02/26/23 11:23  Appearance CLEAR  HAZY !  Bilirubin Urine NEGATIVE  NEGATIVE  Color, Urine YELLOW  YELLOW !  Glucose, UA NEGATIVE mg/dL NEGATIVE  Hgb urine dipstick NEGATIVE  LARGE !  Ketones, ur NEGATIVE mg/dL 20 !  Leukocytes,Ua NEGATIVE  LARGE !  Nitrite NEGATIVE  NEGATIVE  pH 5.0 - 8.0  6.0  Protein NEGATIVE mg/dL 30 !  Specific Gravity, Urine 1.005 - 1.030  1.015  Bacteria, UA NONE SEEN  FEW !  Mucus  PRESENT  RBC / HPF 0 - 5 RBC/hpf 21-50  Squamous Epithelial / HPF 0 - 5 /HPF 0-5  WBC, UA 0 - 5 WBC/hpf >50  !: Data is abnormal  Urine culture ordered  Procedures: NST  Hospital Course: The patient was admitted to Labor and Delivery Triage for observation.   Discharge Condition: good  Disposition: Discharge disposition: 01-Home or Self Care  Diet: Diabetic diet, increase hydration  Discharge Activity: Activity as tolerated  Discharge Instructions     Discharge activity:  No Restrictions   Complete by: As directed    Discharge diet:   Complete by: As directed    Carb modified diabetic diet, increase hydration with plain water      Allergies as of 02/26/2023   No Known  Allergies      Medication List     STOP taking these medications    Dexcom G6 Receiver Devi   Dexcom G6 Transmitter Misc   Doxylamine-Pyridoxine 10-10 MG Tbec Commonly known as: Diclegis       TAKE these medications    Accu-Chek Guide test strip Generic drug: glucose blood Use as instructed   Accu-Chek Guide w/Device Kit 1 kit by Does not apply route as directed.   Accu-Chek Softclix Lancets lancets Use as instructed   aspirin EC 81 MG tablet Take 2 tablets (162 mg total) by mouth daily. Swallow whole.   glyBURIDE 5 MG tablet Commonly known as: DIABETA Take 1 tablet (5 mg total) by mouth at bedtime.   Prenate Mini 18-0.6-0.4-350 MG Caps Take 1 capsule daily.         Total time spent taking care of this patient: 28 minutes  Signed: Rod Can, CNM  02/26/2023, 1:44 PM

## 2023-02-27 ENCOUNTER — Other Ambulatory Visit: Payer: Medicaid Other

## 2023-02-27 LAB — URINE CULTURE: Culture: >=100000 — AB

## 2023-02-28 ENCOUNTER — Other Ambulatory Visit: Payer: Self-pay | Admitting: Obstetrics and Gynecology

## 2023-02-28 MED ORDER — AMPICILLIN 500 MG PO CAPS: 500.0000 mg | ORAL_CAPSULE | Freq: Four times a day (QID) | ORAL | 0 refills | Status: AC

## 2023-03-01 ENCOUNTER — Ambulatory Visit (INDEPENDENT_AMBULATORY_CARE_PROVIDER_SITE_OTHER): Payer: Medicaid Other

## 2023-03-01 ENCOUNTER — Other Ambulatory Visit: Payer: Self-pay

## 2023-03-01 ENCOUNTER — Ambulatory Visit (INDEPENDENT_AMBULATORY_CARE_PROVIDER_SITE_OTHER): Payer: Medicaid Other | Admitting: Obstetrics and Gynecology

## 2023-03-01 ENCOUNTER — Encounter: Payer: Self-pay | Admitting: Certified Nurse Midwife

## 2023-03-01 ENCOUNTER — Observation Stay: Payer: Medicaid Other

## 2023-03-01 ENCOUNTER — Observation Stay
Admission: EM | Admit: 2023-03-01 | Discharge: 2023-03-03 | Disposition: A | Discharge status: Home / Self Care | Payer: Medicaid Other | Attending: Obstetrics and Gynecology | Admitting: Obstetrics and Gynecology

## 2023-03-01 VITALS — BP 106/76 | HR 96 | Wt 317.9 lb

## 2023-03-01 VITALS — BP 106/76 | HR 96 | Ht 62.0 in | Wt 317.9 lb

## 2023-03-01 DIAGNOSIS — Z3A34 34 weeks gestation of pregnancy: Secondary | ICD-10-CM

## 2023-03-01 DIAGNOSIS — O368130 Decreased fetal movements, third trimester, not applicable or unspecified: Secondary | ICD-10-CM | POA: Diagnosis present

## 2023-03-01 DIAGNOSIS — Z6841 Body Mass Index (BMI) 40.0 and over, adult: Secondary | ICD-10-CM | POA: Insufficient documentation

## 2023-03-01 DIAGNOSIS — O0993 Supervision of high risk pregnancy, unspecified, third trimester: Secondary | ICD-10-CM

## 2023-03-01 DIAGNOSIS — E669 Obesity, unspecified: Secondary | ICD-10-CM

## 2023-03-01 DIAGNOSIS — Z79899 Other long term (current) drug therapy: Secondary | ICD-10-CM | POA: Insufficient documentation

## 2023-03-01 DIAGNOSIS — O24414 Gestational diabetes mellitus in pregnancy, insulin controlled: Secondary | ICD-10-CM

## 2023-03-01 DIAGNOSIS — O99213 Obesity complicating pregnancy, third trimester: Secondary | ICD-10-CM

## 2023-03-01 DIAGNOSIS — O24415 Gestational diabetes mellitus in pregnancy, controlled by oral hypoglycemic drugs: Secondary | ICD-10-CM

## 2023-03-01 DIAGNOSIS — O368390 Maternal care for abnormalities of the fetal heart rate or rhythm, unspecified trimester, not applicable or unspecified: Secondary | ICD-10-CM | POA: Diagnosis present

## 2023-03-01 DIAGNOSIS — O24419 Gestational diabetes mellitus in pregnancy, unspecified control: Secondary | ICD-10-CM

## 2023-03-01 DIAGNOSIS — Z7982 Long term (current) use of aspirin: Secondary | ICD-10-CM | POA: Insufficient documentation

## 2023-03-01 DIAGNOSIS — O1403 Mild to moderate pre-eclampsia, third trimester: Secondary | ICD-10-CM

## 2023-03-01 DIAGNOSIS — O1400 Mild to moderate pre-eclampsia, unspecified trimester: Secondary | ICD-10-CM | POA: Diagnosis present

## 2023-03-01 DIAGNOSIS — O99210 Obesity complicating pregnancy, unspecified trimester: Secondary | ICD-10-CM

## 2023-03-01 DIAGNOSIS — Z91199 Patient's noncompliance with other medical treatment and regimen due to unspecified reason: Secondary | ICD-10-CM

## 2023-03-01 DIAGNOSIS — Z7984 Long term (current) use of oral hypoglycemic drugs: Secondary | ICD-10-CM | POA: Insufficient documentation

## 2023-03-01 DIAGNOSIS — O368330 Maternal care for abnormalities of the fetal heart rate or rhythm, third trimester, not applicable or unspecified: Secondary | ICD-10-CM | POA: Diagnosis present

## 2023-03-01 HISTORY — DX: Type 2 diabetes mellitus without complications: E11.9

## 2023-03-01 LAB — COMPREHENSIVE METABOLIC PANEL
ALT: 20 U/L (ref 0–44)
AST: 21 U/L (ref 15–41)
Albumin: 2.9 g/dL — ABNORMAL LOW (ref 3.5–5.0)
Alkaline Phosphatase: 73 U/L (ref 38–126)
Anion gap: 8 (ref 5–15)
BUN: 8 mg/dL (ref 6–20)
CO2: 20 mmol/L — ABNORMAL LOW (ref 22–32)
Calcium: 8.9 mg/dL (ref 8.9–10.3)
Chloride: 105 mmol/L (ref 98–111)
Creatinine, Ser: 0.58 mg/dL (ref 0.44–1.00)
GFR, Estimated: >60 mL/min (ref >60)
Glucose, Bld: 183 mg/dL — ABNORMAL HIGH (ref 70–99)
Potassium: 4.1 mmol/L (ref 3.5–5.1)
Sodium: 133 mmol/L — ABNORMAL LOW (ref 135–145)
Total Bilirubin: 0.3 mg/dL (ref 0.3–1.2)
Total Protein: 6.8 g/dL (ref 6.5–8.1)

## 2023-03-01 LAB — CBC
HCT: 36 % (ref 36.0–46.0)
Hemoglobin: 12.1 g/dL (ref 12.0–15.0)
MCH: 28.2 pg (ref 26.0–34.0)
MCHC: 33.6 g/dL (ref 30.0–36.0)
MCV: 83.9 fL (ref 80.0–100.0)
Platelets: 216 10*3/uL (ref 150–400)
RBC: 4.29 MIL/uL (ref 3.87–5.11)
RDW: 13.6 % (ref 11.5–15.5)
WBC: 7.6 10*3/uL (ref 4.0–10.5)
nRBC: 0 % (ref 0.0–0.2)

## 2023-03-01 LAB — GLUCOSE, CAPILLARY
Glucose-Capillary: 238 mg/dL — ABNORMAL HIGH (ref 70–99)
Glucose-Capillary: 158 mg/dL — ABNORMAL HIGH (ref 70–99)
Glucose-Capillary: 219 mg/dL — ABNORMAL HIGH (ref 70–99)

## 2023-03-01 LAB — PROTEIN / CREATININE RATIO, URINE
Creatinine, Urine: 103 mg/dL
Protein Creatinine Ratio: 0.49 mg/mg{Cre} — ABNORMAL HIGH (ref 0.00–0.15)
Total Protein, Urine: 50 mg/dL

## 2023-03-01 LAB — BETA-HYDROXYBUTYRIC ACID: Beta-Hydroxybutyric Acid: 0.21 mmol/L (ref 0.05–0.27)

## 2023-03-01 MED ORDER — CALCIUM CARBONATE ANTACID 500 MG PO CHEW: 2.0000 | CHEWABLE_TABLET | ORAL | Status: DC | PRN

## 2023-03-01 MED ORDER — INSULIN NPH (HUMAN) (ISOPHANE) 100 UNIT/ML ~~LOC~~ SUSP
22.0000 [IU] | Freq: Two times a day (BID) | SUBCUTANEOUS | Status: DC
  Filled 2023-03-01: qty 10

## 2023-03-01 MED ORDER — INSULIN NPH (HUMAN) (ISOPHANE) 100 UNIT/ML ~~LOC~~ SUSP
22.0000 [IU] | Freq: Two times a day (BID) | SUBCUTANEOUS | Status: DC
  Administered 2023-03-01: 44 [IU] via SUBCUTANEOUS
  Administered 2023-03-02: 22 [IU] via SUBCUTANEOUS
  Administered 2023-03-02: 44 [IU] via SUBCUTANEOUS
  Filled 2023-03-01: qty 10

## 2023-03-01 MED ORDER — ONDANSETRON 4 MG PO TBDP
8.0000 mg | ORAL_TABLET | Freq: Two times a day (BID) | ORAL | Status: DC
  Administered 2023-03-02 – 2023-03-03 (×2): 8 mg via ORAL
  Filled 2023-03-01 (×2): qty 2

## 2023-03-01 MED ORDER — AMPICILLIN SODIUM 1 G IJ SOLR
1.0000 g | Freq: Once | INTRAMUSCULAR | Status: AC
  Administered 2023-03-01: 1 g via INTRAMUSCULAR
  Filled 2023-03-01: qty 1000

## 2023-03-01 MED ORDER — ACETAMINOPHEN 325 MG PO TABS
650.0000 mg | ORAL_TABLET | ORAL | Status: DC | PRN
  Administered 2023-03-02: 650 mg via ORAL
  Filled 2023-03-01: qty 2

## 2023-03-01 MED ORDER — PRENATAL MULTIVITAMIN CH
1.0000 | ORAL_TABLET | Freq: Every day | ORAL | Status: DC
  Administered 2023-03-02 – 2023-03-03 (×2): 1 via ORAL
  Filled 2023-03-01 (×2): qty 1

## 2023-03-01 MED ORDER — DOCUSATE SODIUM 100 MG PO CAPS
100.0000 mg | ORAL_CAPSULE | Freq: Every day | ORAL | Status: DC
  Administered 2023-03-02 – 2023-03-03 (×2): 100 mg via ORAL
  Filled 2023-03-01 (×2): qty 1

## 2023-03-01 MED ORDER — INSULIN ASPART 100 UNIT/ML IJ SOLN: 0.0000 [IU] | Freq: Three times a day (TID) | INTRAMUSCULAR | Status: DC

## 2023-03-01 MED ORDER — INSULIN ASPART 100 UNIT/ML IJ SOLN
0.0000 [IU] | Freq: Three times a day (TID) | INTRAMUSCULAR | Status: DC
  Administered 2023-03-01: 3 [IU] via SUBCUTANEOUS
  Administered 2023-03-02: 4 [IU] via SUBCUTANEOUS
  Administered 2023-03-02: 2 [IU] via SUBCUTANEOUS
  Administered 2023-03-02: 3 [IU] via SUBCUTANEOUS
  Administered 2023-03-02: 4 [IU] via SUBCUTANEOUS
  Administered 2023-03-03 (×2): 3 [IU] via SUBCUTANEOUS
  Administered 2023-03-03: 6 [IU] via SUBCUTANEOUS
  Administered 2023-03-03: 4 [IU] via SUBCUTANEOUS
  Filled 2023-03-01 (×9): qty 1

## 2023-03-01 MED ORDER — INSULIN NPH (HUMAN) (ISOPHANE) 100 UNIT/ML ~~LOC~~ SUSP
16.0000 [IU] | Freq: Two times a day (BID) | SUBCUTANEOUS | Status: DC
  Filled 2023-03-01: qty 10

## 2023-03-01 MED ORDER — INSULIN REGULAR HUMAN 100 UNIT/ML IJ SOLN
16.0000 [IU] | Freq: Two times a day (BID) | INTRAMUSCULAR | Status: DC
  Administered 2023-03-01 – 2023-03-02 (×2): 16 [IU] via SUBCUTANEOUS
  Filled 2023-03-01 (×2): qty 0.16

## 2023-03-01 MED ORDER — INSULIN REGULAR HUMAN 100 UNIT/ML IJ SOLN
16.0000 [IU] | Freq: Two times a day (BID) | INTRAMUSCULAR | Status: DC
  Filled 2023-03-01: qty 0.16

## 2023-03-01 MED ORDER — INSULIN ASPART 100 UNIT/ML IJ SOLN: 0.0000 [IU] | INTRAMUSCULAR | Status: DC

## 2023-03-01 NOTE — Progress Notes (Signed)
ROB [redacted]w[redacted]d NST today. Patient states she is very tired. She has good fetal movement and reports braxton hicks contractions, denies vaginal bleeding/leaking of fluid.

## 2023-03-01 NOTE — Progress Notes (Signed)
Pt returned to [place] from [place].

## 2023-03-01 NOTE — H&P (Signed)
ANTEPARTUM ADMISSION HISTORY AND PHYSICAL NOTE   History of Present Illness: Jenise Westfall is a 27 y.o. G2P1001 at 73w4dadmitted for observation due to mild pre eclampsia, uncontrolled GDM, non reactive NST in the office. She denies visual changes , headache, and epigastric pain.   Patient reports the fetal movement as active. Patient reports uterine contraction  activity as none. Patient reports  vaginal bleeding as none. Patient describes fluid per vagina as None. Fetal presentation is cephalic.  Patient Active Problem List   Diagnosis Date Noted   Labor and delivery, indication for care 03/01/2023   Gestational diabetes 03/01/2023   Vaginal bleeding in pregnancy, third trimester 02/26/2023   [redacted] weeks gestation of pregnancy 02/26/2023   Elevated blood pressure affecting pregnancy in third trimester, antepartum 02/14/2023   Gestational diabetes mellitus (GDM) in third trimester 02/14/2023   Trichomonal vaginitis during pregnancy in first trimester 09/12/2022   History of pre-eclampsia 09/06/2022   Supervision of high-risk pregnancy 09/06/2022   Mild pre-eclampsia affecting second pregnancy 01/28/2020   History of gestational diabetes 10/15/2019   Obesity affecting pregnancy in third trimester 08/11/2019   BMI 50.0-59.9, adult ([facility] 08/11/2019    Past Medical History:  Diagnosis Date   Diabetes mellitus without complication ([facility]    History of gestational diabetes 10/15/2019   Preeclampsia 01/08/2020   Results for orders placed or performed during the hospital encounter of 01/07/20 (from the past 24 hour(s)) Urinalysis, Routine w reflex microscopic     Status: Abnormal  Collection Time: 01/07/20 11:20 PM Result Value Ref Range  Color, Urine STRAW (A) YELLOW  APPearance CLEAR CLEAR  Specific Gravity, Urine 1.009 1.005 - 1.030  pH 7.0 5.0 - 8.0  Glucose, UA >=500 (A) NEGATIVE mg/dL  Hgb urine dips    Past Surgical History:  Procedure Laterality Date   no surgical       OB  History  Gravida Para Term Preterm AB Living  '2 1 1     1  '$ SAB IAB Ectopic Multiple Live Births          1    # Outcome Date GA Lbr Len/2nd Weight Sex Delivery Anes PTL Lv  2 Current           1 Term 01/2021 [id number] g  Vag-Spont   LIV    Social History   Socioeconomic History   Marital status: Single    Spouse name: MiOrlene Plum Number of children: 1   Years of education: 12   Highest education level: 12th grade  Occupational History   Not on file  Tobacco Use   Smoking status: Never   Smokeless tobacco: Never  Vaping Use   Vaping Use: Former  Substance and Sexual Activity   Alcohol use: Not Currently   Drug use: Never   Sexual activity: Not Currently    Comment: planning nexplanon  Other Topics Concern   Not on file  Social History Narrative   Not on file   Social Determinants of Health   Financial Resource Strain: Not on file  Food Insecurity: Not on file  Transportation Needs: Not on file  Physical Activity: Not on file  Stress: Not on file  Social Connections: Not on file    Family History  Problem Relation Age of Onset   Healthy Mother    Healthy Father     No Known Allergies  Medications Prior to Admission  Medication Sig Dispense Refill Last Dose   aspirin EC 81 MG  tablet Take 2 tablets (162 mg total) by mouth daily. Swallow whole. 300 tablet 3 02/28/2023   Blood Glucose Monitoring Suppl (ACCU-CHEK GUIDE) w/Device KIT 1 kit by Does not apply route as directed. 1 kit 0 02/28/2023   glucose blood (ACCU-CHEK GUIDE) test strip Use as instructed 100 each 12 02/28/2023   glyBURIDE (DIABETA) 5 MG tablet Take 1 tablet (5 mg total) by mouth at bedtime. (Patient taking differently: Take 5 mg by mouth 2 (two) times daily with a meal.) 30 tablet 2 02/28/2023   Accu-Chek Softclix Lancets lancets Use as instructed 100 each 6    ampicillin (PRINCIPEN) 500 MG capsule Take 1 capsule (500 mg total) by mouth 4 (four) times daily for 5 days. (Patient not taking:  Reported on 03/01/2023) 20 capsule 0    Prenat-FeCbn-FeAsp-Meth-FA-DHA (PRENATE MINI) 18-0.6-0.4-350 MG CAPS Take 1 capsule daily. (Patient not taking: Reported on 01/28/2023) 30 capsule 11     Review of Systems - Breast ROS: negative Respiratory ROS: no cough, shortness of breath, or wheezing Cardiovascular ROS: no chest pain or dyspnea on exertion Gastrointestinal ROS: no abdominal pain, change in bowel habits, or black or bloody stools Genito-Urinary ROS: no dysuria, trouble voiding, or hematuria UTI 02/26/23 Musculoskeletal ROS: negative Neurological ROS: negative  Vitals:  BP 133/78 (BP Location: Left Wrist)   Pulse 99   Temp 97.7 F (36.5 C) (Oral)   Resp 17   Ht '5\' 2"'$  (1.575 m)   Wt (!) 143.8 kg   LMP 06/03/2022 (Exact Date)   BMI 57.98 kg/m  Physical Examination: CONSTITUTIONAL: Well-developed, well-nourished , obese, female in no acute distress.  HENT:  Normocephalic, atraumatic, External right and left ear normal. Oropharynx is clear and moist NECK: Normal range of motion SKIN: Skin is warm and dry. No rash noted. Not diaphoretic. No erythema. No pallor. NEUROLOGIC: Alert and oriented to person, place, and time. Normal reflexes +1 bilaterally, no clonus, normal muscle tone coordination. PSYCHIATRIC: Normal mood and affect. Normal behavior. Normal judgment and thought content. CARDIOVASCULAR: Normal heart rate noted, regular rhythm RESPIRATORY: Effort and breath sounds normal, no problems with respiration noted ABDOMEN: Soft, nontender, nondistended, gravid. MUSCULOSKELETAL: Normal range of motion. No edema and no tenderness. Normal reflexes +1 bilaterally , no clonus  Cervix: Not evaluated and fetal presentation is cephalic per u/s. Membranes:intact Fetal Monitoring:Baseline: 140 bpm, Variability: Good {> 6 bpm), Accelerations: 10 x 10, and Decelerations: Absent Tocometer: Flat  Labs:  Results for orders placed or performed during the hospital encounter of 03/01/23 (from  the past 24 hour(s))  Glucose, capillary   Collection Time: 03/01/23  5:17 PM  Result Value Ref Range   Glucose-Capillary 238 (H) 70 - 99 mg/dL  Protein / creatinine ratio, urine   Collection Time: 03/01/23  6:40 PM  Result Value Ref Range   Creatinine, Urine 103 mg/dL   Total Protein, Urine 50 mg/dL   Protein Creatinine Ratio 0.49 (H) 0.00 - 0.15 mg/mg[Cre]  CBC   Collection Time: 03/01/23  6:54 PM  Result Value Ref Range   WBC 7.6 4.0 - 10.5 K/uL   RBC 4.29 3.87 - 5.11 MIL/uL   Hemoglobin 12.1 12.0 - 15.0 g/dL   HCT 36.0 36.0 - 46.0 %   MCV 83.9 80.0 - 100.0 fL   MCH 28.2 26.0 - 34.0 pg   MCHC 33.6 30.0 - 36.0 g/dL   RDW 13.6 11.5 - 15.5 %   Platelets 216 150 - 400 K/uL   nRBC 0.0 0.0 - 0.2 %  Imaging Studies: US FETAL BPP WO NON STRESS  Result Date: 03/01/2023 CLINICAL DATA:  Gestational diabetes, nonreactive nonstress test EXAM: BIOPHYSICAL PROFILE FINDINGS: Number of Fetuses: 1 Heart Rate: 143 bpm Presentation: Cephalic Movement: 2 time: 30 minutes Breathing: 0 Tone: 2 Amniotic Fluid: 2 Total Score: 6/8 IMPRESSION: Biophysical profile score of 6/8. No breathing episodes were reported by the technologist during the 30 minute exam. These results will be called to the ordering clinician or representative by the Radiologist Assistant, and communication documented in the PACS or [employer]. Electronically Signed   By: Davina Poke D.O.   On: 03/01/2023 18:35   US OB Limited  Result Date: 03/01/2023 CLINICAL DATA:  Gestational diabetes, nonreactive nonstress test EXAM: BIOPHYSICAL PROFILE FINDINGS: Number of Fetuses: 1 Heart Rate: 143 bpm Presentation: Cephalic Movement: 2 time: 30 minutes Breathing: 0 Tone: 2 Amniotic Fluid: 2 Total Score: 6/8 IMPRESSION: Biophysical profile score of 6/8. No breathing episodes were reported by the technologist during the 30 minute exam. These results will be called to the ordering clinician or representative by the Radiologist Assistant,  and communication documented in the PACS or [employer]. Electronically Signed   By: Davina Poke D.O.   On: 03/01/2023 18:35   [place] MFM OB DETAIL +14 WK  Result Date: 02/21/2023 ----------------------------------------------------------------------  OBSTETRICS REPORT                       (Signed Final 02/21/2023 04:36 pm) ---------------------------------------------------------------------- Patient Info  ID #:       [id number]                          D.O.B.:  08-Jun-1996 (26 yrs)  Name:       [name]                      Visit Date: 02/21/2023 03:16 pm ---------------------------------------------------------------------- Performed By  Attending:        Johnell Comings MD         Ref. Address:     [place]                                                             [place]  Performed By:     Rodrigo Ran BS      [facility]                    RD[facility] RVT  [facility] at                                                             [facility]  Referred By:      Herma Carson ---------------------------------------------------------------------- Orders  #  Description                           Code        Ordered By  1  [place] MFM OB DETAIL +14 [place]               76811.01    MELISSA SWANSON  2  [place] MFM FETAL BPP WO NON               76819.01    MELISSA SWANSON     STRESS ----------------------------------------------------------------------  #  Order #                     Accession #                Episode #  1  [id number]                   [id number]                 [id number]  2  [id number]                   [id number]                 [id number] ---------------------------------------------------------------------- Indications  Gestational diabetes in pregnancy,             O24.414  insulin/glyburide  Obesity  complicating pregnancy, third          O99.213  trimester  Antenatal screening for malformations          Z36.3  Pre-eclampsia                                  O14.90  Low risk NIPS, neg AFP  [redacted] weeks gestation of pregnancy                Z3A.33 ---------------------------------------------------------------------- Fetal Evaluation  Num Of Fetuses:         1  Cardiac Activity:       Observed  Presentation:           Cephalic  Placenta:               Posterior  P. Cord Insertion:      Not well visualized  Amniotic Fluid  AFI FV:      Within normal limits  AFI Sum(cm)     %Tile       Largest Pocket(cm)  16.77           61          6.33  RUQ(cm)       RLQ(cm)       LUQ(cm)        LLQ(cm)  1.92          4.27          4.25  6.33 ---------------------------------------------------------------------- Biophysical Evaluation  Amniotic F.V:   Within normal limits       F. Tone:        Observed  F. Movement:    Observed                   Score:          8/8  F. Breathing:   Observed ---------------------------------------------------------------------- Biometry  BPD:     80.97  mm     G. Age:  32w 4d         20  %    CI:        71.44   %    70 - 86                                                          FL/HC:      21.0   %    19.9 - 21.5  HC:    305.07   mm     G. Age:  34w 0d         26  %    HC/AC:      0.97        0.96 - 1.11  AC:    313.16   mm     G. Age:  35w 2d         93  %    FL/BPD:     79.0   %    71 - 87  FL:      63.93  mm     G. Age:  33w 0d         28  %    FL/AC:      20.4   %    20 - 24  CER:      39.6  mm     G. Age:  32w 1d         10  %  LV:        3.4  mm  Est. FW:    2382  gm      5 lb 4 oz     67  % ---------------------------------------------------------------------- OB History  Gravidity:    2         Term:   1        Prem:   0        SAB:   0  TOP:          0       Ectopic:  0        Living: 1 ---------------------------------------------------------------------- Gestational Age  LMP:            37w 4d        Date:  06/03/22                  EDD:   03/10/23  U/S Today:     33w 5d                                        EDD:   04/06/23  Best:          33w 3d     Det. By:  Early Ultrasound         EDD:   04/08/23                                      (08/13/22) ---------------------------------------------------------------------- Anatomy  Cranium:               Appears normal         Aortic Arch:            Not well visualized  Cavum:                 Appears normal         Ductal Arch:            Not well visualized  Ventricles:            Appears normal         Diaphragm:              Appears normal  Choroid Plexus:        Appears normal         Stomach:                Appears normal, left                                                                        sided  Cerebellum:            Appears normal         Abdomen:                Appears normal  Posterior Fossa:       Appears normal         Abdominal Wall:         Not well visualized  Nuchal Fold:           Not applicable (Q000111Q    Cord Vessels:           Not well visualized                         wks GA)  Face:                  Appears normal         Kidneys:                Appear normal                         (orbits and profile)  Lips:                  Appears normal         Bladder:                Appears normal  Thoracic:              Appears normal         Spine:                  Limited views  appear normal  Heart:                 Appears normal         Upper Extremities:      LUE appears                         (4CH, axis, and                                normal; RUE nwv                         situs)  RVOT:                  Not well visualized    Lower Extremities:      Appears normal  LVOT:                  Not well visualized  Other:  Fetus appears to be female. Lenses and nasal bone visualized.          Technically difficult due to maternal habitus and fetal position.  ---------------------------------------------------------------------- Cervix Uterus Adnexa  Cervix  Not visualized (advanced GA >24wks)  Uterus  Single, anterior fibroid noted  Right Ovary  Not visualized.  Left Ovary  Not visualized.  Cul De Sac  No free fluid seen.  Adnexa  No abnormality visualized ---------------------------------------------------------------------- Comments  This patient was seen for an ultrasound exam due to  maternal obesity with a BMI of 58 and recently diagnosed  gestational diabetes that is treated with glyburide 5 mg twice  a day.  She reports that her glycemic control is "okay".  She was informed that the fetal growth and amniotic fluid  level were appropriate for her gestational age.  A BPP performed today was 8 out of 8.  The views of the fetal anatomy were limited today due to her  advanced gestational age.  The patient was informed that anomalies may be missed due  to technical limitations. If the fetus is in a suboptimal position  or maternal habitus is increased, visualization of the fetus in  the maternal uterus may be impaired.  Due to gestational diabetes, she should continue weekly fetal  testing until delivery.  The patient reports that she already has  the weekly NSTs scheduled in your office.  Should her glycemic control remain poor, delivery should be  considered at between 37 to 38 weeks.  A follow-up growth scan and BPP was scheduled in our office  in 4 weeks. ----------------------------------------------------------------------                   Johnell Comings, MD Electronically Signed Final Report   02/21/2023 04:36 pm ----------------------------------------------------------------------  [place] MFM FETAL BPP WO NON STRESS  Result Date: 02/21/2023 ----------------------------------------------------------------------  OBSTETRICS REPORT                       (Signed Final 02/21/2023 04:36 pm) ---------------------------------------------------------------------- Patient  Info  ID #:       [id number]                          D.O.B.:  06-07-1996 (26 yrs)  Name:       [name]  Visit Date: 02/21/2023 03:16 pm ---------------------------------------------------------------------- Performed By  Attending:        Johnell Comings MD         Ref. Address:     [place]                                                             [place]  Performed By:     Rodrigo Ran BS      Location[facility]         [facility]                    RDMS RVT                                 Fetal Care at                                                             [facility]  Referred By:      Herma Carson ---------------------------------------------------------------------- Orders  #  Description                           Code        Ordered By  1  [place] MFM OB DETAIL +14 [place]               76811.01    MELISSA SWANSON  2  [place] MFM FETAL BPP WO NON               76819.01    MELISSA SWANSON     STRESS ----------------------------------------------------------------------  #  Order #                     Accession #                Episode #  1  [id number]                   [id number]                 [id number]  2  [id number]                   [id number]  [id number] ---------------------------------------------------------------------- Indications  Gestational diabetes in pregnancy,             O24.414  insulin/glyburide  Obesity complicating pregnancy, third          O99.213  trimester  Antenatal screening for malformations          Z36.3  Pre-eclampsia                                  O14.90  Low risk NIPS, neg AFP  [redacted] weeks gestation of pregnancy                Z3A.33 ---------------------------------------------------------------------- Fetal Evaluation  Num Of Fetuses:         1  Cardiac Activity:       Observed  Presentation:            Cephalic  Placenta:               Posterior  P. Cord Insertion:      Not well visualized  Amniotic Fluid  AFI FV:      Within normal limits  AFI Sum(cm)     %Tile       Largest Pocket(cm)  16.77           61          6.33  RUQ(cm)       RLQ(cm)       LUQ(cm)        LLQ(cm)  1.92          4.27          4.25           6.33 ---------------------------------------------------------------------- Biophysical Evaluation  Amniotic F.V:   Within normal limits       F. Tone:        Observed  F. Movement:    Observed                   Score:          8/8  F. Breathing:   Observed ---------------------------------------------------------------------- Biometry  BPD:     80.97  mm     G. Age:  32w 4d         20  %    CI:        71.44   %    70 - 86                                                          FL/HC:      21.0   %    19.9 - 21.5  HC:    305.07   mm     G. Age:  34w 0d         26  %    HC/AC:      0.97        0.96 - 1.11  AC:    313.16   mm     G. Age:  35w 2d         93  %    FL/BPD:     79.0   %    71 - 87  FL:      63.93  mm  G. Age:  33w 0d         28  %    FL/AC:      20.4   %    20 - 24  CER:      39.6  mm     G. Age:  32w 1d         10  %  LV:        3.4  mm  Est. FW:    2382  gm      5 lb 4 oz     67  % ---------------------------------------------------------------------- OB History  Gravidity:    2         Term:   1        Prem:   0        SAB:   0  TOP:          0       Ectopic:  0        Living: 1 ---------------------------------------------------------------------- Gestational Age  LMP:           37w 4d        Date:  06/03/22                  EDD:   03/10/23  U/S Today:     33w 5d                                        EDD:   04/06/23  Best:          33w 3d     Det. By:  Loman Chroman         EDD:   04/08/23                                      (08/13/22) ---------------------------------------------------------------------- Anatomy  Cranium:               Appears normal         Aortic Arch:             Not well visualized  Cavum:                 Appears normal         Ductal Arch:            Not well visualized  Ventricles:            Appears normal         Diaphragm:              Appears normal  Choroid Plexus:        Appears normal         Stomach:                Appears normal, left                                                                        sided  Cerebellum:            Appears  normal         Abdomen:                Appears normal  Posterior Fossa:       Appears normal         Abdominal Wall:         Not well visualized  Nuchal Fold:           Not applicable (Q000111Q    Cord Vessels:           Not well visualized                         wks GA)  Face:                  Appears normal         Kidneys:                Appear normal                         (orbits and profile)  Lips:                  Appears normal         Bladder:                Appears normal  Thoracic:              Appears normal         Spine:                  Limited views                                                                        appear normal  Heart:                 Appears normal         Upper Extremities:      LUE appears                         (4CH, axis, and                                normal; RUE nwv                         situs)  RVOT:                  Not well visualized    Lower Extremities:      Appears normal  LVOT:                  Not well visualized  Other:  Fetus appears to be female. Lenses and nasal bone visualized.          Technically difficult due to maternal habitus and fetal position. ---------------------------------------------------------------------- Cervix Uterus Adnexa  Cervix  Not visualized (advanced GA >24wks)  Uterus  Single, anterior fibroid noted  Right Ovary  Not visualized.  Left Ovary  Not visualized.  Cul De Sac  No free fluid seen.  Adnexa  No abnormality visualized ---------------------------------------------------------------------- Comments  This patient was seen for an  ultrasound exam due to  maternal obesity with a BMI of 58 and recently diagnosed  gestational diabetes that is treated with glyburide 5 mg twice  a day.  She reports that her glycemic control is "okay".  She was informed that the fetal growth and amniotic fluid  level were appropriate for her gestational age.  A BPP performed today was 8 out of 8.  The views of the fetal anatomy were limited today due to her  advanced gestational age.  The patient was informed that anomalies may be missed due  to technical limitations. If the fetus is in a suboptimal position  or maternal habitus is increased, visualization of the fetus in  the maternal uterus may be impaired.  Due to gestational diabetes, she should continue weekly fetal  testing until delivery.  The patient reports that she already has  the weekly NSTs scheduled in your office.  Should her glycemic control remain poor, delivery should be  considered at between 37 to 38 weeks.  A follow-up growth scan and BPP was scheduled in our office  in 4 weeks. ----------------------------------------------------------------------                   Johnell Comings, MD Electronically Signed Final Report   02/21/2023 04:36 pm ----------------------------------------------------------------------    Assessment and Plan: Patient Active Problem List   Diagnosis Date Noted   Labor and delivery, indication for care 03/01/2023   Gestational diabetes 03/01/2023   Vaginal bleeding in pregnancy, third trimester 02/26/2023   [redacted] weeks gestation of pregnancy 02/26/2023   Elevated blood pressure affecting pregnancy in third trimester, antepartum 02/14/2023   Gestational diabetes mellitus (GDM) in third trimester 02/14/2023   Trichomonal vaginitis during pregnancy in first trimester 09/12/2022   History of pre-eclampsia 09/06/2022   Supervision of high-risk pregnancy 09/06/2022   Mild pre-eclampsia affecting second pregnancy 01/28/2020   History of gestational diabetes 10/15/2019    Obesity affecting pregnancy in third trimester 08/11/2019   BMI 50.0-59.9, adult ([place]) 08/11/2019   Dr. Marcelline Mates consulted on plan of care.   Antenatal observation for prolonged monitoring and  Blood sugar management   Philip Aspen, CNM

## 2023-03-01 NOTE — Progress Notes (Addendum)
ROB: Patient is a 27 y.o. G2P1001 at 32w4dwho presents for high risk OB care. Currently with mild pre-eclampsia, uncontrolled GDM.  Notes significant vomiting yesterday, almost went to ER however it resolved later in the day.  [place].  Today NST non-reactive (rare 10 x 10 accel), but good variability. Monitored for ~ 1 hour, given juice.  Reviewed blood sugars as patient has just now become more compliant with checking blood sugars (notes she has her boyfriend prick her finger), noting blood sugars ranging from 150s-300s.  Started on Glyburide last visit (5 mg BID) however at this time patient will most likely need insulin. Will send to L&D for further fetal monitoring and possible BPP.  Will also admit to observation to work on glucose control. Patient also with UTI, prescribed ampicillin QID.

## 2023-03-01 NOTE — OB Triage Note (Signed)
Pt G2P1 58w4dpresents from office for fetal monitoring, BPP, and glucose management. +FM. Denies LOF, bleeding.ctx. Reports she has not checked her BG today and did not take her glyburide this AM. VSS. CBG 238. CNM and MD made aware of pt arrival.

## 2023-03-01 NOTE — Progress Notes (Signed)
Pt off unit for US

## 2023-03-02 DIAGNOSIS — O1403 Mild to moderate pre-eclampsia, third trimester: Secondary | ICD-10-CM | POA: Diagnosis not present

## 2023-03-02 DIAGNOSIS — O24414 Gestational diabetes mellitus in pregnancy, insulin controlled: Secondary | ICD-10-CM | POA: Diagnosis present

## 2023-03-02 DIAGNOSIS — O368390 Maternal care for abnormalities of the fetal heart rate or rhythm, unspecified trimester, not applicable or unspecified: Secondary | ICD-10-CM | POA: Diagnosis present

## 2023-03-02 DIAGNOSIS — Z3A34 34 weeks gestation of pregnancy: Secondary | ICD-10-CM

## 2023-03-02 DIAGNOSIS — O368330 Maternal care for abnormalities of the fetal heart rate or rhythm, third trimester, not applicable or unspecified: Secondary | ICD-10-CM

## 2023-03-02 DIAGNOSIS — O24419 Gestational diabetes mellitus in pregnancy, unspecified control: Secondary | ICD-10-CM | POA: Diagnosis not present

## 2023-03-02 DIAGNOSIS — Z91199 Patient's noncompliance with other medical treatment and regimen due to unspecified reason: Secondary | ICD-10-CM | POA: Diagnosis not present

## 2023-03-02 DIAGNOSIS — Z7982 Long term (current) use of aspirin: Secondary | ICD-10-CM | POA: Diagnosis not present

## 2023-03-02 DIAGNOSIS — O99213 Obesity complicating pregnancy, third trimester: Secondary | ICD-10-CM | POA: Diagnosis present

## 2023-03-02 LAB — GLUCOSE, CAPILLARY
Glucose-Capillary: 115 mg/dL — ABNORMAL HIGH (ref 70–99)
Glucose-Capillary: 198 mg/dL — ABNORMAL HIGH (ref 70–99)
Glucose-Capillary: 189 mg/dL — ABNORMAL HIGH (ref 70–99)
Glucose-Capillary: 143 mg/dL — ABNORMAL HIGH (ref 70–99)
Glucose-Capillary: 172 mg/dL — ABNORMAL HIGH (ref 70–99)

## 2023-03-02 MED ORDER — INSULIN STARTER KIT- PEN NEEDLES (ENGLISH)
1.0000 | Freq: Once | Status: AC
  Administered 2023-03-02: 1
  Filled 2023-03-02: qty 1

## 2023-03-02 MED ORDER — GLYBURIDE 5 MG PO TABS
10.0000 mg | ORAL_TABLET | Freq: Two times a day (BID) | ORAL | Status: DC
  Administered 2023-03-02 – 2023-03-03 (×3): 10 mg via ORAL
  Filled 2023-03-02 (×4): qty 2

## 2023-03-02 MED ORDER — INSULIN REGULAR HUMAN 100 UNIT/ML IJ SOLN
22.0000 [IU] | Freq: Two times a day (BID) | INTRAMUSCULAR | Status: DC
  Administered 2023-03-02: 22 [IU] via SUBCUTANEOUS

## 2023-03-02 NOTE — Inpatient Diabetes Management (Signed)
Inpatient Diabetes Program Recommendations  AACE/ADA: New Consensus Statement on Inpatient Glycemic Control (2015)  Target Ranges:  Prepandial:   less than 140 mg/dL      Peak postprandial:   less than 180 mg/dL (1-2 hours)      Critically ill patients:  140 - 180 mg/dL   Lab Results  Component Value Date   GLUCAP 198 (H) 03/02/2023   HGBA1C 5.8 (H) 08/13/2022    Review of Glycemic Control  Diabetes history: GDM Outpatient Diabetes medications: glyburide 5 mg QHS Current orders for Inpatient glycemic control: NPH 22 units QAM and 44 units QHS, Novolin R 16 units BID with meals, Novolog 0-16 after meals and HS  HgbA1C - 5.8% Will be discharging home on insulin.  Inpatient Diabetes Program Recommendations:    Noted Novolog 16 units BID with meals  If FBS is above goal of 70-90, consider increasing PM dose of NPH to 46 units QHS  Will order Insulin pen starter kit. RN to teach insulin pen administration.   Continue to follow glucose trends.  Thank you. Lorenda Peck, RD, LDN, [place] Inpatient Diabetes Coordinator [phone]

## 2023-03-02 NOTE — Progress Notes (Addendum)
Antenatal Progress Note  Subjective:     Patient ID: Dominique Sanders is a 27 y.o. G2P1001  female [redacted]w[redacted]d Estimated Date of Delivery: 04/08/23 dated by with 1st trimester sono at 6 weeks who was admitted for glucose control and non-reassuring antenatal testing. Sent from office for further surveillance due to non-reactive NSTShe has a history of gestational diabetes (noncompliant), and mild pre-eclampsia being monitored outpatient. .Marland KitchenHD# 0.   Subjective:  Patient denies complaints today.  Notes difficulty sleeping last night but otherwise doing ok.   Review of Systems Denies contractions, leakage of fluids, vaginal bleeding, and reports good fetal movement.     Objective:   Vitals:   03/02/23 0433 03/02/23 0638 03/02/23 0738 03/02/23 0855  BP: 120/79 119/72 129/80 128/73  Pulse: 100 85 90 94  Resp: 12  16   Temp:   97.7 F (36.5 C)   TempSrc:   Oral   Weight:      Height:        General appearance: alert, no distress, and morbidly obese Lungs: clear to auscultation bilaterally Heart: regular rate and rhythm, S1, S2 normal, no murmur, click, rub or gallop Abdomen: soft, non-tender; bowel sounds normal; no masses,  no organomegaly and gravid Pelvic: deferred Extremities: extremities normal, atraumatic, no cyanosis or edema   Fetal Monitoring Interpretation   Indication: Unsatisfactory BPP and Gestational DM (uncontrolled)  Fetal Heart Rate A Mode: External Baseline Rate (A): 135 bpm Variability: Minimal Accelerations: 10 x 10 Decelerations: None  Uterine Activity Mode: Toco Contraction Frequency (min): OCCASSIONAL WITH UI Contraction Duration (sec): 30-50 Contraction Quality: Mild Resting Tone Palpated: Relaxed Resting Time: Adequate    Labs:  CBC Lab Results  Component Value Date   WBC 7.6 03/01/2023   HGB 12.1 03/01/2023   HCT 36.0 03/01/2023   MCV 83.9 03/01/2023   PLT 216 03/01/2023    CMP     Component Value Date/Time   NA 133 (L) 03/01/2023 1854    NA 136 02/20/2023 1528   K 4.1 03/01/2023 1854   CL 105 03/01/2023 1854   CO2 20 (L) 03/01/2023 1854   GLUCOSE 183 (H) 03/01/2023 1854   BUN 8 03/01/2023 1854   BUN 9 02/20/2023 1528   CREATININE 0.58 03/01/2023 1854   CALCIUM 8.9 03/01/2023 1854   PROT 6.8 03/01/2023 1854   PROT 6.6 02/20/2023 1528   ALBUMIN 2.9 (L) 03/01/2023 1854   ALBUMIN 3.6 (L) 02/20/2023 1528   AST 21 03/01/2023 1854   ALT 20 03/01/2023 1854   ALKPHOS 73 03/01/2023 1854   BILITOT 0.3 03/01/2023 1854   BILITOT 0.2 02/20/2023 1528   GFRNONAA >60 03/01/2023 1854   GFRAA 134 01/19/2020 1456      Latest Reference Range & Units 03/01/23 18:40  Protein Creatinine Ratio 0.00 - 0.15 mg/mgCre 0.49 (H)  (H): Data is abnormally high   Latest Reference Range & Units 03/01/23 17:17 03/01/23 19:20 03/01/23 22:01 03/02/23 08:16  Glucose-Capillary 70 - 99 mg/dL 238 (H) 158 (H) 219 (H) 115 (H)  (H): Data is abnormally high   Imaging:  [place] [employer] CLINICAL DATA:  Gestational diabetes, nonreactive nonstress test  EXAM: BIOPHYSICAL PROFILE  FINDINGS: Number of Fetuses: 1  Heart Rate: 143 bpm  Presentation: Cephalic  Movement: 2 time: 30 minutes  Breathing: 0  Tone: 2  Amniotic Fluid: 2  Total Score: 6/8  IMPRESSION: Biophysical profile score of 6/8. No breathing episodes were reported by the technologist during the 30 minute exam.  These results  will be called to the ordering clinician or representative by the Radiologist Assistant, and communication documented in the PACS or [employer].  Electronically Signed   By: Davina Poke D.O.   On: 03/01/2023 18:35 US FETAL BPP WO NON STRESS CLINICAL DATA:  Gestational diabetes, nonreactive nonstress test  EXAM: BIOPHYSICAL PROFILE  FINDINGS: Number of Fetuses: 1  Heart Rate: 143 bpm  Presentation: Cephalic  Movement: 2 time: 30 minutes  Breathing: 0  Tone: 2  Amniotic Fluid: 2  Total Score: 6/8  IMPRESSION: Biophysical  profile score of 6/8. No breathing episodes were reported by the technologist during the 30 minute exam.  These results will be called to the ordering clinician or representative by the Radiologist Assistant, and communication documented in the PACS or [employer].  Electronically Signed   By: Davina Poke D.O.   On: 03/01/2023 18:35    MFM Growth Scan ----------------------------------------------------------------------  OBSTETRICS REPORT                       (Signed Final 02/21/2023 04:36 pm) ---------------------------------------------------------------------- Patient Info  ID #:       [id number]                          D.O.B.:  May 08, 1996 (26 yrs)  Name:       Dominique Sanders                      Visit Date: 02/21/2023 03:16 pm ---------------------------------------------------------------------- Performed By  Attending:        Johnell Comings MD         Ref. Address:     [place]                                                             [place]  Performed By:     Rodrigo Ran BS      [facility]                                 [facility]  Referred By:      Herma Carson ---------------------------------------------------------------------- Orders  #  Description                           Code        Ordered By  1  [place] MFM OB DETAIL +14 [place]               76811.01    MELISSA SWANSON  2  [place] MFM FETAL BPP WO NON               76819.01    MELISSA SWANSON     STRESS ----------------------------------------------------------------------  #  Order #                     Accession #                Episode #  1  [id number]                   [id number]                 [id number]  2  [id number]                    [id number]                 [id number] ---------------------------------------------------------------------- Indications  Gestational diabetes in pregnancy,             O24.414  insulin/glyburide  Obesity complicating pregnancy, third          O99.213  trimester  Antenatal screening for malformations          Z36.3  Pre-eclampsia                                  O14.90  Low risk NIPS, neg AFP  [redacted] weeks gestation of pregnancy                Z3A.33 ---------------------------------------------------------------------- Fetal Evaluation  Num Of Fetuses:         1  Cardiac Activity:       Observed  Presentation:           Cephalic  Placenta:               Posterior  P. Cord Insertion:      Not well visualized  Amniotic Fluid  AFI FV:      Within normal limits  AFI Sum(cm)     %Tile       Largest Pocket(cm)  16.77           61          6.33  RUQ(cm)       RLQ(cm)       LUQ(cm)        LLQ(cm)  1.92          4.27          4.25           6.33 ---------------------------------------------------------------------- Biophysical Evaluation  Amniotic F.V:   Within normal limits       F. Tone:        Observed  F. Movement:    Observed                   Score:  8/8  F. Breathing:   Observed ---------------------------------------------------------------------- Biometry  BPD:     80.97  mm     G. Age:  32w 4d         20  %    CI:        71.44   %    70 - 86                                                          FL/HC:      21.0   %    19.9 - 21.5  HC:    305.07   mm     G. Age:  34w 0d         26  %    HC/AC:      0.97        0.96 - 1.11  AC:    313.16   mm     G. Age:  35w 2d         93  %    FL/BPD:     79.0   %    71 - 87  FL:      63.93  mm     G. Age:  33w 0d         28  %    FL/AC:      20.4   %    20 - 24  CER:      39.6  mm     G. Age:  32w 1d         10  %  LV:        3.4  mm  Est. FW:    2382  gm      5 lb 4 oz     67   % ---------------------------------------------------------------------- OB History  Gravidity:    2         Term:   1        Prem:   0        SAB:   0  TOP:          0       Ectopic:  0        Living: 1 ---------------------------------------------------------------------- Gestational Age  LMP:           37w 4d        Date:  06/03/22                  EDD:   03/10/23  U/S Today:     33w 5d                                        EDD:   04/06/23  Best:          33w 3d     Det. ByLoman Chroman         EDD:   04/08/23                                      (08/13/22) ---------------------------------------------------------------------- Anatomy  Cranium:  Appears normal         Aortic Arch:            Not well visualized  Cavum:                 Appears normal         Ductal Arch:            Not well visualized  Ventricles:            Appears normal         Diaphragm:              Appears normal  Choroid Plexus:        Appears normal         Stomach:                Appears normal, left                                                                        sided  Cerebellum:            Appears normal         Abdomen:                Appears normal  Posterior Fossa:       Appears normal         Abdominal Wall:         Not well visualized  Nuchal Fold:           Not applicable ([id number]    Cord Vessels:           Not well visualized                         wks GA)  Face:                  Appears normal         Kidneys:                Appear normal                         (orbits and profile)  Lips:                  Appears normal         Bladder:                Appears normal  Thoracic:              Appears normal         Spine:                  Limited views                                                                        appear normal  Heart:  Appears normal         Upper Extremities:      LUE appears                         (4CH, axis, and                                 normal; RUE nwv                         situs)  RVOT:                  Not well visualized    Lower Extremities:      Appears normal  LVOT:                  Not well visualized  Other:  Fetus appears to be female. Lenses and nasal bone visualized.          Technically difficult due to maternal habitus and fetal position. ---------------------------------------------------------------------- Cervix Uterus Adnexa  Cervix  Not visualized (advanced GA >24wks)  Uterus  Single, anterior fibroid noted  Right Ovary  Not visualized.  Left Ovary  Not visualized.  Cul De Sac  No free fluid seen.  Adnexa  No abnormality visualized ---------------------------------------------------------------------- Comments  This patient was seen for an ultrasound exam due to  maternal obesity with a BMI of 58 and recently diagnosed  gestational diabetes that is treated with glyburide 5 mg twice  a day.  She reports that her glycemic control is "okay".  She was informed that the fetal growth and amniotic fluid  level were appropriate for her gestational age.  A BPP performed today was 8 out of 8.  The views of the fetal anatomy were limited today due to her  advanced gestational age.  The patient was informed that anomalies may be missed due  to technical limitations. If the fetus is in a suboptimal position  or maternal habitus is increased, visualization of the fetus in  the maternal uterus may be impaired.  Due to gestational diabetes, she should continue weekly fetal  testing until delivery.  The patient reports that she already has  the weekly NSTs scheduled in your office.  Should her glycemic control remain poor, delivery should be  considered at between 37 to 38 weeks.  A follow-up growth scan and BPP was scheduled in our office  in 4 weeks. ----------------------------------------------------------------------                   Johnell Comings, MD Electronically Signed Final Report    02/21/2023 04:36 pm  Assessment:  27 y.o.  G2P1001 female [redacted]w[redacted]d with:   Non-reassuring antenatal testing Gestational diabetes, uncontrolled Mild pre-eclampsia in third trimester Morbid obesity (BMI 57.98)  Plan:   Non-reassuring antenatal testing - Patient with non-reassuring NST in office yesterday, BPP performed yesterday 6/8 (2 off for breathing).  Has been on continuous monitoring since yesterday with more reassuring fetal tracing.  Gestational diabetes, uncontrolled - Patient has been non-compliant with checking blood sugars, just recently became more compliant ~ 2 weeks ago. Was started on Glyburide 5 mg 2 weeks ago based on few sugars that were documented.  In office yesterday patient with more completed blood sugar log, blood sugars ranging from 140s-300s.  Admitted for glucose control and initiation of insulin.  Current regimen NPH 44  AM/16 PM, R 22 AM/16 PM. Will continue to titrate based on blood sugars today. Most recent growth scan 2 weeks ago noting growth at 67%ile (although AC at [place]). Will plan for delivery between 37-38 weeks depending on level of control at that time. Will consult MFM for any further recommendations.  - Will consult Diabetes coordinator to educate patient on insulin administration.   Mild pre-eclampsia in third trimester - BPs currently well controlled on no meds. Still with mild proteinuria. Will continue to monitor with weekly labs. Plan for delivery at 37-38 weeks.   Morbid obesity (BMI 57.98) - Patient has had first anesthesia consult. Will get second consult while inpatient. Patient aware of Anesthesia BMI cut-off of 60 which will require transfer to tertiary facility.     A total of 38 minutes were spent during this encounter, including review of previous progress notes, recent imaging and labs, face-to-face with time with patient involving counseling and coordination of care, as well as documentation for current visit.  Rubie Maid,  MD [facility] at [facility]

## 2023-03-03 ENCOUNTER — Inpatient Hospital Stay: Payer: Medicaid Other

## 2023-03-03 ENCOUNTER — Encounter: Payer: Self-pay | Admitting: Obstetrics and Gynecology

## 2023-03-03 DIAGNOSIS — O24419 Gestational diabetes mellitus in pregnancy, unspecified control: Secondary | ICD-10-CM | POA: Diagnosis not present

## 2023-03-03 DIAGNOSIS — O99213 Obesity complicating pregnancy, third trimester: Secondary | ICD-10-CM

## 2023-03-03 DIAGNOSIS — Z3A34 34 weeks gestation of pregnancy: Secondary | ICD-10-CM | POA: Diagnosis not present

## 2023-03-03 DIAGNOSIS — E669 Obesity, unspecified: Secondary | ICD-10-CM

## 2023-03-03 DIAGNOSIS — O1403 Mild to moderate pre-eclampsia, third trimester: Secondary | ICD-10-CM | POA: Diagnosis not present

## 2023-03-03 DIAGNOSIS — O368330 Maternal care for abnormalities of the fetal heart rate or rhythm, third trimester, not applicable or unspecified: Secondary | ICD-10-CM | POA: Diagnosis not present

## 2023-03-03 LAB — GLUCOSE, CAPILLARY
Glucose-Capillary: 91 mg/dL (ref 70–99)
Glucose-Capillary: 160 mg/dL — ABNORMAL HIGH (ref 70–99)
Glucose-Capillary: 147 mg/dL — ABNORMAL HIGH (ref 70–99)
Glucose-Capillary: 112 mg/dL — ABNORMAL HIGH (ref 70–99)
Glucose-Capillary: 182 mg/dL — ABNORMAL HIGH (ref 70–99)
Glucose-Capillary: 207 mg/dL — ABNORMAL HIGH (ref 70–99)

## 2023-03-03 MED ORDER — INSULIN REGULAR HUMAN 100 UNIT/ML IJ SOLN: 34.0000 [IU] | Freq: Two times a day (BID) | INTRAMUSCULAR | Status: DC

## 2023-03-03 MED ORDER — ACCU-CHEK GUIDE VI STRP: ORAL_STRIP | 12 refills | Status: DC

## 2023-03-03 MED ORDER — INSULIN NPH (HUMAN) (ISOPHANE) 100 UNIT/ML ~~LOC~~ SUSP: 24.0000 [IU] | Freq: Two times a day (BID) | SUBCUTANEOUS | Status: DC

## 2023-03-03 MED ORDER — INSULIN NPH (HUMAN) (ISOPHANE) 100 UNIT/ML ~~LOC~~ SUSP: 24.0000 [IU] | Freq: Every day | SUBCUTANEOUS | Status: DC

## 2023-03-03 MED ORDER — ACCU-CHEK SOFTCLIX LANCETS MISC: 6 refills | Status: DC

## 2023-03-03 MED ORDER — INSULIN REGULAR HUMAN 100 UNIT/ML IJ SOLN: INTRAMUSCULAR | 11 refills | Status: DC

## 2023-03-03 MED ORDER — INSULIN REGULAR HUMAN 100 UNIT/ML IJ SOLN
24.0000 [IU] | Freq: Two times a day (BID) | INTRAMUSCULAR | Status: DC
  Administered 2023-03-03: 38 [IU] via SUBCUTANEOUS
  Administered 2023-03-03: 24 [IU] via SUBCUTANEOUS

## 2023-03-03 MED ORDER — INSULIN NPH (HUMAN) (ISOPHANE) 100 UNIT/ML ~~LOC~~ SUSP
74.0000 [IU] | Freq: Every day | SUBCUTANEOUS | Status: DC
  Administered 2023-03-03: 74 [IU] via SUBCUTANEOUS
  Filled 2023-03-03: qty 10

## 2023-03-03 MED ORDER — INSULIN NPH (HUMAN) (ISOPHANE) 100 UNIT/ML ~~LOC~~ SUSP: SUBCUTANEOUS | 11 refills | Status: DC

## 2023-03-03 MED ORDER — GLYBURIDE 5 MG PO TABS: 10.0000 mg | ORAL_TABLET | Freq: Two times a day (BID) | ORAL | 1 refills | Status: DC

## 2023-03-03 NOTE — Progress Notes (Signed)
Patient is a 27 yo female who is presenting as a follow up consultation from Dr. Marcelline Mates.  Patient was seen at 25-26 weeks for morbid obesity (BMI 57.2) during pregnancy and is now at almost [redacted] weeks pregnant.  Patient came in for evaluation for mild pre eclampsia and uncontrolled gestational diabetes.  She was scheduled to see [place] this week for repeat evaluation, but given that she is already here as well as has significantly difficulty with transportation we felt it of good utility to see here while inpatient.  Patient has been doing well with little weight gain during the pregnancy, and is now at a BMI of 57.9.  Her airway evaluation is unchanged from prior and she is still a MP1 airway, with good thyromental distance, and good neck mobility.  We discussed her prior appointment and whether she had any questions about our previous discussion about options for pain management.  She did not, and had a full understanding of all the risks previously discussed due to her weight.  We will see her for her delivery in the next several weeks.  Gerlene Fee, MD Dept of Anesthesiology

## 2023-03-03 NOTE — Inpatient Diabetes Management (Signed)
Inpatient Diabetes Program Recommendations  AACE/ADA: New Consensus Statement on Inpatient Glycemic Control (2015)  Target Ranges:  Prepandial:   less than 140 mg/dL      Peak postprandial:   less than 180 mg/dL (1-2 hours)      Critically ill patients:  140 - 180 mg/dL   Lab Results  Component Value Date   GLUCAP 160 (H) 03/03/2023   HGBA1C 5.8 (H) 08/13/2022    Review of Glycemic Control  Diabetes history: GDM Outpatient Diabetes medications: glyburide 5 mg QHS Current orders for Inpatient glycemic control: NPH 74 units in am and 24 units QHS, Novolog 0-16 TID after meals and HS, Novolin R 38 units in am and 24 units QPM, glyburide 10 BID  FBS - 91  Inpatient Diabetes Program Recommendations:    Secure text sent to RN regarding teaching pt how to inject insulin using a pen.  Diabetes Coordinator not in hospital on w/e, only on call.  Diabetes Coordinator will see pt on 3/11 regarding her diabetes control and starting insulin at home.   Multiple attempts made to reach pt on phone and was unsuccessful.  Awaiting feedback from RN regarding insulin pen teaching.  No discharge orders yet.  F/U in am.  Thank you. Lorenda Peck, RD, LDN, [place] Inpatient Diabetes Coordinator [phone]

## 2023-03-03 NOTE — Discharge Summary (Signed)
Physician Discharge Summary  Patient ID: Dominique Sanders MRN: [id number] DOB/AGE: 1996-11-27 27 y.o.  Admit date: 03/01/2023 Discharge date: 03/03/2023  Admission Diagnoses:  Discharge Diagnoses:  Principal Problem:   Non-reassuring electronic fetal monitoring tracing Active Problems:   Obesity affecting pregnancy in third trimester   Mild pre-eclampsia in third trimester   Gestational diabetes   Discharged Condition: fair  Hospital Course: The patient was sent to Labor an Delivery for observation secondary to non-reactive NST noted in clinic.  She received additional antenatal testing with a BPP that was 6/8, leaving total testing score of 6/10 (equivocal).  She was also noted to have blood sugars ranging from 200s-300s on Glyburide.  The decision was made to admit for glucose control  and further fetal surveillance.  She was initiated on insulin and dosed until more appropriate blood sugars were achieved. Patient also with history of mild pre-eclampsia, however blood pressures remained within normal limits on no medications during this admission. On HD#2, her BPP was repeated with score 8/8.  Patient was deemed stable for dischaith. She was scheduled for induction of labor at 37 weeks due to uncontrolled GDM and mild pre- Eclampsia .   Consults: None  Significant Diagnostic Studies: labs and radiology: Ultrasound: BPP Results for orders placed or performed during the hospital encounter of 03/01/23  Glucose, capillary  Result Value Ref Range   Glucose-Capillary 238 (H) 70 - 99 mg/dL  Protein / creatinine ratio, urine  Result Value Ref Range   Creatinine, Urine 103 mg/dL   Total Protein, Urine 50 mg/dL   Protein Creatinine Ratio 0.49 (H) 0.00 - 0.15 mg/mg[Cre]  Comprehensive metabolic panel  Result Value Ref Range   Sodium 133 (L) 135 - 145 mmol/L   Potassium 4.1 3.5 - 5.1 mmol/L   Chloride 105 98 - 111 mmol/L   CO2 20 (L) 22 - 32 mmol/L   Glucose, Bld 183 (H) 70 - 99 mg/dL   BUN 8  6 - 20 mg/dL   Creatinine, Ser 0.58 0.44 - 1.00 mg/dL   Calcium 8.9 8.9 - 10.3 mg/dL   Total Protein 6.8 6.5 - 8.1 g/dL   Albumin 2.9 (L) 3.5 - 5.0 g/dL   AST 21 15 - 41 U/L   ALT 20 0 - 44 U/L   Alkaline Phosphatase 73 38 - 126 U/L   Total Bilirubin 0.3 0.3 - 1.2 mg/dL   GFR, Estimated >60 >60 mL/min   Anion gap 8 5 - 15  CBC  Result Value Ref Range   WBC 7.6 4.0 - 10.5 K/uL   RBC 4.29 3.87 - 5.11 MIL/uL   Hemoglobin 12.1 12.0 - 15.0 g/dL   HCT 36.0 36.0 - 46.0 %   MCV 83.9 80.0 - 100.0 fL   MCH 28.2 26.0 - 34.0 pg   MCHC 33.6 30.0 - 36.0 g/dL   RDW 13.6 11.5 - 15.5 %   Platelets 216 150 - 400 K/uL   nRBC 0.0 0.0 - 0.2 %  Beta-hydroxybutyric acid  Result Value Ref Range   Beta-Hydroxybutyric Acid 0.21 0.05 - 0.27 mmol/L  Glucose, capillary  Result Value Ref Range   Glucose-Capillary 158 (H) 70 - 99 mg/dL  Glucose, capillary  Result Value Ref Range   Glucose-Capillary 219 (H) 70 - 99 mg/dL  Glucose, capillary  Result Value Ref Range   Glucose-Capillary 115 (H) 70 - 99 mg/dL  Glucose, capillary  Result Value Ref Range   Glucose-Capillary 198 (H) 70 - 99 mg/dL  Glucose, capillary  Result Value Ref Range   Glucose-Capillary 189 (H) 70 - 99 mg/dL  Glucose, capillary  Result Value Ref Range   Glucose-Capillary 143 (H) 70 - 99 mg/dL  Glucose, capillary  Result Value Ref Range   Glucose-Capillary 172 (H) 70 - 99 mg/dL  Glucose, capillary  Result Value Ref Range   Glucose-Capillary 91 70 - 99 mg/dL  Glucose, capillary  Result Value Ref Range   Glucose-Capillary 160 (H) 70 - 99 mg/dL  Glucose, capillary  Result Value Ref Range   Glucose-Capillary 147 (H) 70 - 99 mg/dL  Glucose, capillary  Result Value Ref Range   Glucose-Capillary 112 (H) 70 - 99 mg/dL  Glucose, capillary  Result Value Ref Range   Glucose-Capillary 182 (H) 70 - 99 mg/dL  Glucose, capillary  Result Value Ref Range   Glucose-Capillary 207 (H) 70 - 99 mg/dL    US OB Limited CLINICAL DATA:   Gestational diabetes  EXAM: BIOPHYSICAL PROFILE  FINDINGS: Number of Fetuses: 1  Heart Rate: 120 bpm  Presentation: Yes  Movement: 2 time: 20 minutes  Breathing: 2  Tone: 2  Amniotic Fluid: 2  Total Score: 8/8  IMPRESSION: Biophysical profile score of 8/8.  Electronically Signed   By: Davina Poke D.O.   On: 03/05/2023 14:29    Treatments: insulin: regular and NPH  Discharge Exam: Blood pressure (!) 143/90, pulse 86, temperature 97.7 F (36.5 C), temperature source Oral, resp. rate 18, height 5\' 2"  (1.575 m), weight (!) 143.8 kg, last menstrual period 06/03/2022. General appearance: alert and no distress Lungs: clear to auscultation bilaterally CVS: Normal S1 and S2, RRR Abdomen:soft, gravid, no tender  and no masses. Extremities:  Nontender, no edema Neurological  Grossly intact    Fetus A Non-Stress Test Interpretation for 03/16/23  Indication: Gestational Diabetes medication controlled, Mild pre-eclampsia, obesity  Fetal Heart Rate A Mode: External Baseline Rate (A): 135 bpm Variability: Moderate Accelerations: 15 x 15 Decelerations: None  Uterine Activity Mode: Palpation, Toco Contraction Frequency (min): none Contraction Duration (sec): 60 Contraction Quality: Mild Resting Tone Palpated: Relaxed Resting Time: Adequate      Disposition: Discharge disposition: 01-Home or Self Care       Discharge Instructions     Discharge patient   Complete by: As directed    After dinner postprandial glucose check, if below 180.   Discharge disposition: 01-Home or Self Care   Discharge patient date: 03/03/2023      Allergies as of 03/03/2023   No Known Allergies      Medication List     TAKE these medications    Accu-Chek Guide test strip Generic drug: glucose blood Check blood sugars 4 x daily (fasting and 2 hours after each meal). What changed: additional instructions   Accu-Chek Guide w/Device Kit 1 kit by Does not apply  route as directed.   Accu-Chek Softclix Lancets lancets Check 4 x daily (fasting and 2 hours after each meal) What changed: additional instructions   ampicillin 500 MG capsule Commonly known as: PRINCIPEN Take 1 capsule (500 mg total) by mouth 4 (four) times daily for 5 days.   aspirin EC 81 MG tablet Take 2 tablets (162 mg total) by mouth daily. Swallow whole.   glyBURIDE 5 MG tablet Commonly known as: DIABETA Take 2 tablets (10 mg total) by mouth 2 (two) times daily with a meal. What changed:  how much to take when to take this   insulin NPH Human 100 UNIT/ML injection Commonly known as: NOVOLIN N  Inject 78 units with breakfast.  Inject 28 units at dinner. . Start taking on: March 04, 2023   insulin regular 100 units/mL injection Commonly known as: NOVOLIN R Inject 38 units at breakfast.  Inject 28 units at dinner.   Prenate Mini 18-0.6-0.4-350 MG Caps Take 1 capsule daily.        Follow-up [facility] at [facility] Follow up.   Specialty: Obstetrics and Gynecology Why: With Dr. Marcelline Mates on 3/13 as scheduled. Contact information: [address] [place] [id number] [phone]                Signed: Rubie Maid, MD [facility] at [facility] 03/03/2023, 7:39 PM

## 2023-03-03 NOTE — Progress Notes (Signed)
Antenatal Progress Note  Subjective:     Patient ID: Dominique Sanders is a 27 y.o. G2P1001  female [redacted]w[redacted]d Estimated Date of Delivery: 04/08/23 dated by with 1st trimester sono at 6 weeks who was admitted for glucose control and non-reassuring antenatal testing. Sent from office for further surveillance due to non-reactive NSTShe has a history of gestational diabetes (noncompliant), and mild pre-eclampsia being monitored outpatient. .Marland KitchenHD# 2.   Subjective:  Patient denies complaints today.  Feeling pretty good.   Review of Systems Denies contractions, leakage of fluids, vaginal bleeding, and reports good fetal movement.     Objective:   Vitals:   03/02/23 2351 03/03/23 0331 03/03/23 0713 03/03/23 0732  BP: 133/74 128/76 139/80 105/88  Pulse: 90 80 90 87  Resp:   19 18  Temp:  97.9 F (36.6 C) 97.8 F (36.6 C) 97.7 F (36.5 C)  TempSrc:  Oral Oral Oral  Weight:      Height:        General appearance: alert, no distress, and morbidly obese Lungs: clear to auscultation bilaterally Heart: regular rate and rhythm, S1, S2 normal, no murmur, click, rub or gallop Abdomen: soft, non-tender; bowel sounds normal; no masses,  no organomegaly and gravid Pelvic: deferred Extremities: extremities normal, atraumatic, no cyanosis or edema   Fetal Monitoring Interpretation   Indication: Unsatisfactory BPP and Gestational DM (uncontrolled)  Fetal Heart Rate A Mode: External Baseline Rate (A): 125 bpm Variability: Moderate Accelerations: 10 x 10 Decelerations: None  Uterine Activity Mode: Palpation, Toco Contraction Frequency (min): none noted Contraction Duration (sec): 90 Contraction Quality: Mild Resting Tone Palpated: Relaxed Resting Time: Adequate    Labs:  CBC Lab Results  Component Value Date   WBC 7.6 03/01/2023   HGB 12.1 03/01/2023   HCT 36.0 03/01/2023   MCV 83.9 03/01/2023   PLT 216 03/01/2023    CMP     Component Value Date/Time   NA 133 (L) 03/01/2023 1854    NA 136 02/20/2023 1528   K 4.1 03/01/2023 1854   CL 105 03/01/2023 1854   CO2 20 (L) 03/01/2023 1854   GLUCOSE 183 (H) 03/01/2023 1854   BUN 8 03/01/2023 1854   BUN 9 02/20/2023 1528   CREATININE 0.58 03/01/2023 1854   CALCIUM 8.9 03/01/2023 1854   PROT 6.8 03/01/2023 1854   PROT 6.6 02/20/2023 1528   ALBUMIN 2.9 (L) 03/01/2023 1854   ALBUMIN 3.6 (L) 02/20/2023 1528   AST 21 03/01/2023 1854   ALT 20 03/01/2023 1854   ALKPHOS 73 03/01/2023 1854   BILITOT 0.3 03/01/2023 1854   BILITOT 0.2 02/20/2023 1528   GFRNONAA >60 03/01/2023 1854   GFRAA 134 01/19/2020 1456      Latest Reference Range & Units 03/01/23 18:40  Protein Creatinine Ratio 0.00 - 0.15 mg/mgCre 0.49 (H)  (H): Data is abnormally high    Latest Reference Range & Units 03/02/23 08:16 03/02/23 11:29 03/02/23 15:24 03/02/23 17:41 03/02/23 20:42 03/03/23 07:33  Glucose-Capillary 70 - 99 mg/dL 115 (H) 198 (H) 189 (H) 143 (H) 172 (H) 91  (H): Data is abnormally high   Imaging:  [place]OB Limited CLINICAL DATA:  Gestational diabetes, nonreactive nonstress test  EXAM: BIOPHYSICAL PROFILE  FINDINGS: Number of Fetuses: 1  Heart Rate: 143 bpm  Presentation: Cephalic  Movement: 2 time: 30 minutes  Breathing: 0  Tone: 2  Amniotic Fluid: 2  Total Score: 6/8  IMPRESSION: Biophysical profile score of 6/8. No breathing episodes were reported by the technologist  during the 30 minute exam.  These results will be called to the ordering clinician or representative by the Radiologist Assistant, and communication documented in the PACS or [employer].  Electronically Signed   By: Davina Poke D.O.   On: 03/01/2023 18:35 US FETAL BPP WO NON STRESS CLINICAL DATA:  Gestational diabetes, nonreactive nonstress test  EXAM: BIOPHYSICAL PROFILE  FINDINGS: Number of Fetuses: 1  Heart Rate: 143 bpm  Presentation: Cephalic  Movement: 2 time: 30 minutes  Breathing: 0  Tone: 2  Amniotic Fluid:  2  Total Score: 6/8  IMPRESSION: Biophysical profile score of 6/8. No breathing episodes were reported by the technologist during the 30 minute exam.  These results will be called to the ordering clinician or representative by the Radiologist Assistant, and communication documented in the PACS or [employer].  Electronically Signed   By: Davina Poke D.O.   On: 03/01/2023 18:35    MFM Growth Scan ----------------------------------------------------------------------  OBSTETRICS REPORT                       (Signed Final 02/21/2023 04:36 pm) ---------------------------------------------------------------------- Patient Info  ID #:       [id number]                          D.O.B.:  June 22, 1996 (26 yrs)  Name:       Dominique Sanders                      Visit Date: 02/21/2023 03:16 pm ---------------------------------------------------------------------- Performed By  Attending:        Johnell Comings MD         Ref. Address:     [place]                                                             [place]  Performed By:     Rodrigo Ran BS      [facility]                                 [facility] at  [facility]  Referred By:      Herma Carson ---------------------------------------------------------------------- Orders  #  Description                           Code        Ordered By  1  [place] MFM OB DETAIL +14 [place]               76811.01    MELISSA SWANSON  2  [place] MFM FETAL BPP WO NON               76819.01    MELISSA SWANSON     STRESS ----------------------------------------------------------------------  #  Order #                     Accession #                Episode #  1  [id number]                   [id number]                  [id number]  2  [id number]                   [id number]                 [id number] ---------------------------------------------------------------------- Indications  Gestational diabetes in pregnancy,             O24.414  insulin/glyburide  Obesity complicating pregnancy, third          O99.213  trimester  Antenatal screening for malformations          Z36.3  Pre-eclampsia                                  O14.90  Low risk NIPS, neg AFP  [redacted] weeks gestation of pregnancy                Z3A.33 ---------------------------------------------------------------------- Fetal Evaluation  Num Of Fetuses:         1  Cardiac Activity:       Observed  Presentation:           Cephalic  Placenta:               Posterior  P. Cord Insertion:      Not well visualized  Amniotic Fluid  AFI FV:      Within normal limits  AFI Sum(cm)     %Tile       Largest Pocket(cm)  16.77           61          6.33  RUQ(cm)       RLQ(cm)       LUQ(cm)        LLQ(cm)  1.92          4.27          4.25           6.33 ---------------------------------------------------------------------- Biophysical Evaluation  Amniotic F.V:   Within normal limits       F. Tone:        Observed  F. Movement:    Observed                   Score:  8/8  F. Breathing:   Observed ---------------------------------------------------------------------- Biometry  BPD:     80.97  mm     G. Age:  32w 4d         20  %    CI:        71.44   %    70 - 86                                                          FL/HC:      21.0   %    19.9 - 21.5  HC:    305.07   mm     G. Age:  34w 0d         26  %    HC/AC:      0.97        0.96 - 1.11  AC:    313.16   mm     G. Age:  35w 2d         93  %    FL/BPD:     79.0   %    71 - 87  FL:      63.93  mm     G. Age:  33w 0d         28  %    FL/AC:      20.4   %    20 - 24  CER:      39.6  mm     G. Age:  32w 1d         10  %  LV:        3.4  mm  Est. FW:    2382  gm      5 lb 4 oz     67   % ---------------------------------------------------------------------- OB History  Gravidity:    2         Term:   1        Prem:   0        SAB:   0  TOP:          0       Ectopic:  0        Living: 1 ---------------------------------------------------------------------- Gestational Age  LMP:           37w 4d        Date:  06/03/22                  EDD:   03/10/23  U/S Today:     33w 5d                                        EDD:   04/06/23  Best:          33w 3d     Det. ByLoman Chroman         EDD:   04/08/23                                      (08/13/22) ---------------------------------------------------------------------- Anatomy  Cranium:  Appears normal         Aortic Arch:            Not well visualized  Cavum:                 Appears normal         Ductal Arch:            Not well visualized  Ventricles:            Appears normal         Diaphragm:              Appears normal  Choroid Plexus:        Appears normal         Stomach:                Appears normal, left                                                                        sided  Cerebellum:            Appears normal         Abdomen:                Appears normal  Posterior Fossa:       Appears normal         Abdominal Wall:         Not well visualized  Nuchal Fold:           Not applicable ([id number]    Cord Vessels:           Not well visualized                         wks GA)  Face:                  Appears normal         Kidneys:                Appear normal                         (orbits and profile)  Lips:                  Appears normal         Bladder:                Appears normal  Thoracic:              Appears normal         Spine:                  Limited views                                                                        appear normal  Heart:  Appears normal         Upper Extremities:      LUE appears                         (4CH, axis, and                                 normal; RUE nwv                         situs)  RVOT:                  Not well visualized    Lower Extremities:      Appears normal  LVOT:                  Not well visualized  Other:  Fetus appears to be female. Lenses and nasal bone visualized.          Technically difficult due to maternal habitus and fetal position. ---------------------------------------------------------------------- Cervix Uterus Adnexa  Cervix  Not visualized (advanced GA >24wks)  Uterus  Single, anterior fibroid noted  Right Ovary  Not visualized.  Left Ovary  Not visualized.  Cul De Sac  No free fluid seen.  Adnexa  No abnormality visualized ---------------------------------------------------------------------- Comments  This patient was seen for an ultrasound exam due to  maternal obesity with a BMI of 58 and recently diagnosed  gestational diabetes that is treated with glyburide 5 mg twice  a day.  She reports that her glycemic control is "okay".  She was informed that the fetal growth and amniotic fluid  level were appropriate for her gestational age.  A BPP performed today was 8 out of 8.  The views of the fetal anatomy were limited today due to her  advanced gestational age.  The patient was informed that anomalies may be missed due  to technical limitations. If the fetus is in a suboptimal position  or maternal habitus is increased, visualization of the fetus in  the maternal uterus may be impaired.  Due to gestational diabetes, she should continue weekly fetal  testing until delivery.  The patient reports that she already has  the weekly NSTs scheduled in your office.  Should her glycemic control remain poor, delivery should be  considered at between 37 to 38 weeks.  A follow-up growth scan and BPP was scheduled in our office  in 4 weeks. ----------------------------------------------------------------------                   Johnell Comings, MD Electronically Signed Final Report    02/21/2023 04:36 pm  Assessment:  27 y.o.  G2P1001 female [redacted]w[redacted]d with:   Non-reassuring antenatal testing Gestational diabetes, uncontrolled Mild pre-eclampsia in third trimester Morbid obesity (BMI 57.98)  Plan:   Non-reassuring antenatal testing - Patient with non-reassuring NST in office Friday, BPP performed Friday 6/8 (2 off for breathing).  Has been on continuous monitoring since admission with more reassuring fetal tracing. Will repeat BPP today.   Gestational diabetes, uncontrolled - Currently restarted Glyburide, up to 10 mg BID.  Blood sugars still uncontrolled during the day, changed regimen to NPH 74 AM/24 PM, R 38 AM/24 PM. Will continue to titrate based on blood sugars today. Most recent growth scan 2 weeks ago noting growth at 67%ile (although AC at [place]. Will plan for delivery at 37 weeks.  -  Seen by  Diabetes coordinator yesterday for insulin education. Reiterated dietary adherence.  Mild pre-eclampsia in third trimester - BPs currently well controlled on no meds. Still with mild proteinuria. Will continue to monitor with weekly labs. Plan for delivery at 1. Scheduled for 3/27 at midnight.   Morbid obesity (BMI 57.98) - Patient has had first anesthesia consult. Will get second consult while inpatient. Patient aware of Anesthesia BMI cut-off of 60 which will require transfer to tertiary facility.   Will plan for d/c later today if blood sugars more reasonably controlled.   A total of 40 minutes were spent during this encounter, including review of previous progress notes, recent imaging and labs, face-to-face with time with patient involving counseling and coordination of care, as well as documentation for current visit.  Rubie Maid, MD [facility] at [facility]

## 2023-03-04 ENCOUNTER — Other Ambulatory Visit: Payer: Self-pay | Admitting: Obstetrics and Gynecology

## 2023-03-04 ENCOUNTER — Telehealth: Payer: Self-pay

## 2023-03-05 ENCOUNTER — Other Ambulatory Visit: Payer: Self-pay | Admitting: Obstetrics and Gynecology

## 2023-03-05 ENCOUNTER — Encounter: Payer: Self-pay | Admitting: Obstetrics and Gynecology

## 2023-03-05 DIAGNOSIS — O368390 Maternal care for abnormalities of the fetal heart rate or rhythm, unspecified trimester, not applicable or unspecified: Secondary | ICD-10-CM

## 2023-03-05 DIAGNOSIS — O24419 Gestational diabetes mellitus in pregnancy, unspecified control: Secondary | ICD-10-CM

## 2023-03-05 NOTE — Telephone Encounter (Signed)
Contacted pharmacy who will change to covered alternative.

## 2023-03-06 ENCOUNTER — Ambulatory Visit (INDEPENDENT_AMBULATORY_CARE_PROVIDER_SITE_OTHER): Payer: Medicaid Other | Admitting: Obstetrics and Gynecology

## 2023-03-06 ENCOUNTER — Encounter: Payer: Self-pay | Admitting: Obstetrics and Gynecology

## 2023-03-06 VITALS — BP 118/84 | HR 117 | Wt 322.2 lb

## 2023-03-06 DIAGNOSIS — O99213 Obesity complicating pregnancy, third trimester: Secondary | ICD-10-CM

## 2023-03-06 DIAGNOSIS — O1403 Mild to moderate pre-eclampsia, third trimester: Secondary | ICD-10-CM

## 2023-03-06 DIAGNOSIS — Z794 Long term (current) use of insulin: Secondary | ICD-10-CM

## 2023-03-06 DIAGNOSIS — O0993 Supervision of high risk pregnancy, unspecified, third trimester: Secondary | ICD-10-CM

## 2023-03-06 DIAGNOSIS — Z3A35 35 weeks gestation of pregnancy: Secondary | ICD-10-CM

## 2023-03-06 DIAGNOSIS — O24419 Gestational diabetes mellitus in pregnancy, unspecified control: Secondary | ICD-10-CM

## 2023-03-06 LAB — POCT URINALYSIS DIPSTICK OB
Bilirubin, UA: NEGATIVE
Glucose, UA: NEGATIVE
Ketones, UA: NEGATIVE
Nitrite, UA: NEGATIVE
Spec Grav, UA: 1.02 (ref 1.010–1.025)
Urobilinogen, UA: 0.2 E.U./dL
pH, UA: 6 (ref 5.0–8.0)

## 2023-03-06 NOTE — Progress Notes (Signed)
ROB: Patient is a 27 y.o. G2P1001 at 13w2dwho presents for high risk OB Care. Pregnancy complicated by mild pre-eclampsia, morbid obesity, gestational DM, recently initiated on insulin over the weekend after admission for glucose control.  Patient reports that she has been monitoring her blood sugars, fastings and postprandial breakfast and lunch are mostly controlled, occasional mild elevation noted, however postprandial dinners are still majority elevated, ranging from 130s-240s, depending on what she consumes. Notes she is still taking the dose she was started on in the hospital, instead of the new dose as her needles don't hold that much insulin at a time. Advised to increase her current regimen evening NPH by 2 units, and evening Regular insulin by 6 units. Continue to encourage dietary compliance.   For NST on Friday, will perform at the hospital until delivery.  Delivery scheduled for 03/20/2023 at midnight. Repeat Anesthesia consult recently performed while inpatient.  UTI recently diagnosed, prescribed Ampicillin. RTC in 1 week.

## 2023-03-06 NOTE — Progress Notes (Signed)
ROB 105w2d She is doing well. She reports good fetal movement. She has been having some Braxton Hick's Contractions.

## 2023-03-07 ENCOUNTER — Inpatient Hospital Stay: Admission: RE | Admit: 2023-03-07 | Payer: Medicaid Other | Source: Ambulatory Visit

## 2023-03-07 NOTE — Addendum Note (Signed)
Addended by: Augusto Gamble on: 03/07/2023 10:33 AM   Modules accepted: Orders

## 2023-03-12 ENCOUNTER — Other Ambulatory Visit (HOSPITAL_COMMUNITY)
Admission: RE | Admit: 2023-03-12 | Discharge: 2023-03-12 | Disposition: A | Discharge status: Home / Self Care | Payer: Medicaid Other | Source: Ambulatory Visit | Attending: Obstetrics and Gynecology | Admitting: Obstetrics and Gynecology

## 2023-03-12 ENCOUNTER — Encounter: Payer: Self-pay | Admitting: Obstetrics and Gynecology

## 2023-03-12 ENCOUNTER — Ambulatory Visit (INDEPENDENT_AMBULATORY_CARE_PROVIDER_SITE_OTHER): Payer: Medicaid Other | Admitting: Obstetrics and Gynecology

## 2023-03-12 ENCOUNTER — Other Ambulatory Visit: Payer: Self-pay | Admitting: Obstetrics and Gynecology

## 2023-03-12 VITALS — BP 117/55 | HR 95 | Wt 323.3 lb

## 2023-03-12 DIAGNOSIS — Z3A36 36 weeks gestation of pregnancy: Secondary | ICD-10-CM | POA: Insufficient documentation

## 2023-03-12 DIAGNOSIS — Z113 Encounter for screening for infections with a predominantly sexual mode of transmission: Secondary | ICD-10-CM | POA: Diagnosis present

## 2023-03-12 DIAGNOSIS — Z3685 Encounter for antenatal screening for Streptococcus B: Secondary | ICD-10-CM

## 2023-03-12 DIAGNOSIS — O0993 Supervision of high risk pregnancy, unspecified, third trimester: Secondary | ICD-10-CM | POA: Diagnosis not present

## 2023-03-12 DIAGNOSIS — O99213 Obesity complicating pregnancy, third trimester: Secondary | ICD-10-CM

## 2023-03-12 DIAGNOSIS — O24414 Gestational diabetes mellitus in pregnancy, insulin controlled: Secondary | ICD-10-CM

## 2023-03-12 DIAGNOSIS — O1403 Mild to moderate pre-eclampsia, third trimester: Secondary | ICD-10-CM

## 2023-03-12 DIAGNOSIS — O2343 Unspecified infection of urinary tract in pregnancy, third trimester: Secondary | ICD-10-CM

## 2023-03-12 LAB — POCT URINALYSIS DIPSTICK OB
Bilirubin, UA: NEGATIVE
Blood, UA: NEGATIVE
Ketones, UA: NEGATIVE
Nitrite, UA: NEGATIVE
Spec Grav, UA: 1.015 (ref 1.010–1.025)
Urobilinogen, UA: 0.2 E.U./dL
pH, UA: 6.5 (ref 5.0–8.0)

## 2023-03-12 NOTE — Progress Notes (Signed)
ROB: Patient is a 27 y.o. G2P1001 at [redacted]w[redacted]d who presents for high risk OB Care. Pregnancy complicated by mild pre-eclampsia, morbid obesity, gestational DM, initiated on insulin 2 weeks ag. She reports that she has not been as compliant with her diet or monitoring her blood sugars since her baby shower this weekend.   Levels have ranged between 120s -180s (fasting or postprandial).  For NST on Friday, will perform at the hospital until delivery.  Delivery scheduled for 03/20/2023 at midnight. For TOC for UTI at induction. 36 week cultures performed today (self-swab performed).

## 2023-03-12 NOTE — Progress Notes (Signed)
ROB [redacted]w[redacted]d: She is doing well. She reports good fetal movement. Cultures done today. She has no new concerns.

## 2023-03-13 ENCOUNTER — Encounter: Payer: Self-pay | Admitting: Obstetrics

## 2023-03-14 LAB — CERVICOVAGINAL ANCILLARY ONLY
Chlamydia: NEGATIVE
Comment: NEGATIVE
Comment: NORMAL
Neisseria Gonorrhea: NEGATIVE

## 2023-03-14 LAB — STREP GP B NAA: Strep Gp B NAA: POSITIVE — AB

## 2023-03-14 NOTE — Addendum Note (Signed)
Addended by: Augusto Gamble on: 03/14/2023 03:52 PM   Modules accepted: Orders

## 2023-03-15 ENCOUNTER — Observation Stay
Admission: RE | Admit: 2023-03-15 | Discharge: 2023-03-15 | Disposition: A | Discharge status: Home / Self Care | Payer: Medicaid Other | Attending: Obstetrics and Gynecology | Admitting: Obstetrics and Gynecology

## 2023-03-15 ENCOUNTER — Other Ambulatory Visit: Payer: Self-pay

## 2023-03-15 ENCOUNTER — Encounter: Payer: Self-pay | Admitting: Obstetrics and Gynecology

## 2023-03-15 DIAGNOSIS — Z79899 Other long term (current) drug therapy: Secondary | ICD-10-CM | POA: Insufficient documentation

## 2023-03-15 DIAGNOSIS — O0993 Supervision of high risk pregnancy, unspecified, third trimester: Secondary | ICD-10-CM

## 2023-03-15 DIAGNOSIS — O1403 Mild to moderate pre-eclampsia, third trimester: Secondary | ICD-10-CM | POA: Diagnosis not present

## 2023-03-15 DIAGNOSIS — Z7982 Long term (current) use of aspirin: Secondary | ICD-10-CM | POA: Diagnosis not present

## 2023-03-15 DIAGNOSIS — Z3A36 36 weeks gestation of pregnancy: Secondary | ICD-10-CM | POA: Insufficient documentation

## 2023-03-15 DIAGNOSIS — Z794 Long term (current) use of insulin: Secondary | ICD-10-CM | POA: Insufficient documentation

## 2023-03-15 DIAGNOSIS — Z3689 Encounter for other specified antenatal screening: Secondary | ICD-10-CM | POA: Diagnosis present

## 2023-03-15 DIAGNOSIS — O24414 Gestational diabetes mellitus in pregnancy, insulin controlled: Secondary | ICD-10-CM | POA: Diagnosis not present

## 2023-03-15 DIAGNOSIS — O1400 Mild to moderate pre-eclampsia, unspecified trimester: Secondary | ICD-10-CM

## 2023-03-15 DIAGNOSIS — O99213 Obesity complicating pregnancy, third trimester: Secondary | ICD-10-CM

## 2023-03-15 LAB — PROTEIN / CREATININE RATIO, URINE
Creatinine, Urine: 141 mg/dL
Protein Creatinine Ratio: 0.46 mg/mg{Cre} — ABNORMAL HIGH (ref 0.00–0.15)
Total Protein, Urine: 65 mg/dL

## 2023-03-15 LAB — COMPREHENSIVE METABOLIC PANEL
ALT: 15 U/L (ref 0–44)
AST: 18 U/L (ref 15–41)
Albumin: 2.7 g/dL — ABNORMAL LOW (ref 3.5–5.0)
Alkaline Phosphatase: 78 U/L (ref 38–126)
Anion gap: 8 (ref 5–15)
BUN: 12 mg/dL (ref 6–20)
CO2: 22 mmol/L (ref 22–32)
Calcium: 9.6 mg/dL (ref 8.9–10.3)
Chloride: 107 mmol/L (ref 98–111)
Creatinine, Ser: 0.72 mg/dL (ref 0.44–1.00)
GFR, Estimated: >60 mL/min (ref >60)
Glucose, Bld: 147 mg/dL — ABNORMAL HIGH (ref 70–99)
Potassium: 4.3 mmol/L (ref 3.5–5.1)
Sodium: 137 mmol/L (ref 135–145)
Total Bilirubin: 0.1 mg/dL — ABNORMAL LOW (ref 0.3–1.2)
Total Protein: 6.9 g/dL (ref 6.5–8.1)

## 2023-03-15 LAB — CBC
HCT: 37.4 % (ref 36.0–46.0)
Hemoglobin: 12.4 g/dL (ref 12.0–15.0)
MCH: 28.6 pg (ref 26.0–34.0)
MCHC: 33.2 g/dL (ref 30.0–36.0)
MCV: 86.2 fL (ref 80.0–100.0)
Platelets: 193 10*3/uL (ref 150–400)
RBC: 4.34 MIL/uL (ref 3.87–5.11)
RDW: 13.8 % (ref 11.5–15.5)
WBC: 8.3 10*3/uL (ref 4.0–10.5)
nRBC: 0 % (ref 0.0–0.2)

## 2023-03-15 LAB — GLUCOSE, CAPILLARY
Glucose-Capillary: 194 mg/dL — ABNORMAL HIGH (ref 70–99)
Glucose-Capillary: 170 mg/dL — ABNORMAL HIGH (ref 70–99)

## 2023-03-15 MED ORDER — INSULIN ASPART 100 UNIT/ML IJ SOLN
38.0000 [IU] | Freq: Once | INTRAMUSCULAR | Status: AC
  Administered 2023-03-15: 38 [IU] via SUBCUTANEOUS
  Filled 2023-03-15: qty 1

## 2023-03-15 MED ORDER — INSULIN NPH (HUMAN) (ISOPHANE) 100 UNIT/ML ~~LOC~~ SUSP
78.0000 [IU] | Freq: Once | SUBCUTANEOUS | Status: AC
  Administered 2023-03-15: 78 [IU] via SUBCUTANEOUS
  Filled 2023-03-15: qty 10

## 2023-03-15 MED ORDER — NIFEDIPINE ER OSMOTIC RELEASE 30 MG PO TB24
30.0000 mg | ORAL_TABLET | Freq: Every day | ORAL | Status: DC
  Administered 2023-03-15: 30 mg via ORAL
  Filled 2023-03-15: qty 1

## 2023-03-15 NOTE — Progress Notes (Signed)
Pt presents to L/D triage for scheduled NST from home. She reports no OB concerns at this time- no bleeding, LOF, noted contractions, and positive fetal movement. Monitors applied and assessing. Initial FHT 145. BP 138/81. VSS. Pt reports no breakfast or insulin this morning- glyburide taken. Fasting CBG 194. Pt reports not taking full prescribed dose of insulin due to not being able to fit dose in her syringe size. MD made aware. Cold water given.   MD reviewed strip  and CBG. Order to give SQ insulin and carb modified diet. Will recheck CBG 2hr postprandial.

## 2023-03-15 NOTE — OB Triage Note (Signed)
Pt discharged home per order.  Pt stable and ambulatory and an After Visit Summary was printed and given to the patient. Discharge education completed with patient/family including follow up instructions, appointments, and medication list. Pt received labor and bleeding precautions. Patient able to verbalize understanding, all questions fully answered upon discharge. Patient instructed to return to ED, call 911, or call MD for any changes in condition.  Procardia XL given per MD. MD aware of labs and blood pressures.

## 2023-03-15 NOTE — Final Progress Note (Signed)
L&D OB Triage Note  HPI:  Dominique Sanders is a 27 y.o. G2P1001 female at [redacted]w[redacted]d. Estimated Date of Delivery: 04/08/23 who presents for scheduled NST    Her pregnancy is complicated by uncontrolled gestational DM recently  initiated on insulin,  pre- Eclampsia with out  severe features, and morbid obesity. She denies contractions, vaginal bleeding, leaking fluids, and endorses good fetal movement.    OB History  Gravida Para Term Preterm AB Living  2 1 1     1   SAB IAB Ectopic Multiple Live Births          1    # Outcome Date GA Lbr Len/2nd Weight Sex Delivery Anes PTL Lv  2 Current           1 Term 01/2021 [redacted]w[redacted]d  3459 g  Vag-Spont   LIV    Patient Active Problem List   Diagnosis Date Noted   Gestational diabetes requiring insulin 03/15/2023   Non-reassuring electronic fetal monitoring tracing 03/02/2023   Labor and delivery, indication for care 03/01/2023   Gestational diabetes 03/01/2023   Vaginal bleeding in pregnancy, third trimester 02/26/2023   [redacted] weeks gestation of pregnancy 02/26/2023   Elevated blood pressure affecting pregnancy in third trimester, antepartum 02/14/2023   Gestational diabetes mellitus (GDM) in third trimester 02/14/2023   Trichomonal vaginitis during pregnancy in first trimester 09/12/2022   History of pre-eclampsia 09/06/2022   Supervision of high-risk pregnancy 09/06/2022   Mild pre-eclampsia in third trimester 01/28/2020   History of gestational diabetes 10/15/2019   Obesity affecting pregnancy in third trimester 08/11/2019   BMI 50.0-59.9, adult ([place]) 08/11/2019    Past Medical History:  Diagnosis Date   Diabetes mellitus without complication ([place])    History of gestational diabetes 10/15/2019   Preeclampsia 01/08/2020   Results for orders placed or performed during the hospital encounter of 01/07/20 (from the past 24 hour(s)) Urinalysis, Routine w reflex microscopic     Status: Abnormal  Collection Time: 01/07/20 11:20 PM Result Value Ref Range  Color,  Urine STRAW (A) YELLOW  APPearance CLEAR CLEAR  Specific Gravity, Urine 1.009 1.005 - 1.030  pH 7.0 5.0 - 8.0  Glucose, UA >=500 (A) NEGATIVE mg/dL  Hgb urine dips    No current facility-administered medications on file prior to encounter.   Current Outpatient Medications on File Prior to Encounter  Medication Sig Dispense Refill   Accu-Chek Softclix Lancets lancets Check 4 x daily (fasting and 2 hours after each meal) 100 each 6   aspirin EC 81 MG tablet Take 2 tablets (162 mg total) by mouth daily. Swallow whole. 300 tablet 3   Blood Glucose Monitoring Suppl (ACCU-CHEK GUIDE) w/Device KIT 1 kit by Does not apply route as directed. 1 kit 0   glucose blood (ACCU-CHEK GUIDE) test strip Check blood sugars 4 x daily (fasting and 2 hours after each meal). 100 each 12   glyBURIDE (DIABETA) 5 MG tablet Take 2 tablets (10 mg total) by mouth 2 (two) times daily with a meal. 180 tablet 1   insulin NPH Human (NOVOLIN N) 100 UNIT/ML injection Inject 78 units with breakfast.  Inject 28 units at dinner. . 10 mL 11   insulin regular (NOVOLIN R) 100 units/mL injection INJECT 38 UNITS AT BREAKFAST. INJECT 28 UNITS AT DINNER. 10 mL 11   Prenat-FeCbn-FeAsp-Meth-FA-DHA (PRENATE MINI) 18-0.6-0.4-350 MG CAPS Take 1 capsule daily. 30 capsule 11    No Known Allergies   ROS:  Review of Systems - Negative except what  is noted in HPI.   Physical Exam:  Blood pressure 138/81, pulse 88, temperature 98.3 F (36.8 C), resp. rate 18, height 5\' 2"  (1.575 m), weight (!) 146.5 kg, last menstrual period 06/03/2022. General appearance: alert Abdomen: soft, non-tender; bowel sounds normal; no masses,  no organomegaly Pelvic: deferred Extremities: extremities normal, atraumatic, no cyanosis or edema   NST INTERPRETATION: Indications: gestational diabetes mellitus and Mild pre-eclampsia, and obesity  Mode: External Baseline Rate (A): 135 bpm Variability: Moderate Accelerations: 15 x 15 Decelerations: None      Contraction Frequency (min): 3-4  Impression: reactive Labs: Results for orders placed or performed during the hospital encounter of 03/15/23  Glucose, capillary  Result Value Ref Range   Glucose-Capillary 194 (H) 70 - 99 mg/dL  Glucose, capillary  Result Value Ref Range   Glucose-Capillary 170 (H) 70 - 99 mg/dL  Protein / creatinine ratio, urine  Result Value Ref Range   Creatinine, Urine 141 mg/dL   Total Protein, Urine 65 mg/dL   Protein Creatinine Ratio 0.46 (H) 0.00 - 0.15 mg/mg[Cre]  CBC  Result Value Ref Range   WBC 8.3 4.0 - 10.5 K/uL   RBC 4.34 3.87 - 5.11 MIL/uL   Hemoglobin 12.4 12.0 - 15.0 g/dL   HCT 37.4 36.0 - 46.0 %   MCV 86.2 80.0 - 100.0 fL   MCH 28.6 26.0 - 34.0 pg   MCHC 33.2 30.0 - 36.0 g/dL   RDW 13.8 11.5 - 15.5 %   Platelets 193 150 - 400 K/uL   nRBC 0.0 0.0 - 0.2 %  Comprehensive metabolic panel  Result Value Ref Range   Sodium 137 135 - 145 mmol/L   Potassium 4.3 3.5 - 5.1 mmol/L   Chloride 107 98 - 111 mmol/L   CO2 22 22 - 32 mmol/L   Glucose, Bld 147 (H) 70 - 99 mg/dL   BUN 12 6 - 20 mg/dL   Creatinine, Ser 0.72 0.44 - 1.00 mg/dL   Calcium 9.6 8.9 - 10.3 mg/dL   Total Protein 6.9 6.5 - 8.1 g/dL   Albumin 2.7 (L) 3.5 - 5.0 g/dL   AST 18 15 - 41 U/L   ALT 15 0 - 44 U/L   Alkaline Phosphatase 78 38 - 126 U/L   Total Bilirubin 0.1 (L) 0.3 - 1.2 mg/dL   GFR, Estimated >60 >60 mL/min   Anion gap 8 5 - 15    Assessment/Plan:  27 y.o. G2P1001 at [redacted]w[redacted]d with:   1.  Gestational Diabetes - uncontrolled on insulin. Discussion had with patient regarding elevated blood sugars. Notes that she has not taken her insulin in several days since her baby shower.  Also notes not giving herself the full dosing of insulin as prescribed as her needles don't hold as much (currently giving herself 1/2-2/3 the  prescribed dosing. Strongly stressed the importance of following prescribed insulin regimen and dietary compliance. Patient notes understanding.  - Fetal  tracing initially Category II as period of minimal variability and 1 deceleration noted, however with extended monitoring and PO intake, fetus became Category I.   2. Pre-eclampsia without severe features -  Patient with elevated blood pressures at initial time of discharge, monitored, and treated with dose of Procardia XL 30 mg.  Labs still how only mild proteinuria. Patient remained asymptomatic.  Will treat with Procardia for the next week until scheduled induction next week  3. Preterm uterine contractions - Undetectable to patient. Reports that she has not had anything to eat or  drink today. Given ice water initially due to fetal tracing and minimal variability, then allowed to eat, with spacing out of contractions.   Patient scheduled for IOL on March 27th.     Rubie Maid, MD [facility] at [facility]

## 2023-03-16 ENCOUNTER — Other Ambulatory Visit: Payer: Self-pay | Admitting: Obstetrics and Gynecology

## 2023-03-16 MED ORDER — NIFEDIPINE ER OSMOTIC RELEASE 30 MG PO TB24: 30.0000 mg | ORAL_TABLET | Freq: Every day | ORAL | 0 refills | Status: DC

## 2023-03-20 ENCOUNTER — Other Ambulatory Visit: Payer: Medicaid Other

## 2023-03-20 ENCOUNTER — Encounter: Payer: Self-pay | Admitting: Obstetrics and Gynecology

## 2023-03-20 ENCOUNTER — Inpatient Hospital Stay: Payer: Medicaid Other | Admitting: Anesthesiology

## 2023-03-20 ENCOUNTER — Inpatient Hospital Stay
Admission: EM | Admit: 2023-03-20 | Discharge: 2023-03-23 | DRG: 807 | Disposition: A | Discharge status: Home / Self Care | Payer: Medicaid Other | Attending: Obstetrics and Gynecology | Admitting: Obstetrics and Gynecology

## 2023-03-20 ENCOUNTER — Other Ambulatory Visit: Payer: Self-pay

## 2023-03-20 DIAGNOSIS — O0993 Supervision of high risk pregnancy, unspecified, third trimester: Secondary | ICD-10-CM

## 2023-03-20 DIAGNOSIS — O24419 Gestational diabetes mellitus in pregnancy, unspecified control: Secondary | ICD-10-CM | POA: Diagnosis present

## 2023-03-20 DIAGNOSIS — Z7982 Long term (current) use of aspirin: Secondary | ICD-10-CM

## 2023-03-20 DIAGNOSIS — Z3A37 37 weeks gestation of pregnancy: Secondary | ICD-10-CM

## 2023-03-20 DIAGNOSIS — O2343 Unspecified infection of urinary tract in pregnancy, third trimester: Secondary | ICD-10-CM

## 2023-03-20 DIAGNOSIS — O1400 Mild to moderate pre-eclampsia, unspecified trimester: Secondary | ICD-10-CM

## 2023-03-20 DIAGNOSIS — O1404 Mild to moderate pre-eclampsia, complicating childbirth: Secondary | ICD-10-CM | POA: Diagnosis present

## 2023-03-20 DIAGNOSIS — B951 Streptococcus, group B, as the cause of diseases classified elsewhere: Secondary | ICD-10-CM

## 2023-03-20 DIAGNOSIS — Z3685 Encounter for antenatal screening for Streptococcus B: Secondary | ICD-10-CM

## 2023-03-20 DIAGNOSIS — O24414 Gestational diabetes mellitus in pregnancy, insulin controlled: Secondary | ICD-10-CM | POA: Diagnosis not present

## 2023-03-20 DIAGNOSIS — O99820 Streptococcus B carrier state complicating pregnancy: Secondary | ICD-10-CM | POA: Diagnosis not present

## 2023-03-20 DIAGNOSIS — O24424 Gestational diabetes mellitus in childbirth, insulin controlled: Principal | ICD-10-CM | POA: Diagnosis present

## 2023-03-20 DIAGNOSIS — Z113 Encounter for screening for infections with a predominantly sexual mode of transmission: Secondary | ICD-10-CM

## 2023-03-20 DIAGNOSIS — O1403 Mild to moderate pre-eclampsia, third trimester: Secondary | ICD-10-CM

## 2023-03-20 DIAGNOSIS — O99214 Obesity complicating childbirth: Secondary | ICD-10-CM | POA: Diagnosis present

## 2023-03-20 DIAGNOSIS — Z3A36 36 weeks gestation of pregnancy: Secondary | ICD-10-CM

## 2023-03-20 DIAGNOSIS — O99824 Streptococcus B carrier state complicating childbirth: Secondary | ICD-10-CM | POA: Diagnosis present

## 2023-03-20 DIAGNOSIS — Z6841 Body Mass Index (BMI) 40.0 and over, adult: Secondary | ICD-10-CM

## 2023-03-20 DIAGNOSIS — O99213 Obesity complicating pregnancy, third trimester: Secondary | ICD-10-CM | POA: Diagnosis present

## 2023-03-20 LAB — GLUCOSE, CAPILLARY
Glucose-Capillary: 175 mg/dL — ABNORMAL HIGH (ref 70–99)
Glucose-Capillary: 149 mg/dL — ABNORMAL HIGH (ref 70–99)
Glucose-Capillary: 165 mg/dL — ABNORMAL HIGH (ref 70–99)
Glucose-Capillary: 167 mg/dL — ABNORMAL HIGH (ref 70–99)
Glucose-Capillary: 174 mg/dL — ABNORMAL HIGH (ref 70–99)
Glucose-Capillary: 152 mg/dL — ABNORMAL HIGH (ref 70–99)
Glucose-Capillary: 134 mg/dL — ABNORMAL HIGH (ref 70–99)
Glucose-Capillary: 127 mg/dL — ABNORMAL HIGH (ref 70–99)
Glucose-Capillary: 144 mg/dL — ABNORMAL HIGH (ref 70–99)
Glucose-Capillary: 182 mg/dL — ABNORMAL HIGH (ref 70–99)
Glucose-Capillary: 152 mg/dL — ABNORMAL HIGH (ref 70–99)
Glucose-Capillary: 105 mg/dL — ABNORMAL HIGH (ref 70–99)
Glucose-Capillary: 132 mg/dL — ABNORMAL HIGH (ref 70–99)
Glucose-Capillary: 137 mg/dL — ABNORMAL HIGH (ref 70–99)
Glucose-Capillary: 149 mg/dL — ABNORMAL HIGH (ref 70–99)
Glucose-Capillary: 125 mg/dL — ABNORMAL HIGH (ref 70–99)
Glucose-Capillary: 123 mg/dL — ABNORMAL HIGH (ref 70–99)
Glucose-Capillary: 106 mg/dL — ABNORMAL HIGH (ref 70–99)
Glucose-Capillary: 126 mg/dL — ABNORMAL HIGH (ref 70–99)
Glucose-Capillary: 114 mg/dL — ABNORMAL HIGH (ref 70–99)

## 2023-03-20 LAB — COMPREHENSIVE METABOLIC PANEL
ALT: 20 U/L (ref 0–44)
AST: 28 U/L (ref 15–41)
Albumin: 2.9 g/dL — ABNORMAL LOW (ref 3.5–5.0)
Alkaline Phosphatase: 78 U/L (ref 38–126)
Anion gap: 13 (ref 5–15)
BUN: 15 mg/dL (ref 6–20)
CO2: 19 mmol/L — ABNORMAL LOW (ref 22–32)
Calcium: 9.3 mg/dL (ref 8.9–10.3)
Chloride: 103 mmol/L (ref 98–111)
Creatinine, Ser: 0.77 mg/dL (ref 0.44–1.00)
GFR, Estimated: >60 mL/min (ref >60)
Glucose, Bld: 175 mg/dL — ABNORMAL HIGH (ref 70–99)
Potassium: 3.9 mmol/L (ref 3.5–5.1)
Sodium: 135 mmol/L (ref 135–145)
Total Bilirubin: 0.6 mg/dL (ref 0.3–1.2)
Total Protein: 7.2 g/dL (ref 6.5–8.1)

## 2023-03-20 LAB — BASIC METABOLIC PANEL
Anion gap: 8 (ref 5–15)
BUN: 13 mg/dL (ref 6–20)
CO2: 20 mmol/L — ABNORMAL LOW (ref 22–32)
Calcium: 9.1 mg/dL (ref 8.9–10.3)
Chloride: 106 mmol/L (ref 98–111)
Creatinine, Ser: 0.67 mg/dL (ref 0.44–1.00)
GFR, Estimated: >60 mL/min (ref >60)
Glucose, Bld: 134 mg/dL — ABNORMAL HIGH (ref 70–99)
Potassium: 3.6 mmol/L (ref 3.5–5.1)
Sodium: 134 mmol/L — ABNORMAL LOW (ref 135–145)

## 2023-03-20 LAB — CBC
HCT: 37.4 % (ref 36.0–46.0)
Hemoglobin: 12.4 g/dL (ref 12.0–15.0)
MCH: 28 pg (ref 26.0–34.0)
MCHC: 33.2 g/dL (ref 30.0–36.0)
MCV: 84.4 fL (ref 80.0–100.0)
Platelets: 206 10*3/uL (ref 150–400)
RBC: 4.43 MIL/uL (ref 3.87–5.11)
RDW: 13.8 % (ref 11.5–15.5)
WBC: 8.2 10*3/uL (ref 4.0–10.5)
nRBC: 0 % (ref 0.0–0.2)

## 2023-03-20 LAB — RPR: RPR Ser Ql: NONREACTIVE

## 2023-03-20 LAB — TYPE AND SCREEN
ABO/RH(D): O POS
Antibody Screen: NEGATIVE

## 2023-03-20 LAB — PROTEIN / CREATININE RATIO, URINE
Creatinine, Urine: 123 mg/dL
Protein Creatinine Ratio: 0.54 mg/mg{Cre} — ABNORMAL HIGH (ref 0.00–0.15)
Total Protein, Urine: 66 mg/dL

## 2023-03-20 MED ORDER — AMMONIA AROMATIC IN INHA
RESPIRATORY_TRACT | Status: AC
  Filled 2023-03-20: qty 10

## 2023-03-20 MED ORDER — LIDOCAINE HCL (PF) 1 % IJ SOLN
INTRAMUSCULAR | Status: AC
  Filled 2023-03-20: qty 30

## 2023-03-20 MED ORDER — DEXTROSE IN LACTATED RINGERS 5 % IV SOLN: INTRAVENOUS | Status: DC

## 2023-03-20 MED ORDER — LACTATED RINGERS IV SOLN: INTRAVENOUS | Status: DC

## 2023-03-20 MED ORDER — OXYCODONE-ACETAMINOPHEN 5-325 MG PO TABS: 2.0000 | ORAL_TABLET | ORAL | Status: DC | PRN

## 2023-03-20 MED ORDER — OXYTOCIN-SODIUM CHLORIDE 30-0.9 UT/500ML-% IV SOLN
1.0000 m[IU]/min | INTRAVENOUS | Status: DC
  Administered 2023-03-20: 2 m[IU]/min via INTRAVENOUS

## 2023-03-20 MED ORDER — OXYCODONE-ACETAMINOPHEN 5-325 MG PO TABS: 1.0000 | ORAL_TABLET | ORAL | Status: DC | PRN

## 2023-03-20 MED ORDER — LIDOCAINE HCL (PF) 1 % IJ SOLN: 30.0000 mL | INTRAMUSCULAR | Status: DC | PRN

## 2023-03-20 MED ORDER — SODIUM CHLORIDE 0.9 % IV SOLN
1.0000 g | INTRAVENOUS | Status: DC
  Administered 2023-03-20 – 2023-03-21 (×6): 1 g via INTRAVENOUS
  Filled 2023-03-20 (×8): qty 1000

## 2023-03-20 MED ORDER — OXYTOCIN-SODIUM CHLORIDE 30-0.9 UT/500ML-% IV SOLN
1.0000 m[IU]/min | INTRAVENOUS | Status: DC
  Administered 2023-03-21: 2 m[IU]/min via INTRAVENOUS

## 2023-03-20 MED ORDER — INSULIN REGULAR(HUMAN) IN NACL 100-0.9 UT/100ML-% IV SOLN
INTRAVENOUS | Status: DC
  Administered 2023-03-20: 5.5 [IU]/h via INTRAVENOUS
  Administered 2023-03-20: 2 [IU]/h via INTRAVENOUS
  Filled 2023-03-20 (×2): qty 100

## 2023-03-20 MED ORDER — TERBUTALINE SULFATE 1 MG/ML IJ SOLN
0.2500 mg | Freq: Once | INTRAMUSCULAR | Status: DC | PRN
  Filled 2023-03-20: qty 1

## 2023-03-20 MED ORDER — MISOPROSTOL 200 MCG PO TABS
ORAL_TABLET | ORAL | Status: AC
  Filled 2023-03-20: qty 4

## 2023-03-20 MED ORDER — OXYTOCIN BOLUS FROM INFUSION
333.0000 mL | Freq: Once | INTRAVENOUS | Status: AC
  Administered 2023-03-21: 333 mL via INTRAVENOUS

## 2023-03-20 MED ORDER — TERBUTALINE SULFATE 1 MG/ML IJ SOLN: 0.2500 mg | Freq: Once | INTRAMUSCULAR | Status: DC | PRN

## 2023-03-20 MED ORDER — ONDANSETRON HCL 4 MG/2ML IJ SOLN
4.0000 mg | Freq: Four times a day (QID) | INTRAMUSCULAR | Status: DC | PRN
  Administered 2023-03-20: 4 mg via INTRAVENOUS
  Filled 2023-03-20: qty 2

## 2023-03-20 MED ORDER — GLYBURIDE 5 MG PO TABS
10.0000 mg | ORAL_TABLET | Freq: Two times a day (BID) | ORAL | Status: DC
  Administered 2023-03-20: 10 mg via ORAL
  Filled 2023-03-20: qty 2

## 2023-03-20 MED ORDER — MISOPROSTOL 50MCG HALF TABLET
ORAL_TABLET | ORAL | Status: AC
  Filled 2023-03-20: qty 1

## 2023-03-20 MED ORDER — SOD CITRATE-CITRIC ACID 500-334 MG/5ML PO SOLN: 30.0000 mL | ORAL | Status: DC | PRN

## 2023-03-20 MED ORDER — MISOPROSTOL 25 MCG QUARTER TABLET
50.0000 ug | ORAL_TABLET | ORAL | Status: DC
  Administered 2023-03-20 (×2): 50 ug via VAGINAL

## 2023-03-20 MED ORDER — SODIUM CHLORIDE 0.9 % IV SOLN
2.0000 g | Freq: Once | INTRAVENOUS | Status: AC
  Administered 2023-03-20: 2 g via INTRAVENOUS
  Filled 2023-03-20: qty 2000

## 2023-03-20 MED ORDER — OXYTOCIN 10 UNIT/ML IJ SOLN
INTRAMUSCULAR | Status: AC
  Filled 2023-03-20: qty 2

## 2023-03-20 MED ORDER — DEXTROSE 50 % IV SOLN: 0.0000 mL | INTRAVENOUS | Status: DC | PRN

## 2023-03-20 MED ORDER — ACETAMINOPHEN 325 MG PO TABS: 650.0000 mg | ORAL_TABLET | ORAL | Status: DC | PRN

## 2023-03-20 MED ORDER — SODIUM CHLORIDE 0.9 % IV SOLN: 2.0000 g | Freq: Once | INTRAVENOUS | Status: DC

## 2023-03-20 MED ORDER — FENTANYL CITRATE (PF) 100 MCG/2ML IJ SOLN
50.0000 ug | INTRAMUSCULAR | Status: DC | PRN
  Administered 2023-03-20 (×2): 100 ug via INTRAVENOUS
  Filled 2023-03-20 (×3): qty 2

## 2023-03-20 MED ORDER — LACTATED RINGERS IV SOLN: 500.0000 mL | INTRAVENOUS | Status: DC | PRN

## 2023-03-20 MED ORDER — LACTATED RINGERS IV BOLUS
1000.0000 mL | Freq: Once | INTRAVENOUS | Status: AC
  Administered 2023-03-20: 1000 mL via INTRAVENOUS

## 2023-03-20 MED ORDER — NALBUPHINE HCL 10 MG/ML IJ SOLN
10.0000 mg | INTRAMUSCULAR | Status: DC | PRN
  Administered 2023-03-21: 10 mg via INTRAVENOUS
  Filled 2023-03-20 (×2): qty 1

## 2023-03-20 MED ORDER — FENTANYL-BUPIVACAINE-NACL 0.5-0.125-0.9 MG/250ML-% EP SOLN
EPIDURAL | Status: AC
  Filled 2023-03-20: qty 250

## 2023-03-20 MED ORDER — SODIUM CHLORIDE 0.9 % IV SOLN: 1.0000 g | INTRAVENOUS | Status: DC

## 2023-03-20 MED ORDER — OXYTOCIN-SODIUM CHLORIDE 30-0.9 UT/500ML-% IV SOLN
2.5000 [IU]/h | INTRAVENOUS | Status: DC
  Administered 2023-03-21: 2.5 [IU]/h via INTRAVENOUS
  Filled 2023-03-20: qty 500

## 2023-03-20 MED ORDER — MISOPROSTOL 50MCG HALF TABLET
50.0000 ug | ORAL_TABLET | Freq: Once | ORAL | Status: AC
  Administered 2023-03-20: 50 ug via ORAL

## 2023-03-20 MED ORDER — MISOPROSTOL 25 MCG QUARTER TABLET
50.0000 ug | ORAL_TABLET | ORAL | Status: DC | PRN
  Filled 2023-03-20 (×2): qty 1

## 2023-03-20 NOTE — Inpatient Diabetes Management (Signed)
Inpatient Diabetes Program Recommendations  Diabetes Treatment Program Recommendations  ADA Standards of Care Diabetes in Pregnancy Target Glucose Ranges:  Fasting: 70 - 95 mg/dL 1 hr postprandial: Less than 140mg /dL (from first bite of meal) 2 hr postprandial: Less than 120 mg/dL (from first bite of meal)     Latest Reference Range & Units 03/20/23 01:28 03/20/23 02:36 03/20/23 03:38 03/20/23 04:55 03/20/23 05:58 03/20/23 06:55  Glucose-Capillary 70 - 99 mg/dL 175 (H) 149 (H) 165 (H) 167 (H) 174 (H) 152 (H)   Review of Glycemic Control  Diabetes history: GDM; [redacted]W[redacted]D Outpatient Diabetes medications: Glyburide 10 mg BID, NPH 78 units QAM, NPH 28 units QPM, Regular 38 units with breakfast, Regular 28 units with supper Current orders for Inpatient glycemic control: IV insulin, Glyburide 10 mg BID  Inpatient Diabetes Program Recommendations:    Oral DM medications: Please discontinue Glyburide 10 mg BID.  Insulin: Agree with IV insulin during labor. Anticipate glucose to return to baseline following delivery. After delivery, consider ordering CBGs AC&HS and Novolog 0-9 units AC and Novolog 0-5 units QHS.  NOTE: Patient admitted today for IOL, noted to be [redacted]W[redacted]D gestation, with hx of GDM taking insulin and oral DM medication outpatient. Per chart review, patient was inpatient 03/01/23-03/03/23 and was started on insulin during that admission.  Thanks, Barnie Alderman, RN, MSN, [place] Diabetes Coordinator Inpatient Diabetes Program [phone] (Team Pager from 8am to [place])

## 2023-03-20 NOTE — Progress Notes (Signed)
IV Team Danny RN at [facility] to evaluate for IV placement

## 2023-03-20 NOTE — H&P (Signed)
History and Physical   HPI  Dominique Sanders is a 27 y.o. G2P1001 at [redacted]w[redacted]d Estimated Date of Delivery: 04/08/23 who is being admitted for induction of labor for poorly controlled gestational diabetes requiring insulin, preeclampsia without severe features, and BMI>50. She denies HA, visual changes, epigastric pain, vaginal bleeding, LOF, and ctx. She endorses good fetal movement. She has not been monitoring her blood sugars at home.  Prenatal Course Source of care: [facility] with onset of care at 9 weeks OB History  OB History  Gravida Para Term Preterm AB Living  2 1 1  0 0 1  SAB IAB Ectopic Multiple Live Births  0 0 0 0 1    # Outcome Date GA Lbr Len/2nd Weight Sex Delivery Anes PTL Lv  2 Current           1 Term 01/2021 [redacted]w[redacted]d  3459 g  Vag-Spont   LIV    PROBLEM LIST  Pregnancy complications or risks: Patient Active Problem List   Diagnosis Date Noted   Gestational diabetes requiring insulin 03/15/2023   Labor and delivery, indication for care 03/01/2023   Gestational diabetes 03/01/2023   Vaginal bleeding in pregnancy, third trimester 02/26/2023   Elevated blood pressure affecting pregnancy in third trimester, antepartum 02/14/2023   Gestational diabetes mellitus (GDM) in third trimester 02/14/2023   History of pre-eclampsia 09/06/2022   Supervision of high-risk pregnancy 09/06/2022   Mild pre-eclampsia affecting second pregnancy 01/28/2020   History of gestational diabetes 10/15/2019   Obesity affecting pregnancy in third trimester 08/11/2019   BMI 50.0-59.9, adult ([place]) 08/11/2019    Prenatal labs and studies: ABO, Rh: O/Positive/-- (08/21 1444) Antibody: Negative (08/21 1444) Rubella: 5.59 (08/21 1444) RPR: Non Reactive (02/05 1005)  HBsAg: Negative (08/21 1444)  HIV: Non Reactive (02/05 1005)  [id number] (03/19 1630)   Past Medical History:  Diagnosis Date   Diabetes mellitus without complication ([place])    History of gestational diabetes 10/15/2019    Preeclampsia 01/08/2020   Results for orders placed or performed during the hospital encounter of 01/07/20 (from the past 24 hour(s)) Urinalysis, Routine w reflex microscopic     Status: Abnormal  Collection Time: 01/07/20 11:20 PM Result Value Ref Range  Color, Urine STRAW (A) YELLOW  APPearance CLEAR CLEAR  Specific Gravity, Urine 1.009 1.005 - 1.030  pH 7.0 5.0 - 8.0  Glucose, UA >=500 (A) NEGATIVE mg/dL  Hgb urine dips   Trichomonal vaginitis during pregnancy in first trimester 09/12/2022     Past Surgical History:  Procedure Laterality Date   NO PAST SURGERIES     no surgical        Medications    Current Discharge Medication List     CONTINUE these medications which have NOT CHANGED   Details  aspirin EC 81 MG tablet Take 2 tablets (162 mg total) by mouth daily. Swallow whole. Qty: 300 tablet, Refills: 3    glyBURIDE (DIABETA) 5 MG tablet Take 2 tablets (10 mg total) by mouth 2 (two) times daily with a meal. Qty: 180 tablet, Refills: 1    insulin NPH Human (NOVOLIN N) 100 UNIT/ML injection Inject 78 units with breakfast.  Inject 28 units at dinner. Otho Darner: 10 mL, Refills: 11    NIFEdipine (PROCARDIA-XL/NIFEDICAL-XL) 30 MG 24 hr tablet Take 1 tablet (30 mg total) by mouth daily. Can increase to twice a day as needed for symptomatic contractions Qty: 30 tablet, Refills: 0    Accu-Chek Softclix Lancets lancets Check 4 x daily (  fasting and 2 hours after each meal) Qty: 100 each, Refills: 6   Associated Diagnoses: Gestational diabetes mellitus (GDM) in third trimester, gestational diabetes method of control unspecified    Blood Glucose Monitoring Suppl (ACCU-CHEK GUIDE) w/Device KIT 1 kit by Does not apply route as directed. Qty: 1 kit, Refills: 0   Associated Diagnoses: Gestational diabetes mellitus (GDM) in third trimester, gestational diabetes method of control unspecified    glucose blood (ACCU-CHEK GUIDE) test strip Check blood sugars 4 x daily (fasting and 2 hours after  each meal). Qty: 100 each, Refills: 12   Associated Diagnoses: Gestational diabetes mellitus (GDM) in third trimester, gestational diabetes method of control unspecified    insulin regular (NOVOLIN R) 100 units/mL injection INJECT 38 UNITS AT BREAKFAST. INJECT 28 UNITS AT DINNER. Qty: 10 mL, Refills: 11    Prenat-FeCbn-FeAsp-Meth-FA-DHA (PRENATE MINI) 18-0.6-0.4-350 MG CAPS Take 1 capsule daily. Qty: 30 capsule, Refills: 11   Associated Diagnoses: Encounter for supervision of other normal pregnancy in first trimester; [redacted] weeks gestation of pregnancy         Allergies  Patient has no known allergies.  Review of Systems  Constitutional: negative Respiratory: negative Cardiovascular: negative Gastrointestinal: negative Genitourinary:negative Integument/breast: negative Musculoskeletal:negative Behavioral/Psych: negative  Physical Exam  BP 130/75 (BP Location: Left Arm)   Pulse (!) 107   Temp 98 F (36.7 C) (Oral)   Resp 18   Ht 5\' 2"  (1.575 m)   Wt (!) 146.5 kg   LMP 06/03/2022 (Exact Date)   BMI 59.08 kg/m   Constitution: Alert, cooperative, NAD Lungs:  CTAB Cardio: RRR without M/R/G Abd: Soft, gravid, NT Presentation: cephalic per [place] 0000000  CERVIX: 0/thick/ballotable per RN  See Prenatal records for more detailed PE.    FHR:  Baseline: 145 Variability: moderate Accelerations: present Decelerations: not present Toco: irregular, mild Category 1  Test Results  Results for orders placed or performed during the hospital encounter of 03/20/23 (from the past 24 hour(s))  CBC     Status: None   Collection Time: 03/20/23 12:39 AM  Result Value Ref Range   WBC 8.2 4.0 - 10.5 K/uL   RBC 4.43 3.87 - 5.11 MIL/uL   Hemoglobin 12.4 12.0 - 15.0 g/dL   HCT 37.4 36.0 - 46.0 %   MCV 84.4 80.0 - 100.0 fL   MCH 28.0 26.0 - 34.0 pg   MCHC 33.2 30.0 - 36.0 g/dL   RDW 13.8 11.5 - 15.5 %   Platelets 206 150 - 400 K/uL   nRBC 0.0 0.0 - 0.2 %  Glucose, capillary      Status: Abnormal   Collection Time: 03/20/23  1:28 AM  Result Value Ref Range   Glucose-Capillary 175 (H) 70 - 99 mg/dL   Group B Strep positive  Assessment   G2P1001 at [redacted]w[redacted]d Estimated Date of Delivery: 04/08/23  Reassuring maternal/fetal status.  Patient Active Problem List   Diagnosis Date Noted   Gestational diabetes requiring insulin 03/15/2023   Labor and delivery, indication for care 03/01/2023   Gestational diabetes 03/01/2023   Vaginal bleeding in pregnancy, third trimester 02/26/2023   Elevated blood pressure affecting pregnancy in third trimester, antepartum 02/14/2023   Gestational diabetes mellitus (GDM) in third trimester 02/14/2023   History of pre-eclampsia 09/06/2022   Supervision of high-risk pregnancy 09/06/2022   Mild pre-eclampsia affecting second pregnancy 01/28/2020   History of gestational diabetes 10/15/2019   Obesity affecting pregnancy in third trimester 08/11/2019   BMI 50.0-59.9, adult ([place]) 08/11/2019  Plan  1. Admit to L&D for IOL - misoprostol for cervical ripening 2. EFM: Continuous -- Category 1 3. Blood glucose management with EndoTool as indicated   4. Admission labs and preeclampsia labs 5. GBS prophylaxis in active labor 6. Pharmacologic pain relief if desired. 7. Anticipate NSVD 8. Care managed by Dr. Alphonzo Grieve, CNM 03/20/2023 1:44 AM

## 2023-03-20 NOTE — Progress Notes (Addendum)
Dominique Sanders is a 27 y.o. G2P1001 presenting to L&D for scheduled induction. RN discussed induction process and evaluated her understanding of the process. Dominique Sanders verbalized understanding of why she is being induced and agreed to start the process. She reports positive fetal movement and denies vaginal bleeding, leaking of fluid, and regular contractions. We discussed her birth preferences, including IV meds followed by an epidural. RN provided education on what to expect with misoprostol. Difficulty placing IV, so IV team called to bedside for assistance. 2 IVs placed and labs collected. She reports eating 2 cheeseburgers and fries on the way to her induction. Initial CBG 175, repeat 149. Endotool initiated and education provided. Initial vital signs stable. Fetal monitors applied and education given. Initial FHT 150. Category 1 EFM tracing. Dominique Sanders has been oriented to care environment, including call bell and bed control use. Explained the admission packet, including birth certificate worksheet, Dominique Sanders Assessment, and feeding log. Michael at bedside for labor support. Dominique Sanders is resting comfortably after cervical assessment & misoprostol administration. Plan to encourage rest and reassess cervix in 4 hours. GBS prophylaxis to start in active labor per CNM.

## 2023-03-20 NOTE — Progress Notes (Addendum)
Intrapartum Progress Note  S: Patient without major complaints. Received dose of Stadol ~ 20 minutes ago.   O:  Vitals:   03/20/23 0800 03/20/23 1159 03/20/23 1508 03/20/23 1511  BP: 125/70 (!) 144/77 (!) 175/145 (!) 140/88  Pulse: 94 94  88  Resp:  18    Temp:  98 F (36.7 C)    TempSrc:  Oral    Weight:      Height:        Gen App: NAD, comfortable Abdomen: soft, gravid FHT: baseline 130 bpm.  Accels present.  Decels absent. moderate in degree variability.   Tocometer: contractions irregular, q 1-4 minutes Cervix: 4/60/70/-3 Extremities: Nontender, no edema.  Pitocin: None  Labs:   Latest Reference Range & Units 03/20/23 13:05 03/20/23 14:04 03/20/23 15:11 03/20/23 16:39 03/20/23 17:51  Glucose-Capillary 70 - 99 mg/dL 105 (H) 132 (H) 137 (H) 149 (H) 125 (H)  (H): Data is abnormally high   Assessment:  1: SIUP at [redacted]w[redacted]d 2. GDM, uncontrolled on insulin 3. Pre-eclampsia without severe features 4. Morbid obesity (BMI 59) 5. GBS positive  Plan:  1.Continue IOL, now s/p 3 doses of 50 mcg Cytotec. Will transition to Pitocin. Category I fetal tracing.  2. GDM, uncontrolled on insulin during pregnancy. Continue Endotool.  Will allow patient to eat dinner and transition to liquid diet.  3. Pre-eclampsia without severe features, 1 severe range BP with repeat non-severe.  Has not required medication. Continue to monitor. Can treat as needed.  4. Morbid obesity, at higher risk for dysfunctional labor, inadequate pain control from epidural when desired, and need for C-section. Can request epidural when desired.  5. Continue Ampicillin for GBS prophylaxis   Rubie Maid, MD 03/20/2023 6:00 PM

## 2023-03-20 NOTE — Progress Notes (Addendum)
Intrapartum Progress Note  S: Patient noting pain with contractions, recently received initial dose of IV pain medication (Fentanyl).   O:  Vitals:   03/20/23 0248 03/20/23 0502 03/20/23 0800 03/20/23 1159  BP: 115/65 (!) 118/39 125/70 (!) 144/77  Pulse: (!) 106  94 94  Resp: 18   18  Temp: 98.5 F (36.9 C)   98 F (36.7 C)  TempSrc: Oral   Oral  Weight:      Height:        Gen App: NAD, comfortable Abdomen: soft, gravid FHT: baseline 130 bpm.  Accels present.  Decels absent. moderate in degree variability.   Tocometer: contractions irregular, q 1-4 minutes Cervix: 3/50/-3 Extremities: Nontender, no edema.  Pitocin: None  Labs:   Latest Reference Range & Units 03/20/23 08:59 03/20/23 10:02 03/20/23 10:57 03/20/23 11:55 03/20/23 13:05  Glucose-Capillary 70 - 99 mg/dL 127 (H) 144 (H) 182 (H) 152 (H) 105 (H)  (H): Data is abnormally high  Assessment:  1: SIUP at [redacted]w[redacted]d 2. GDM, uncontrolled on insulin 3. Pre-eclampsia without severe features 4. Morbid obesity (BMI 59) 5. GBS positive  Plan:  1.Continue IOL, now s/p 2 doses of 50 mcg Cytotec. Attempted placement of foley balloon with speculum exam prior to assessing cervix but expelled within  10 minutes. Cervical check noted 3 cm dilation. Will continue with 1 more dose of Cytotec. Category I fetal tracing.  2. GDM, uncontrolled on insulin during pregnancy. Continue Endotool. Received Glyburide this morning as well, but will d/c evening dose as patient will likely be reduced to liquid diet by dinner time due to advanced labor. Will allow patient to eat lunch.  3. Pre-eclampsia without severe features, no treatable BPs at this time. Has not required medication. Continue to monitor.  4. Morbid obesity, at higher risk for dysfunctional labor, inadequate pain control from epidural when desired, and need for C-section.  5. Will start GBS prophylaxis with Ampicillin.   Rubie Maid, MD 03/20/2023 1:09 PM

## 2023-03-20 NOTE — Progress Notes (Signed)
IV Team Danny RN at [facility] for 2nd IV placement

## 2023-03-20 NOTE — H&P (Addendum)
Obstetric History and Physical  Avin Chanley is a 27 y.o. G2P1001 with IUP at [redacted]w[redacted]d presenting for scheduled IOL secondary to pre-eclampsia without severe features, GDM on insulin, uncontrolled, and morbid obesity (BMI 59). Patient reports occasional irriatbility but denies contractions, vaginal bleeding, and reports intact membranes, with active fetal movement.    Prenatal Course Source of Care: [facility] with onset of care at 8 weeks Pregnancy complications or risks: Patient Active Problem List   Diagnosis Date Noted   Gestational diabetes requiring insulin 03/15/2023   Labor and delivery, indication for care 03/01/2023   Gestational diabetes 03/01/2023   Vaginal bleeding in pregnancy, third trimester 02/26/2023   Elevated blood pressure affecting pregnancy in third trimester, antepartum 02/14/2023   Gestational diabetes mellitus (GDM) in third trimester 02/14/2023   History of pre-eclampsia 09/06/2022   Supervision of high-risk pregnancy 09/06/2022   Mild pre-eclampsia affecting second pregnancy 01/28/2020   History of gestational diabetes 10/15/2019   Obesity affecting pregnancy in third trimester 08/11/2019   BMI 50.0-59.9, adult ([place]) 08/11/2019   She plans to breastfeed She desires  Nexplanon  for postpartum contraception.   Prenatal labs and studies: ABO, Rh: --/--/O POS (03/27 0039) Antibody: NEG (03/27 0039) Rubella: 5.59 (08/21 1444) RPR: Non Reactive (02/05 1005)  HBsAg: Negative (08/21 1444)  HIV: Non Reactive (02/05 1005)  [id number]-- (03/19 1630) 1 hr Glucola: early glucola normal, repeat at 28 weeks 281 Genetic screening normal Anatomy US normal   Past Medical History:  Diagnosis Date   Diabetes mellitus without complication ([place])    History of gestational diabetes 10/15/2019   Preeclampsia 01/08/2020   Results for orders placed or performed during the hospital encounter of 01/07/20 (from the past 24 hour(s)) Urinalysis, Routine w reflex microscopic      Status: Abnormal  Collection Time: 01/07/20 11:20 PM Result Value Ref Range  Color, Urine STRAW (A) YELLOW  APPearance CLEAR CLEAR  Specific Gravity, Urine 1.009 1.005 - 1.030  pH 7.0 5.0 - 8.0  Glucose, UA >=500 (A) NEGATIVE mg/dL  Hgb urine dips   Trichomonal vaginitis during pregnancy in first trimester 09/12/2022    Past Surgical History:  Procedure Laterality Date   NO PAST SURGERIES     no surgical       OB History  Gravida Para Term Preterm AB Living  2 1 1     1   SAB IAB Ectopic Multiple Live Births          1    # Outcome Date GA Lbr Len/2nd Weight Sex Delivery Anes PTL Lv  2 Current           1 Term 01/2020 [redacted]w[redacted]d  3459 g  Vag-Spont   LIV    Social History   Socioeconomic History   Marital status: Single    Spouse name: Orlene Plum   Number of children: 1   Years of education: 12   Highest education level: 12th grade  Occupational History   Not on file  Tobacco Use   Smoking status: Never   Smokeless tobacco: Never  Vaping Use   Vaping Use: Former  Substance and Sexual Activity   Alcohol use: Not Currently   Drug use: Never   Sexual activity: Not Currently    Birth control/protection: Implant    Comment: planning nexplanon  Other Topics Concern   Not on file  Social History Narrative   Not on file   Social Determinants of Health   Financial Resource Strain: Not on file  Food Insecurity: No Food Insecurity (03/20/2023)   Hunger Vital Sign    Worried About Running Out of Food in the Last Year: Never true    Ran Out of Food in the Last Year: Never true  Transportation Needs: No Transportation Needs (03/20/2023)   PRAPARE - Hydrologist (Medical): No    Lack of Transportation (Non-Medical): No  Physical Activity: Not on file  Stress: Not on file  Social Connections: Not on file    Family History  Problem Relation Age of Onset   Healthy Mother    Healthy Father     Medications Prior to Admission  Medication Sig  Dispense Refill Last Dose   aspirin EC 81 MG tablet Take 2 tablets (162 mg total) by mouth daily. Swallow whole. 300 tablet 3 Past Month   glyBURIDE (DIABETA) 5 MG tablet Take 2 tablets (10 mg total) by mouth 2 (two) times daily with a meal. 180 tablet 1 03/19/2023   insulin NPH Human (NOVOLIN N) 100 UNIT/ML injection Inject 78 units with breakfast.  Inject 28 units at dinner. . 10 mL 11 03/19/2023   insulin regular (NOVOLIN R) 100 units/mL injection INJECT 38 UNITS AT BREAKFAST. INJECT 28 UNITS AT DINNER. 10 mL 11 03/19/2023   NIFEdipine (PROCARDIA-XL/NIFEDICAL-XL) 30 MG 24 hr tablet Take 1 tablet (30 mg total) by mouth daily. Can increase to twice a day as needed for symptomatic contractions (Patient not taking: Reported on 03/20/2023) 30 tablet 0 Not Taking   Accu-Chek Softclix Lancets lancets Check 4 x daily (fasting and 2 hours after each meal) 100 each 6    Blood Glucose Monitoring Suppl (ACCU-CHEK GUIDE) w/Device KIT 1 kit by Does not apply route as directed. 1 kit 0    glucose blood (ACCU-CHEK GUIDE) test strip Check blood sugars 4 x daily (fasting and 2 hours after each meal). 100 each 12    Prenat-FeCbn-FeAsp-Meth-FA-DHA (PRENATE MINI) 18-0.6-0.4-350 MG CAPS Take 1 capsule daily. (Patient not taking: Reported on 03/20/2023) 30 capsule 11 Not Taking    No Known Allergies  Review of Systems: Negative except for what is mentioned in HPI.  Physical Exam: BP (!) 118/39 Comment: filed for previous RN  Pulse (!) 106   Temp 98.5 F (36.9 C) (Oral)   Resp 18   Ht 5\' 2"  (1.575 m)   Wt (!) 146.5 kg   LMP 06/03/2022 (Exact Date)   BMI 59.08 kg/m  CONSTITUTIONAL: Well-developed, well-nourished female in no acute distress.  HENT:  Normocephalic, atraumatic, External right and left ear normal. Oropharynx is clear and moist EYES: Conjunctivae and EOM are normal. Pupils are equal, round, and reactive to light. No scleral icterus.  NECK: Normal range of motion, supple, no masses SKIN: Skin is warm  and dry. No rash noted. Not diaphoretic. No erythema. No pallor. NEUROLOGIC: Alert and oriented to person, place, and time. Normal reflexes, muscle tone coordination. No cranial nerve deficit noted. PSYCHIATRIC: Normal mood and affect. Normal behavior. Normal judgment and thought content. CARDIOVASCULAR: Normal heart rate noted, regular rhythm RESPIRATORY: Effort and breath sounds normal, no problems with respiration noted ABDOMEN: Soft, nontender, nondistended, gravid. MUSCULOSKELETAL: Normal range of motion. No edema and no tenderness. 2+ distal pulses.  Cervical Exam: Dilatation 0.5 cm   Effacement 30%   Station -3   Presentation: cephalic FHT:  Baseline rate 135 bpm   Variability moderate  Accelerations present   Decelerations none Contractions: Difficult to detect with toco due to patient's body habitus, can  trace when lying flat, Every 2-3 mins   Pertinent Labs/Studies:   Results for orders placed or performed during the hospital encounter of 03/20/23 (from the past 24 hour(s))  CBC     Status: None   Collection Time: 03/20/23 12:39 AM  Result Value Ref Range   WBC 8.2 4.0 - 10.5 K/uL   RBC 4.43 3.87 - 5.11 MIL/uL   Hemoglobin 12.4 12.0 - 15.0 g/dL   HCT 37.4 36.0 - 46.0 %   MCV 84.4 80.0 - 100.0 fL   MCH 28.0 26.0 - 34.0 pg   MCHC 33.2 30.0 - 36.0 g/dL   RDW 13.8 11.5 - 15.5 %   Platelets 206 150 - 400 K/uL   nRBC 0.0 0.0 - 0.2 %  Type and screen     Status: None   Collection Time: 03/20/23 12:39 AM  Result Value Ref Range   ABO/RH(D) O POS    Antibody Screen NEG    Sample Expiration      03/23/2023,2359 Performed at [facility], [address] [place], [place] [address]   Protein / creatinine ratio, urine     Status: Abnormal   Collection Time: 03/20/23 12:39 AM  Result Value Ref Range   Creatinine, Urine 123 mg/dL   Total Protein, Urine 66 mg/dL   Protein Creatinine Ratio 0.54 (H) 0.00 - 0.15 mg/mg[Cre]  Comprehensive metabolic panel     Status:  Abnormal   Collection Time: 03/20/23 12:39 AM  Result Value Ref Range   Sodium 135 135 - 145 mmol/L   Potassium 3.9 3.5 - 5.1 mmol/L   Chloride 103 98 - 111 mmol/L   CO2 19 (L) 22 - 32 mmol/L   Glucose, Bld 175 (H) 70 - 99 mg/dL   BUN 15 6 - 20 mg/dL   Creatinine, Ser 0.77 0.44 - 1.00 mg/dL   Calcium 9.3 8.9 - 10.3 mg/dL   Total Protein 7.2 6.5 - 8.1 g/dL   Albumin 2.9 (L) 3.5 - 5.0 g/dL   AST 28 15 - 41 U/L   ALT 20 0 - 44 U/L   Alkaline Phosphatase 78 38 - 126 U/L   Total Bilirubin 0.6 0.3 - 1.2 mg/dL   GFR, Estimated >60 >60 mL/min   Anion gap 13 5 - 15  Glucose, capillary     Status: Abnormal   Collection Time: 03/20/23  1:28 AM  Result Value Ref Range   Glucose-Capillary 175 (H) 70 - 99 mg/dL  Glucose, capillary     Status: Abnormal   Collection Time: 03/20/23  2:36 AM  Result Value Ref Range   Glucose-Capillary 149 (H) 70 - 99 mg/dL  Glucose, capillary     Status: Abnormal   Collection Time: 03/20/23  3:38 AM  Result Value Ref Range   Glucose-Capillary 165 (H) 70 - 99 mg/dL  Glucose, capillary     Status: Abnormal   Collection Time: 03/20/23  4:55 AM  Result Value Ref Range   Glucose-Capillary 167 (H) 70 - 99 mg/dL  Glucose, capillary     Status: Abnormal   Collection Time: 03/20/23  5:58 AM  Result Value Ref Range   Glucose-Capillary 174 (H) 70 - 99 mg/dL  Glucose, capillary     Status: Abnormal   Collection Time: 03/20/23  6:55 AM  Result Value Ref Range   Glucose-Capillary 152 (H) 70 - 99 mg/dL  Glucose, capillary     Status: Abnormal   Collection Time: 03/20/23  7:58 AM  Result Value Ref  Range   Glucose-Capillary 134 (H) 70 - 99 mg/dL     Latest Reference Range & Units 03/20/23 00:39  Protein Creatinine Ratio 0.00 - 0.15 mg/mgCre 0.54 (H)  (H): Data is abnormally high  Assessment : Lorrena Vanscoyk is a 27 y.o. G2P1001 at [redacted]w[redacted]d being admitted for induction of labor due to pre-eclampsia without severe features, uncontrolled GDM on insulin, morbid  obesity.  Plan: - Labor:   Induction as ordered as per protocol with Cytotec. Has received 2nd dose of 50 mcg. Analgesia as needed. Pre-Eclampsia without severe features, BPs have been in normal range. Did not pick up Procardia prescription. Will manage expectantly at this time and treat for any severe range BPs.  GDM - uncontrolled. Patient with continued elevations despite being essentially NPO overnight (notes only having a popsicle around 0200). Continue with Endotool. Diabetes coordinator recommends d/c Glyburide however patient has received morning dose. Based on blood sugars currently, is at low risk for hypoglycemia at this time. Patient allowed to eat carb-modified breakfast this morning. Will likely d/c evening dose.  - FWB: Reassuring fetal heart tracing.  GBS positive. For antibiotic prophylaxis in labor.  - Delivery plan: Hopeful for vaginal delivery.    Rubie Maid, MD [facility] at [facility]

## 2023-03-20 NOTE — Progress Notes (Signed)
Intrapartum Progress Note  S: Patient noting pain with contractions received dose of IV Fentanyl ~ 1 hour ago. Is thinking of getting epidural.   O:  Vitals:   03/20/23 1508 03/20/23 1511 03/20/23 1950 03/20/23 2106  BP: (!) 175/145 (!) 140/88 (!) 144/72 137/73  Pulse:  88 92 85  Resp:   20   Temp:   97.9 F (36.6 C)   TempSrc:   Oral   Weight:      Height:        Gen App: NAD, uncomfortable with contractions Abdomen: soft, gravid FHT: baseline 135 bpm.  Accels present.  Decels absent. moderate in degree variability.   Tocometer: contractions irregular, q 1-4 minutes Cervix: 4/60-70/-3 Extremities: Nontender, no edema.  Pitocin: 4 mIU  Labs:    Latest Reference Range & Units 03/20/23 16:39 03/20/23 17:51 03/20/23 19:42 03/20/23 20:50 03/20/23 21:19 03/20/23 22:22  Glucose-Capillary 70 - 99 mg/dL 149 (H) 125 (H) 123 (H) 126 (H) 106 (H) 114 (H)  (H): Data is abnormally high   Assessment:  1: SIUP at [redacted]w[redacted]d 2. GDM, uncontrolled on insulin 3. Pre-eclampsia without severe features 4. Morbid obesity (BMI 59) 5. GBS positive  Plan:  1.Continue IOL, now on Pitocin. Category I fetal tracing. Desires epidural.  2. GDM, uncontrolled on insulin during pregnancy. Continue Endotool.  Will transition to liquid diet.  3. Pre-eclampsia without severe features, BPs currently mildly elevated. None severe.  Has not required medication. Continue to monitor. Can treat as needed.  4. Continue Ampicillin for GBS prophylaxis   Rubie Maid, MD 03/20/2023 10:22 PM

## 2023-03-21 ENCOUNTER — Encounter: Payer: Self-pay | Admitting: Obstetrics and Gynecology

## 2023-03-21 DIAGNOSIS — O99214 Obesity complicating childbirth: Secondary | ICD-10-CM

## 2023-03-21 DIAGNOSIS — O1404 Mild to moderate pre-eclampsia, complicating childbirth: Secondary | ICD-10-CM | POA: Diagnosis not present

## 2023-03-21 DIAGNOSIS — O99820 Streptococcus B carrier state complicating pregnancy: Secondary | ICD-10-CM

## 2023-03-21 DIAGNOSIS — O24414 Gestational diabetes mellitus in pregnancy, insulin controlled: Secondary | ICD-10-CM

## 2023-03-21 DIAGNOSIS — Z3A37 37 weeks gestation of pregnancy: Secondary | ICD-10-CM

## 2023-03-21 LAB — GLUCOSE, CAPILLARY
Glucose-Capillary: 101 mg/dL — ABNORMAL HIGH (ref 70–99)
Glucose-Capillary: 102 mg/dL — ABNORMAL HIGH (ref 70–99)
Glucose-Capillary: 104 mg/dL — ABNORMAL HIGH (ref 70–99)
Glucose-Capillary: 105 mg/dL — ABNORMAL HIGH (ref 70–99)
Glucose-Capillary: 119 mg/dL — ABNORMAL HIGH (ref 70–99)
Glucose-Capillary: 129 mg/dL — ABNORMAL HIGH (ref 70–99)
Glucose-Capillary: 73 mg/dL (ref 70–99)
Glucose-Capillary: 84 mg/dL (ref 70–99)
Glucose-Capillary: 91 mg/dL (ref 70–99)
Glucose-Capillary: 92 mg/dL (ref 70–99)
Glucose-Capillary: 93 mg/dL (ref 70–99)
Glucose-Capillary: 94 mg/dL (ref 70–99)
Glucose-Capillary: 95 mg/dL (ref 70–99)

## 2023-03-21 LAB — URINE CULTURE: Culture: 10000 — AB

## 2023-03-21 LAB — BASIC METABOLIC PANEL
Anion gap: 8 (ref 5–15)
BUN: 19 mg/dL (ref 6–20)
CO2: 22 mmol/L (ref 22–32)
Calcium: 9 mg/dL (ref 8.9–10.3)
Chloride: 108 mmol/L (ref 98–111)
Creatinine, Ser: 0.95 mg/dL (ref 0.44–1.00)
GFR, Estimated: >60 mL/min (ref >60)
Glucose, Bld: 105 mg/dL — ABNORMAL HIGH (ref 70–99)
Potassium: 4 mmol/L (ref 3.5–5.1)
Sodium: 138 mmol/L (ref 135–145)

## 2023-03-21 MED ORDER — FENTANYL-BUPIVACAINE-NACL 0.5-0.125-0.9 MG/250ML-% EP SOLN
EPIDURAL | Status: DC | PRN
  Administered 2023-03-21: 12 mL/h via EPIDURAL

## 2023-03-21 MED ORDER — BUPIVACAINE HCL (PF) 0.25 % IJ SOLN
INTRAMUSCULAR | Status: DC | PRN
  Administered 2023-03-21: 8 mL via EPIDURAL

## 2023-03-21 MED ORDER — ACETAMINOPHEN 325 MG PO TABS
650.0000 mg | ORAL_TABLET | ORAL | Status: DC | PRN
  Administered 2023-03-21 – 2023-03-22 (×3): 650 mg via ORAL
  Filled 2023-03-21 (×3): qty 2

## 2023-03-21 MED ORDER — FENTANYL-BUPIVACAINE-NACL 0.5-0.125-0.9 MG/250ML-% EP SOLN: 12.0000 mL/h | EPIDURAL | Status: DC | PRN

## 2023-03-21 MED ORDER — FENTANYL-BUPIVACAINE-NACL 0.5-0.125-0.9 MG/250ML-% EP SOLN
EPIDURAL | Status: AC
  Filled 2023-03-21: qty 250

## 2023-03-21 MED ORDER — IBUPROFEN 600 MG PO TABS
600.0000 mg | ORAL_TABLET | Freq: Four times a day (QID) | ORAL | Status: DC
  Administered 2023-03-22 – 2023-03-23 (×7): 600 mg via ORAL
  Filled 2023-03-21 (×7): qty 1

## 2023-03-21 MED ORDER — COCONUT OIL OIL: 1.0000 | TOPICAL_OIL | Status: DC | PRN

## 2023-03-21 MED ORDER — ZOLPIDEM TARTRATE 5 MG PO TABS: 5.0000 mg | ORAL_TABLET | Freq: Every evening | ORAL | Status: DC | PRN

## 2023-03-21 MED ORDER — BENZOCAINE-MENTHOL 20-0.5 % EX AERO
1.0000 | INHALATION_SPRAY | CUTANEOUS | Status: DC | PRN
  Administered 2023-03-21: 1 via TOPICAL
  Filled 2023-03-21: qty 56

## 2023-03-21 MED ORDER — WITCH HAZEL-GLYCERIN EX PADS
1.0000 | MEDICATED_PAD | CUTANEOUS | Status: DC | PRN
  Administered 2023-03-21: 1 via TOPICAL
  Filled 2023-03-21: qty 100

## 2023-03-21 MED ORDER — PHENYLEPHRINE 80 MCG/ML (10ML) SYRINGE FOR IV PUSH (FOR BLOOD PRESSURE SUPPORT): 80.0000 ug | PREFILLED_SYRINGE | INTRAVENOUS | Status: DC | PRN

## 2023-03-21 MED ORDER — SIMETHICONE 80 MG PO CHEW: 80.0000 mg | CHEWABLE_TABLET | ORAL | Status: DC | PRN

## 2023-03-21 MED ORDER — DIBUCAINE (PERIANAL) 1 % EX OINT: 1.0000 | TOPICAL_OINTMENT | CUTANEOUS | Status: DC | PRN

## 2023-03-21 MED ORDER — EPHEDRINE 5 MG/ML INJ
INTRAVENOUS | Status: AC
  Filled 2023-03-21: qty 5

## 2023-03-21 MED ORDER — PRENATAL MULTIVITAMIN CH
1.0000 | ORAL_TABLET | Freq: Every day | ORAL | Status: DC
  Administered 2023-03-22 – 2023-03-23 (×2): 1 via ORAL
  Filled 2023-03-21 (×2): qty 1

## 2023-03-21 MED ORDER — EPHEDRINE 5 MG/ML INJ: 10.0000 mg | INTRAVENOUS | Status: DC | PRN

## 2023-03-21 MED ORDER — ONDANSETRON HCL 4 MG PO TABS: 4.0000 mg | ORAL_TABLET | ORAL | Status: DC | PRN

## 2023-03-21 MED ORDER — EPHEDRINE 5 MG/ML INJ
10.0000 mg | INTRAVENOUS | Status: DC | PRN
  Administered 2023-03-21: 10 mg via INTRAVENOUS

## 2023-03-21 MED ORDER — DIPHENHYDRAMINE HCL 50 MG/ML IJ SOLN: 12.5000 mg | INTRAMUSCULAR | Status: DC | PRN

## 2023-03-21 MED ORDER — DIPHENHYDRAMINE HCL 25 MG PO CAPS: 25.0000 mg | ORAL_CAPSULE | Freq: Four times a day (QID) | ORAL | Status: DC | PRN

## 2023-03-21 MED ORDER — ONDANSETRON HCL 4 MG/2ML IJ SOLN: 4.0000 mg | INTRAMUSCULAR | Status: DC | PRN

## 2023-03-21 MED ORDER — LACTATED RINGERS IV SOLN
500.0000 mL | Freq: Once | INTRAVENOUS | Status: AC
  Administered 2023-03-21: 500 mL via INTRAVENOUS

## 2023-03-21 MED ORDER — INSULIN ASPART 100 UNIT/ML IJ SOLN
0.0000 [IU] | Freq: Three times a day (TID) | INTRAMUSCULAR | Status: DC
  Administered 2023-03-22: 4 [IU] via SUBCUTANEOUS
  Administered 2023-03-22: 3 [IU] via SUBCUTANEOUS
  Filled 2023-03-21 (×3): qty 1

## 2023-03-21 MED ORDER — SENNOSIDES-DOCUSATE SODIUM 8.6-50 MG PO TABS
2.0000 | ORAL_TABLET | Freq: Every day | ORAL | Status: DC
  Administered 2023-03-22 – 2023-03-23 (×2): 2 via ORAL
  Filled 2023-03-21 (×2): qty 2

## 2023-03-21 MED ORDER — LIDOCAINE-EPINEPHRINE (PF) 1.5 %-1:200000 IJ SOLN
INTRAMUSCULAR | Status: DC | PRN
  Administered 2023-03-21: 3 mL via EPIDURAL

## 2023-03-21 NOTE — Progress Notes (Signed)
Birdie Riddle MD at beside for epidural placement at 2132. She attempted to place the epidural and was unsuccessful. CRNA called to bedside at 2141 and made a second attempt to place the epidural. Epidural was unsuccessful and patient refused further attempts. Dr. Marcelline Mates was notified and aware of the situation.  Dr. Marcelline Mates put in new orders for alternate pain control. Patient was given explanation of new medication ordered and agreed with the plan. Patient is now resting comfortably.

## 2023-03-21 NOTE — Anesthesia Preprocedure Evaluation (Signed)
Anesthesia Evaluation  Patient identified by MRN, date of birth, ID band Patient awake    Reviewed: Allergy & Precautions, NPO status , Patient's Chart, lab work & pertinent test results  History of Anesthesia Complications Negative for: history of anesthetic complications  Airway Mallampati: III  TM Distance: >3 FB Neck ROM: full    Dental no notable dental hx.    Pulmonary neg pulmonary ROS   Pulmonary exam normal        Cardiovascular Exercise Tolerance: Good hypertension, On Medications negative cardio ROS Normal cardiovascular exam     Neuro/Psych    GI/Hepatic negative GI ROS,,,  Endo/Other  diabetes  Morbid obesity  Renal/GU   negative genitourinary   Musculoskeletal   Abdominal   Peds  Hematology negative hematology ROS (+)   Anesthesia Other Findings Past Medical History: No date: Diabetes mellitus without complication ([place]) 123XX123: History of gestational diabetes 01/08/2020: Preeclampsia     Comment:  Results for orders placed or performed during the               hospital encounter of 01/07/20 (from the past 24 hour(s))              Urinalysis, Routine w reflex microscopic     Status:               Abnormal  Collection Time: 01/07/20 11:20 PM               Result Value Ref Range  Color, Urine STRAW (A) YELLOW                APPearance CLEAR CLEAR  Specific Gravity,               Urine 1.009 1.005 - 1.030  pH 7.0 5.0 - 8.0  Glucose,               UA >=500 (A) NEGATIVE mg/dL  Hgb urine dips 09/12/2022: Trichomonal vaginitis during pregnancy in first trimester  Past Surgical History: No date: NO PAST SURGERIES No date: no surgical   BMI    Body Mass Index: 59.08 kg/m      Reproductive/Obstetrics (+) Pregnancy                             Anesthesia Physical Anesthesia Plan  ASA: 3  Anesthesia Plan: Epidural   Post-op Pain Management:    Induction:    PONV Risk Score and Plan:   Airway Management Planned: Natural Airway  Additional Equipment:   Intra-op Plan:   Post-operative Plan:   Informed Consent: I have reviewed the patients History and Physical, chart, labs and discussed the procedure including the risks, benefits and alternatives for the proposed anesthesia with the patient or authorized representative who has indicated his/her understanding and acceptance.     Dental Advisory Given  Plan Discussed with: Anesthesiologist  Anesthesia Plan Comments: (Patient reports no bleeding problems and no anticoagulant use.   Patient consented for risks of anesthesia including but not limited to:  - adverse reactions to medications - risk of bleeding, infection and or nerve damage from epidural that could lead to paralysis - risk of headache or failed epidural - nerve damage due to positioning - that if epidural is used for C-section that there is a chance of epidural failure requiring spinal placement or conversion to GA - Damage to heart, brain, lungs, other parts of body or loss of life  Patient  voiced understanding.)       Anesthesia Quick Evaluation

## 2023-03-21 NOTE — Anesthesia Procedure Notes (Signed)
Epidural Patient location during procedure: OB Start time: 03/21/2023 8:42 AM End time: 03/21/2023 8:50 AM  Staffing Anesthesiologist: Darrin Nipper, MD Performed: anesthesiologist   Preanesthetic Checklist Completed: patient identified, IV checked, risks and benefits discussed, surgical consent, monitors and equipment checked, pre-op evaluation and timeout performed  Epidural Patient position: sitting Prep: Betadine Patient monitoring: heart rate, continuous pulse ox and blood pressure Approach: midline Location: L3-L4 Injection technique: LOR air  Needle:  Needle type: Tuohy  Needle gauge: 17 G Needle length: 9 cm Needle insertion depth: 9 cm Catheter at skin depth: 14 cm Test dose: negative and 1.5% lidocaine with Epi 1:200 K  Assessment Sensory level: T4  Additional Notes Good patient positioning and cooperation. Straightforward placement without apparent complications. Reason for block:procedure for pain

## 2023-03-21 NOTE — Progress Notes (Addendum)
Intrapartum Progress Note  S: Patient feeling some pain with contractions but notes pain medications do help when received. Changed from IV fentanyl to Nubain overnight after failed epidural attempt. Was feeling the urge to push around 3 AM.   O:  Vitals:   03/21/23 0017 03/21/23 0408 03/21/23 0713 03/21/23 0719  BP: 133/70 116/71 (!) 143/85   Pulse: 81 83 93   Resp:  17  16  Temp:  98 F (36.7 C)  97.7 F (36.5 C)  TempSrc:  Oral  Oral  Weight:      Height:        Gen App: NAD, uncomfortable with contractions Abdomen: soft, gravid FHT: baseline 130 bpm.  Accels present.  Decels present - from [place], subtle recurrent late decelerations. Also period of minimal variability from 0440 - 0600 . Currently returned to moderate variability without decelerations, however no 15 x 15 accels noted over the past hour.   Tocometer: contractions irregular, q 1-4 minutes Cervix: Dilation: 5 Effacement (%): 70 Cervical Position: Anterior Station: -3 Presentation: Vertex Exam by:: Guadelupe Sabin RN  Extremities: Nontender, no edema.  Pitocin: 14 mIU  Labs:    Latest Reference Range & Units 03/20/23 23:41 03/21/23 01:49 03/21/23 03:50 03/21/23 04:35 03/21/23 06:00 03/21/23 07:15  Glucose-Capillary 70 - 99 mg/dL 93 91 73 119 (H) 95 105 (H)  (H): Data is abnormally high  Assessment:  1: SIUP at [redacted]w[redacted]d 2. GDM, uncontrolled on insulin 3. Pre-eclampsia without severe features 4. Morbid obesity (BMI 59) 5. GBS positive  Plan:  1.Continue IOL, now on Pitocin.  Was Category II tracing, returning to Category I. Pitocin was decreased to 12 after decelerations, now can resume titration.  Desires epidural. Had failed attempt overnight, but willing to try again with new provider.  Otherwise will have to continue management with Nubain if unsuccessful again.  Last cervical exam around 3 AM. Will check again once epidural placed.  2. GDM, better controlled as she is now inpatient. Continue Endotool.   Continue labor diet.  3. Pre-eclampsia without severe features, BPs with occasional mild elevations.  Has not required medication. Continue to monitor. Can treat as needed.  4. Continue Ampicillin for GBS prophylaxis   Rubie Maid, MD 03/21/2023 8:07 AM

## 2023-03-21 NOTE — Anesthesia Procedure Notes (Addendum)
Epidural Patient location during procedure: OB Start time: 03/20/2023 10:41 PM End time: 03/20/2023 11:20 PM  Staffing Anesthesiologist: Ilene Qua, MD Resident/CRNA: Jerrye Noble, CRNA Performed: anesthesiologist and resident/CRNA   Preanesthetic Checklist Completed: patient identified, IV checked, site marked, risks and benefits discussed, surgical consent, monitors and equipment checked, pre-op evaluation and timeout performed  Epidural Patient position: sitting Prep: ChloraPrep Patient monitoring: heart rate, continuous pulse ox and blood pressure Approach: midline Location: L3-L4  Needle:  Needle type: Tuohy  Needle gauge: 17 G Needle length: 9 cm and 9  Additional Notes 2 attempt Pt. Evaluated and documentation done after procedure finished. Patient identified. Risks/Benefits/Options discussed with patient including but not limited to bleeding, infection, nerve damage, paralysis, failed block, incomplete pain control, headache, blood pressure changes, nausea, vomiting, reactions to medication both or allergic, itching and postpartum back pain. Confirmed with bedside nurse the patient's most recent platelet count. Confirmed with patient that they are not currently taking any anticoagulation, have any bleeding history or any family history of bleeding disorders. Patient expressed understanding and wished to proceed. All questions were answered. Sterile technique was used throughout the entire procedure. Please see nursing notes for vital signs.   Procedure aborted. Unable to place epidural catheter x 2 providers. Patient desires to continue labor with IV pain meds  Reason for block:procedure for pain

## 2023-03-21 NOTE — Progress Notes (Signed)
Intrapartum Progress Note  S: Called by nurse to assess patient now that epidural has been placed. Patient with repetitive late decelerations. Has concerns that BP also may be low. Patient feeling much more comfortable after epidural placement.   O:  Vitals:   03/21/23 1000 03/21/23 1005 03/21/23 1010 03/21/23 1020  BP:   120/61 128/85  Pulse:   75 80  Resp:      Temp:      TempSrc:      SpO2: 99% 99% 98%   Weight:      Height:        Gen App: NAD, uncomfortable with contractions Abdomen: soft, gravid FHT: baseline 160 bpm.  Accels present.  Decels present - repetitive late decelerations from baseline to 140s . Moderate variability.  Tocometer: contractions difficult to detect, patient lying on her side.   Cervix: Dilation: 5 Effacement (%): 70 Cervical Position: Anterior Station: -3 Presentation: Vertex Exam by:: Guadelupe Sabin RN  Extremities: Nontender, no edema.  Pitocin: 12 mIU  Labs:  See gluocse log.   Assessment:  1: SIUP at [redacted]w[redacted]d 2. GDM, uncontrolled on insulin 3. Pre-eclampsia without severe features 4. Morbid obesity (BMI 59) 5. GBS positive  Plan:  1.Continue IOL, now on Pitocin.  Pitocin was recently decreased to 12 (from 16 due to decelerations).  Has received dose of ephedrine for BPs. AROM'd with moderate clear fluid, IUPC and FSE placed.  Monitored for ~ 15 min after procedure with improvement in fetal tracing, contractions now visible, Q 2 minutes.  2. GDM, Continue Endotool.  Continue labor diet.  3. Pre-eclampsia without severe features, BPs now normal to low-normal after epidrual. Has not required medication. Continue to monitor. Can treat as needed.  4. Continue Ampicillin for GBS prophylaxis 5. Patient aware that labor has not been progressing as planned, and if fetal tracing becomes non-reassuring for a lengthy period of time or labor fails to progress any further after AROM despite adequate contractions, she may require C-section.  Rubie Maid,  MD 03/21/2023 11:13 AM

## 2023-03-22 LAB — GLUCOSE, CAPILLARY
Glucose-Capillary: 133 mg/dL — ABNORMAL HIGH (ref 70–99)
Glucose-Capillary: 162 mg/dL — ABNORMAL HIGH (ref 70–99)
Glucose-Capillary: 107 mg/dL — ABNORMAL HIGH (ref 70–99)
Glucose-Capillary: 114 mg/dL — ABNORMAL HIGH (ref 70–99)

## 2023-03-22 LAB — CBC
HCT: 33.3 % — ABNORMAL LOW (ref 36.0–46.0)
Hemoglobin: 11 g/dL — ABNORMAL LOW (ref 12.0–15.0)
MCH: 28.6 pg (ref 26.0–34.0)
MCHC: 33 g/dL (ref 30.0–36.0)
MCV: 86.5 fL (ref 80.0–100.0)
Platelets: 163 10*3/uL (ref 150–400)
RBC: 3.85 MIL/uL — ABNORMAL LOW (ref 3.87–5.11)
RDW: 13.9 % (ref 11.5–15.5)
WBC: 9.9 10*3/uL (ref 4.0–10.5)
nRBC: 0 % (ref 0.0–0.2)

## 2023-03-22 NOTE — Anesthesia Postprocedure Evaluation (Signed)
Anesthesia Post Note  Patient: Dominique Sanders  Procedure(s) Performed: AN AD HOC LABOR EPIDURAL  Patient location during evaluation: Mother Baby Anesthesia Type: Epidural Level of consciousness: awake and alert Pain management: pain level controlled Vital Signs Assessment: post-procedure vital signs reviewed and stable Respiratory status: spontaneous breathing, nonlabored ventilation and respiratory function stable Cardiovascular status: stable Postop Assessment: no headache, no backache and epidural receding Anesthetic complications: no  No notable events documented.   Last Vitals:  Vitals:   03/22/23 0020 03/22/23 0500  BP: 122/77 121/74  Pulse: 99 73  Resp: 16 18  Temp: 36.5 C 37.5 C  SpO2: 100% 98%    Last Pain:  Vitals:   03/22/23 0612  TempSrc:   PainSc: 3                  [name]

## 2023-03-22 NOTE — Progress Notes (Signed)
Post Partum Day # 1, s/p SVD.  GDM uncontrolled on insulin, pre-eclampsia without severe features, morbid obesity.  Subjective: no complaints, up ad lib, voiding, and tolerating PO  Objective: Temp:  [97.7 F (36.5 C)-99.5 F (37.5 C)] 98.4 F (36.9 C) (03/29 0822) Pulse Rate:  [64-145] 78 (03/29 0822) Resp:  [16-18] 17 (03/29 0822) BP: (104-228)/(47-152) 127/72 (03/29 0822) SpO2:  [94 %-100 %] 100 % (03/29 [id number])  Physical Exam:  General: alert and no distress  Lungs: clear to auscultation bilaterally Breasts: normal appearance, no masses or tenderness Heart: regular rate and rhythm, S1, S2 normal, no murmur, click, rub or gallop Abdomen: soft, non-tender; bowel sounds normal; no masses,  no organomegaly Pelvis: Lochia: appropriate, Uterine Fundus: firm Extremities: DVT Evaluation: No evidence of DVT seen on physical exam. Negative Homan's sign. No cords or calf tenderness. No significant calf/ankle edema.   Recent Labs    03/20/23 0039 03/22/23 0613  HGB 12.4 11.0*  HCT 37.4 33.3*    Assessment/Plan: Doing well postpartum Breastfeeding, Lactation consult,  Contraception Nexplanon Continue sliding scale insulin, Accuchecks TID AC and HS. Reviewed glucose log. May likely need to start Metformin postpartum.  Mild pre-eclampsia, currently no meds. BPs labile. Will continue to monitor.  Plan for discharge tomorrow    LOS: 2 days   Rubie Maid, MD [facility] at [facility] 03/22/2023 11:56 AM

## 2023-03-23 ENCOUNTER — Ambulatory Visit: Payer: Self-pay

## 2023-03-23 ENCOUNTER — Other Ambulatory Visit: Payer: Self-pay | Admitting: Obstetrics and Gynecology

## 2023-03-23 LAB — GLUCOSE, CAPILLARY
Glucose-Capillary: 106 mg/dL — ABNORMAL HIGH (ref 70–99)
Glucose-Capillary: 113 mg/dL — ABNORMAL HIGH (ref 70–99)

## 2023-03-23 MED ORDER — ACCU-CHEK SOFTCLIX LANCETS MISC: 6 refills | Status: DC

## 2023-03-23 MED ORDER — ACCU-CHEK GUIDE VI STRP: ORAL_STRIP | 12 refills | Status: DC

## 2023-03-23 MED ORDER — METFORMIN HCL 500 MG PO TABS
1000.0000 mg | ORAL_TABLET | Freq: Once | ORAL | Status: AC
  Administered 2023-03-23: 1000 mg via ORAL
  Filled 2023-03-23: qty 2

## 2023-03-23 MED ORDER — IBUPROFEN 600 MG PO TABS: 600.0000 mg | ORAL_TABLET | Freq: Four times a day (QID) | ORAL | 0 refills | Status: DC | PRN

## 2023-03-23 MED ORDER — METFORMIN HCL 1000 MG PO TABS: 1000.0000 mg | ORAL_TABLET | Freq: Two times a day (BID) | ORAL | 0 refills | Status: DC

## 2023-03-23 MED ORDER — ACCU-CHEK GUIDE W/DEVICE KIT: 1.0000 | PACK | 0 refills | Status: DC

## 2023-03-23 NOTE — Lactation Note (Signed)
This note was copied from a baby's chart. Lactation Consultation Note  Patient Name: Dominique Sanders [id number] Date: 03/23/2023 Age:27 hours Reason for consult: Follow-up assessment;Early term 37-38.6wks   Maternal Data Has patient been taught Hand Expression?: Yes Does the patient have breastfeeding experience prior to this delivery?: No Mom and baby for discharge today.  Feeding Mother's Current Feeding Choice: Breast Milk and Formula Mom states she may want to breastfeed but wants to wait to try until she goes home, I offered to assist her, she states she tried yesterday but baby would not latch, she states she wants to pump her breasts with her Spectra pump but wants to start after she goes home, she states she has a friend who pumps and who will help her after discharge.  I encouraged her to pump her breasts q 3hr to assist with making milk.    LATCH Score                    Lactation Tools Discussed/Used    Interventions Interventions: Education [facility] name and no written on white board, given info on out patient lactation option if needed.   Discharge Pump: Personal [place] Program: Yes  Consult Status Consult Status: PRN    Ferol Luz 03/23/2023, 4:47 PM

## 2023-03-23 NOTE — Discharge Summary (Signed)
Postpartum Discharge Summary      Patient Name: Dominique Sanders DOB: 07-14-1996 MRN: [id number]  Date of admission: 03/20/2023 Delivery date:03/21/2023  Delivering provider: Rubie Maid  Date of discharge: 03/23/2023  Admitting diagnosis: Gestational diabetes [O24.419] Intrauterine pregnancy: [redacted]w[redacted]d     Secondary diagnosis:  Principal Problem:   Gestational diabetes Active Problems:   Obesity affecting pregnancy in third trimester   Mild pre-eclampsia affecting second pregnancy  Additional problems: GBS positive status    Discharge diagnosis: Term Pregnancy Delivered, Preeclampsia (mild), and GDM A2                                              Post partum procedures: None Augmentation: AROM, Pitocin, Cytotec, and IP Foley Complications: None  Hospital course: Induction of Labor With Vaginal Delivery   27 y.o. yo G2P2002 at [redacted]w[redacted]d was admitted to the hospital 03/20/2023 for induction of labor.  Indication for induction: Preeclampsia and A2 DM.  Patient had an labor course that was overall uncomplicated.  She did receive two epidural attempts due to initial first failed attempt.  Membrane Rupture Time/Date: 10:50 AM ,03/21/2023   Delivery Method:Vaginal, Spontaneous  Episiotomy: None  Lacerations:  1st degree;Vaginal  Details of delivery can be found in separate delivery note.  Patient had a postpartum course that was uncomplicated. Patient is discharged home 03/23/23.  Newborn Data: Birth date:03/21/2023  Birth time:5:47 PM  Gender:Female  Living status:Living  Apgars:8 ,9  Weight:3440 g   Magnesium Sulfate received: No BMZ received: No Rhophylac:No MMR:No T-DaP:Given prenatally Flu: No Transfusion:No  Physical exam  Vitals:   03/22/23 2118 03/22/23 2315 03/23/23 0317 03/23/23 0801  BP: 130/74 131/72 109/74 115/71  Pulse:  80 86 73  Resp:  20 20 19   Temp:  97.8 F (36.6 C) 97.8 F (36.6 C) 98.5 F (36.9 C)  TempSrc:  Oral Oral Oral  SpO2:  100% 99% 100%   Weight:      Height:       General: alert, cooperative, and no distress Lochia: appropriate Uterine Fundus: firm Incision: N/A DVT Evaluation: No evidence of DVT seen on physical exam. Negative Homan's sign. No cords or calf tenderness. No significant calf/ankle edema. Labs: Lab Results  Component Value Date   WBC 9.9 03/22/2023   HGB 11.0 (L) 03/22/2023   HCT 33.3 (L) 03/22/2023   MCV 86.5 03/22/2023   PLT 163 03/22/2023      Latest Ref Rng & Units 03/21/2023    5:41 AM  CMP  Glucose 70 - 99 mg/dL 105   BUN 6 - 20 mg/dL 19   Creatinine 0.44 - 1.00 mg/dL 0.95   Sodium 135 - 145 mmol/L 138   Potassium 3.5 - 5.1 mmol/L 4.0   Chloride 98 - 111 mmol/L 108   CO2 22 - 32 mmol/L 22   Calcium 8.9 - 10.3 mg/dL 9.0    Edinburgh Score:    03/21/2023    8:41 PM  Edinburgh Postnatal Depression Scale Screening Tool  I have been able to laugh and see the funny side of things. 0  I have looked forward with enjoyment to things. 0  I have blamed myself unnecessarily when things went wrong. 0  I have been anxious or worried for no good reason. 2  I have felt scared or panicky for no good reason. 1  Things have  been getting on top of me. 3  I have been so unhappy that I have had difficulty sleeping. 0  I have felt sad or miserable. 1  I have been so unhappy that I have been crying. 1  The thought of harming myself has occurred to me. 0  Edinburgh Postnatal Depression Scale Total 8      After visit meds:  Allergies as of 03/23/2023   No Known Allergies      Medication List     STOP taking these medications    aspirin EC 81 MG tablet   glyBURIDE 5 MG tablet Commonly known as: DIABETA   insulin NPH Human 100 UNIT/ML injection Commonly known as: NOVOLIN N   insulin regular 100 units/mL injection Commonly known as: NovoLIN R   NIFEdipine 30 MG 24 hr tablet Commonly known as: PROCARDIA-XL/NIFEDICAL-XL       TAKE these medications    Accu-Chek Guide test  strip Generic drug: glucose blood Check blood sugars 4 x daily (fasting and before each meal). What changed: additional instructions   Accu-Chek Guide w/Device Kit 1 kit by Does not apply route as directed.   Accu-Chek Softclix Lancets lancets Check 4 x daily (fasting and before each meal) What changed: additional instructions   ibuprofen 600 MG tablet Commonly known as: ADVIL Take 1 tablet (600 mg total) by mouth every 6 (six) hours as needed.   metFORMIN 1000 MG tablet Commonly known as: GLUCOPHAGE Take 1 tablet (1,000 mg total) by mouth 2 (two) times daily with a meal.   Prenate Mini 18-0.6-0.4-350 MG Caps Take 1 capsule daily.         Discharge home in stable condition Infant Feeding: Bottle and Breast Infant Disposition:home with mother Discharge instruction: per After Visit Summary and Postpartum booklet. Activity: Advance as tolerated. Pelvic rest for 6 weeks.  Diet: routine diet Anticipated Birth Control: Nexplanon Postpartum Appointment:6 weeks Additional Postpartum F/U: Postpartum Depression checkup and BP check 1-2 weeks Future Appointments: Future Appointments  Date Time Provider [place]  04/05/2023  9:35 AM Rubie Maid, MD [facility] None   Follow up Visit:  [facility] at [facility] Follow up.   Specialty: Obstetrics and Gynecology Why: F/u with Dr. Marcelline Mates in 2 weeks for postpartum mood check and BP check Contact information: [address] [place] [id number] [phone]                    03/23/2023 Rubie Maid, MD

## 2023-03-23 NOTE — Progress Notes (Signed)
Post Partum Day # 2, s/p SVD.  GDM uncontrolled on insulin, pre-eclampsia without severe features, morbid obesity.  Subjective: no complaints, up ad lib, voiding, and tolerating PO. Has questions regarding formula use after discharge.  Also is having issues with getting her breast pump to work. Has only tried latching once or twice.   Objective: Temp:  [97.5 F (36.4 C)-98.5 F (36.9 C)] 98.5 F (36.9 C) (03/30 0801) Pulse Rate:  [73-94] 73 (03/30 0801) Resp:  [18-20] 19 (03/30 0801) BP: (109-141)/(71-88) 115/71 (03/30 0801) SpO2:  [96 %-100 %] 100 % (03/30 0801)  Physical Exam:  General: alert and no distress  Lungs: clear to auscultation bilaterally Breasts: normal appearance, no masses or tenderness Heart: regular rate and rhythm, S1, S2 normal, no murmur, click, rub or gallop Abdomen: soft, non-tender; bowel sounds normal; no masses,  no organomegaly Pelvis: Lochia: appropriate, Uterine Fundus: firm Extremities: DVT Evaluation: No evidence of DVT seen on physical exam. Negative Homan's sign. No cords or calf tenderness. No significant calf/ankle edema.   Recent Labs    03/22/23 0613  HGB 11.0*  HCT 33.3*    Latest Reference Range & Units 03/22/23 08:17 03/22/23 12:05 03/22/23 16:59 03/22/23 22:26 03/23/23 10:26  Glucose-Capillary 70 - 99 mg/dL 133 (H) 162 (H) 107 (H) 114 (H) 106 (H)  (H): Data is abnormally high  Assessment/Plan: Doing well postpartum Breastfeeding and formula feeding, needs Lactation consult,  Contraception Nexplanon Continue sliding scale insulin, Accuchecks TID AC and HS. Patient with h/o pre-diabetes prior to pregnancy. Will start Metformin postpartum.  Mild pre-eclampsia, currently no meds. BPs labile. Will continue to monitor outpatient. No meds at this time.  Plan for discharge today.    LOS: 3 days   Rubie Maid, MD [facility] at [facility] 03/23/2023 10:36 AM

## 2023-03-23 NOTE — Discharge Instructions (Addendum)

## 2023-03-23 NOTE — Progress Notes (Signed)
Patient discharged home with family.  Discharge instructions, when to follow up, and prescriptions reviewed with patient.  Patient verbalized understanding. Patient will be escorted out by auxiliary.   

## 2023-04-03 NOTE — Progress Notes (Deleted)
   OBSTETRICS POSTPARTUM CLINIC PROGRESS NOTE  Subjective:     Dominique Sanders is a 27 y.o. G75P2002 female who presents for a postpartum visit. She is 3 week postpartum following a {delivery:12449}. I have fully reviewed the prenatal and intrapartum course. The delivery was at 37 & 3 gestational weeks.  Anesthesia: epidural. Postpartum course has been ***. Baby's course has been ***. Baby is feeding by {breast/bottle:69}. Bleeding: patient {HAS HAS GUR:42706} not resumed menses, with Patient's last menstrual period was 06/03/2022 (exact date).. Bowel function is {normal:32111}. Bladder function is {normal:32111}. Patient {is/is not:9024} sexually active. Contraception method desired is {contraceptive method:5051}. Postpartum depression screening: {neg default:13464::"negative"}.  EDPS score is ***.    The following portions of the patient's history were reviewed and updated as appropriate: allergies, current medications, past family history, past medical history, past social history, past surgical history, and problem list.  Review of Systems {ros; complete:30496}   Objective:    LMP 06/03/2022 (Exact Date)   General:  alert and no distress   Breasts:  inspection negative, no nipple discharge or bleeding, no masses or nodularity palpable  Lungs: clear to auscultation bilaterally  Heart:  regular rate and rhythm, S1, S2 normal, no murmur, click, rub or gallop  Abdomen: soft, non-tender; bowel sounds normal; no masses,  no organomegaly.  ***Well healed Pfannenstiel incision   Vulva:  normal  Vagina: normal vagina, no discharge, exudate, lesion, or erythema  Cervix:  no cervical motion tenderness and no lesions  Corpus: normal size, contour, position, consistency, mobility, non-tender  Adnexa:  normal adnexa and no mass, fullness, tenderness  Rectal Exam: Not performed.         Labs:  Lab Results  Component Value Date   HGB 11.0 (L) 03/22/2023     Assessment:   No diagnosis found.    Plan:    1. Contraception: {method:5051} 2. Will check Hgb for h/o postpartum anemia of less than 10.  3. Follow up in: {1-10:13787} {time; units:19136} or as needed.    Hildred Laser, MD. [facility]

## 2023-04-05 ENCOUNTER — Telehealth (INDEPENDENT_AMBULATORY_CARE_PROVIDER_SITE_OTHER): Payer: Medicaid Other | Admitting: Obstetrics and Gynecology

## 2023-04-05 ENCOUNTER — Encounter: Payer: Self-pay | Admitting: Obstetrics and Gynecology

## 2023-04-05 ENCOUNTER — Ambulatory Visit: Payer: Medicaid Other | Admitting: Obstetrics and Gynecology

## 2023-04-05 DIAGNOSIS — Z8632 Personal history of gestational diabetes: Secondary | ICD-10-CM

## 2023-04-05 DIAGNOSIS — Z8759 Personal history of other complications of pregnancy, childbirth and the puerperium: Secondary | ICD-10-CM

## 2023-04-05 DIAGNOSIS — Z1332 Encounter for screening for maternal depression: Secondary | ICD-10-CM

## 2023-04-05 DIAGNOSIS — F53 Postpartum depression: Secondary | ICD-10-CM

## 2023-04-05 MED ORDER — SERTRALINE HCL 25 MG PO TABS: 25.0000 mg | ORAL_TABLET | Freq: Every day | ORAL | 0 refills | Status: DC

## 2023-04-05 NOTE — Progress Notes (Signed)
Virtual Visit via Video Note  I connected with Dominique Sanders on 04/05/23 at  11:15 AM EDT by a video enabled telemedicine application and verified that I am speaking with the correct person using two identifiers.  Location: Patient: Home Provider: Office   I discussed the limitations of evaluation and management by telemedicine and the availability of in person appointments. The patient expressed understanding and agreed to proceed.    History of Present Illness:   Dominique Sanders is a 27 y.o. G75P2002 female who presents for a 2 week televisit for mood check. She is 2 weeks postpartum following a normal spontaneous vaginal delivery.  The  was at 37.3 gestational weeks. Pregnancy was morbid obesity, complicated by uncontrolled GDM (on insulin) and mild pre-eclampsia.  Postpartum course has been well so far. Baby is feeding by bottle. Bleeding: light. Postpartum depression screening: positive.  EDPS score is 17. Noting lots of irritability, also noting relationships with FOB and patient's grandmother are a little tense at the time (adoptive grandmother dealing with stage IV cancer). Also notes her toddler is having some difficulty adjusting to new baby.      The following portions of the patient's history were reviewed and updated as appropriate: allergies, current medications, past family history, past medical history, past social history, past surgical history, and problem list.    Observations/Objective:   Blood pressure 120/80, height 5\' 2"  (1.575 m), weight (!) 321 lb (145.6 kg), not currently breastfeeding.   Gen App: NAD Psych: normal speech, affect. Good mood.        04/05/2023   10:55 AM 03/21/2023    8:41 PM 01/29/2020    8:00 PM  Edinburgh Postnatal Depression Scale Screening Tool  I have been able to laugh and see the funny side of things. 1 0 0  I have looked forward with enjoyment to things. 1 0 0  I have blamed myself unnecessarily when things went wrong. 2 0 1  I have been  anxious or worried for no good reason. 3 2 1   I have felt scared or panicky for no good reason. 1 1 0  Things have been getting on top of me. 3 3 0  I have been so unhappy that I have had difficulty sleeping. 2 0 0  I have felt sad or miserable. 1 1 0  I have been so unhappy that I have been crying. 2 1 0  The thought of harming myself has occurred to me. 1 0 0  Edinburgh Postnatal Depression Scale Total 17 8 2         Assessment and Plan:   1. Encounter for screening for maternal depression - Screening Positive today. Discussion had with patient today regarding symptoms.  Likely suffering from postpartum blues vs depression. Also with several life stressors ongoing. Currently denies SI/HI.  Advised on getting as much rest as possible and utilizing her support system as sleep deprivation can also intensify symptoms. Discussed options for management, including counseling and/or medications, or both.  Patient desires medication for now.  Will prescribe Zoloft 25 mg and can titrate up to 50 mg after 1 week.  To follow up in 3-4 weeks with postpartum visit.     2. Postpartum state - Continue routine postpartum home care.    3.  Preeclampsia without severe features -Patient unable to come in office for BP check today, however notes that she did find her blood pressure cuff at home.  Was able to check BP today, noted BP  of 120/80.  Denies any other symptoms at this time.  4.  Gestational diabetes -Patient was discharged home on metformin due to continued uncontrolled of her gestational diabetes.  Advised that she will likely need to remain on this medication for some time.  Patient also with a history of prediabetes prior to this pregnancy, and had gestational diabetes in her first pregnancy.   Follow Up Instructions:     I discussed the assessment and treatment plan with the patient. The patient was provided an opportunity to ask questions and all were answered. The patient agreed with the  plan and demonstrated an understanding of the instructions.   The patient was advised to call back or seek an in-person evaluation if the symptoms worsen or if the condition fails to improve as anticipated.   I provided 17 minutes of non-face-to-face time during this encounter.     Hildred Laser, MD [facility]

## 2023-04-05 NOTE — Patient Instructions (Signed)

## 2023-04-08 ENCOUNTER — Inpatient Hospital Stay: Admit: 2023-04-08 | Payer: Self-pay

## 2023-04-08 ENCOUNTER — Ambulatory Visit: Payer: Medicaid Other | Admitting: Obstetrics & Gynecology

## 2023-04-16 ENCOUNTER — Other Ambulatory Visit: Payer: Medicaid Other

## 2023-04-17 ENCOUNTER — Telehealth: Payer: Self-pay

## 2023-04-17 NOTE — Telephone Encounter (Signed)

## 2023-04-25 NOTE — Progress Notes (Deleted)
   OBSTETRICS POSTPARTUM CLINIC PROGRESS NOTE  Subjective:     Dominique Sanders is a 27 y.o. G29P2002 female who presents for a postpartum visit. She is 6 week postpartum following a spontaneous vaginal delivery. I have fully reviewed the prenatal and intrapartum course. The delivery was at 37.3 gestational weeks.  Anesthesia: epidural. Postpartum course has been ***. Baby's course has been ***. Baby is feeding by {breast/bottle:69}. Bleeding: patient {HAS HAS ZOX:09604} not resumed menses, with No LMP recorded.. Bowel function is {normal:32111}. Bladder function is {normal:32111}. Patient {is/is not:9024} sexually active. Contraception method desired is {contraceptive method:5051}. Postpartum depression screening: {neg default:13464::"negative"}.  EDPS score is ***.    The following portions of the patient's history were reviewed and updated as appropriate: allergies, current medications, past family history, past medical history, past social history, past surgical history, and problem list.  Review of Systems {ros; complete:30496}   Objective:    There were no vitals taken for this visit.  General:  alert and no distress   Breasts:  inspection negative, no nipple discharge or bleeding, no masses or nodularity palpable  Lungs: clear to auscultation bilaterally  Heart:  regular rate and rhythm, S1, S2 normal, no murmur, click, rub or gallop  Abdomen: soft, non-tender; bowel sounds normal; no masses,  no organomegaly.  ***Well healed Pfannenstiel incision   Vulva:  normal  Vagina: normal vagina, no discharge, exudate, lesion, or erythema  Cervix:  no cervical motion tenderness and no lesions  Corpus: normal size, contour, position, consistency, mobility, non-tender  Adnexa:  normal adnexa and no mass, fullness, tenderness  Rectal Exam: Not performed.         Labs:  Lab Results  Component Value Date   HGB 11.0 (L) 03/22/2023     Assessment:   No diagnosis found.   Plan:    1.  Contraception: {method:5051} 2. Will check Hgb for h/o postpartum anemia of less than 10.  3. Follow up in: {1-10:13787} {time; units:19136} or as needed.    Hildred Laser, MD [facility]

## 2023-04-26 ENCOUNTER — Ambulatory Visit: Payer: Medicaid Other | Admitting: Obstetrics and Gynecology

## 2023-04-26 ENCOUNTER — Telehealth: Payer: Self-pay

## 2023-04-26 DIAGNOSIS — F53 Postpartum depression: Secondary | ICD-10-CM

## 2023-04-26 NOTE — Telephone Encounter (Signed)
PP nurse called and scheduled a home visit which patient rescheduled then cancelled. RN informed her of the benefits of the postpartum visit, patient still declined stating she did not need help. RN encouraged to reach out if she changed her mind.

## 2023-05-04 ENCOUNTER — Other Ambulatory Visit: Payer: Self-pay | Admitting: Obstetrics and Gynecology

## 2023-05-06 NOTE — Telephone Encounter (Signed)
Patient needs her postpartum visit rescheduled to reassess her mood symptoms.

## 2023-05-06 NOTE — Telephone Encounter (Signed)
Reached out to pt to schedule PP mood check.  Pt states that she needed to call me back.  I will call her back in the morning on 05/07/23. To schedule.

## 2023-05-06 NOTE — Telephone Encounter (Signed)
Can you schedule postpartum visit for mood check please

## 2023-05-07 ENCOUNTER — Telehealth: Payer: Self-pay | Admitting: Obstetrics and Gynecology

## 2023-05-07 NOTE — Telephone Encounter (Signed)
Reached out to pt to schedule pp mood check.  The appt is needed bc the pt is requesting a refill of a RX.  Left message for pt to call back to schedule.

## 2023-05-08 NOTE — Telephone Encounter (Signed)
I contacted patient left voicemail for the patient to  call back to be rescheduled for PP visit.

## 2023-05-15 ENCOUNTER — Encounter: Payer: Self-pay | Admitting: Obstetrics and Gynecology

## 2023-05-15 NOTE — Telephone Encounter (Signed)
Missed appointment letter to be mailed.

## 2023-07-22 ENCOUNTER — Ambulatory Visit: Payer: Medicaid Other | Admitting: Obstetrics

## 2023-10-08 ENCOUNTER — Other Ambulatory Visit: Payer: Self-pay

## 2023-10-08 ENCOUNTER — Emergency Department: Payer: Medicaid Other

## 2023-10-08 ENCOUNTER — Emergency Department
Admission: EM | Admit: 2023-10-08 | Discharge: 2023-10-08 | Disposition: A | Discharge status: Home / Self Care | Payer: Medicaid Other | Attending: Emergency Medicine | Admitting: Emergency Medicine

## 2023-10-08 DIAGNOSIS — R062 Wheezing: Secondary | ICD-10-CM

## 2023-10-08 DIAGNOSIS — J4521 Mild intermittent asthma with (acute) exacerbation: Secondary | ICD-10-CM | POA: Insufficient documentation

## 2023-10-08 DIAGNOSIS — Z1152 Encounter for screening for COVID-19: Secondary | ICD-10-CM | POA: Insufficient documentation

## 2023-10-08 DIAGNOSIS — R0602 Shortness of breath: Secondary | ICD-10-CM | POA: Diagnosis present

## 2023-10-08 LAB — CBC
HCT: 38.2 % (ref 36.0–46.0)
Hemoglobin: 12.2 g/dL (ref 12.0–15.0)
MCH: 27.5 pg (ref 26.0–34.0)
MCHC: 31.9 g/dL (ref 30.0–36.0)
MCV: 86 fL (ref 80.0–100.0)
Platelets: 392 10*3/uL (ref 150–400)
RBC: 4.44 MIL/uL (ref 3.87–5.11)
RDW: 14.4 % (ref 11.5–15.5)
WBC: 16 10*3/uL — ABNORMAL HIGH (ref 4.0–10.5)
nRBC: 0 % (ref 0.0–0.2)

## 2023-10-08 LAB — RESP PANEL BY RT-PCR (RSV, FLU A&B, COVID)  RVPGX2
Influenza A by PCR: NEGATIVE
Influenza B by PCR: NEGATIVE
Resp Syncytial Virus by PCR: NEGATIVE
SARS Coronavirus 2 by RT PCR: NEGATIVE

## 2023-10-08 LAB — COMPREHENSIVE METABOLIC PANEL
ALT: 14 U/L (ref 0–44)
AST: 15 U/L (ref 15–41)
Albumin: 3.9 g/dL (ref 3.5–5.0)
Alkaline Phosphatase: 78 U/L (ref 38–126)
Anion gap: 11 (ref 5–15)
BUN: 15 mg/dL (ref 6–20)
CO2: 24 mmol/L (ref 22–32)
Calcium: 8.8 mg/dL — ABNORMAL LOW (ref 8.9–10.3)
Chloride: 101 mmol/L (ref 98–111)
Creatinine, Ser: 0.8 mg/dL (ref 0.44–1.00)
GFR, Estimated: >60 mL/min (ref >60)
Glucose, Bld: 99 mg/dL (ref 70–99)
Potassium: 3.6 mmol/L (ref 3.5–5.1)
Sodium: 136 mmol/L (ref 135–145)
Total Bilirubin: 0.4 mg/dL (ref 0.3–1.2)
Total Protein: 7.8 g/dL (ref 6.5–8.1)

## 2023-10-08 LAB — BRAIN NATRIURETIC PEPTIDE: B Natriuretic Peptide: 26.4 pg/mL (ref 0.0–100.0)

## 2023-10-08 LAB — TROPONIN I (HIGH SENSITIVITY)
Troponin I (High Sensitivity): 3 ng/L (ref <18)
Troponin I (High Sensitivity): 3 ng/L (ref <18)

## 2023-10-08 MED ORDER — METHYLPREDNISOLONE SODIUM SUCC 125 MG IJ SOLR
125.0000 mg | Freq: Once | INTRAMUSCULAR | Status: AC
  Administered 2023-10-08: 125 mg via INTRAVENOUS
  Filled 2023-10-08: qty 2

## 2023-10-08 MED ORDER — IPRATROPIUM-ALBUTEROL 0.5-2.5 (3) MG/3ML IN SOLN
3.0000 mL | Freq: Once | RESPIRATORY_TRACT | Status: AC
  Administered 2023-10-08: 3 mL via RESPIRATORY_TRACT
  Filled 2023-10-08: qty 3

## 2023-10-08 MED ORDER — ALBUTEROL SULFATE (2.5 MG/3ML) 0.083% IN NEBU
5.0000 mg | INHALATION_SOLUTION | Freq: Once | RESPIRATORY_TRACT | Status: AC
  Administered 2023-10-08: 5 mg via RESPIRATORY_TRACT
  Filled 2023-10-08: qty 6

## 2023-10-08 MED ORDER — VENTOLIN HFA 108 (90 BASE) MCG/ACT IN AERS: 2.0000 | INHALATION_SPRAY | RESPIRATORY_TRACT | 1 refills | Status: AC | PRN

## 2023-10-08 MED ORDER — PREDNISONE 20 MG PO TABS: 60.0000 mg | ORAL_TABLET | Freq: Every day | ORAL | 0 refills | Status: AC

## 2023-10-08 MED ORDER — AZITHROMYCIN 250 MG PO TABS
ORAL_TABLET | ORAL | 0 refills | Status: DC
Start: 2023-10-08 — End: 2024-08-11

## 2023-10-08 NOTE — ED Triage Notes (Signed)
Pt presents to ER with c/o cough x3 days.  Pt reports she has become more SOB in last few days as well.  Pt states she has been using her inhaler at home without any relief.  Pt denies any hx of asthma.  Pt noted to have some difficulty speaking in full sentences, and has some exp wheezes noted in triage.  Pt is otherwise A&O x4 at this time.

## 2023-10-08 NOTE — Discharge Instructions (Signed)
For your wheezing:  Take the prednisone as prescribed Use your albuterol inhaler 2 puffs every 4 hours for 1 day, then as needed for wheezing Take the antibiotics as prescribed  Please go to the following website to schedule new (and existing) patient appointments:   [email or web address]   The following is a list of primary care offices in the area who are accepting new patients at this time.  Please reach out to one of them directly and let them know you would like to schedule an appointment to follow up on an Emergency Department visit, and/or to establish a new primary care provider (PCP).  There are likely other primary care clinics in the are who are accepting new patients, but this is an excellent place to start:  [facility] Lead physician: Dr Shirlee Latch [address] [place], [place] [address] [phone]  [facility] Lead Physician: Dr Alba Cory [address], [place], [place] [address] [phone]  [facility]  Lead Physician: Dr Olevia Perches [address] [place], [place] [address] [phone]  [facility] Lead Physician: Dr Sofie Hartigan [address], [place], [place] [address] [phone]  [facility] Primary Care & [facility] Medicine at [facility] Lead Physician: Dr Bari Edward [address] [place], [place], [place] [address] [phone]

## 2023-10-08 NOTE — ED Triage Notes (Signed)
EMS brings pt in from home for c/o cough x 3 days; nebs used at home

## 2023-10-08 NOTE — ED Provider Notes (Signed)
[facility] Provider Note    Event Date/Time   First MD Initiated Contact with Patient 10/08/23 2009     (approximate)   History   Cough and Shortness of Breath   HPI  Dominique Sanders is a 27 y.o. female here with cough and shortness of breath.  The patient states that she has a history of asthma, has been having progressively worsening shortness of breath over the last several days.  She has not had any fever.  No sputum production.  No chest pain.  She states that her wheezing has progressively worsened over the last several days so she presents for valuation.  She has tried her home albuterol without major relief.  No fevers or chills.  Severe pressure.  No chest pain.  No abdominal pain, nausea, vomiting.     Physical Exam   Triage Vital Signs: ED Triage Vitals  Encounter Vitals Group     BP 10/08/23 1930 (!) 137/100     Systolic BP Percentile --      Diastolic BP Percentile --      Pulse Rate 10/08/23 1930 (!) 115     Resp 10/08/23 1930 (!) 24     Temp 10/08/23 1930 98.5 F (36.9 C)     Temp Source 10/08/23 1930 Oral     SpO2 10/08/23 1911 95 %     Weight 10/08/23 1933 293 lb (132.9 kg)     Height 10/08/23 1933 5\' 2"  (1.575 m)     Head Circumference --      Peak Flow --      Pain Score 10/08/23 1931 0     Pain Loc --      Pain Education --      Exclude from Growth Chart --     Most recent vital signs: Vitals:   10/08/23 2200 10/08/23 2230  BP: (!) 105/44 123/85  Pulse: (!) 119 (!) 112  Resp: (!) 36 (!) 31  Temp:    SpO2: 95% 96%     General: Awake, no distress.  CV:  Good peripheral perfusion.  Tachycardia, regular.  No murmurs. Resp:  Lungs with diffuse wheezing, diminished aeration, and increased work of breathing.  Speaking in short sentences. Abd:  No distention.  No tenderness. Other:  No lower extremity edema.   ED Results / Procedures / Treatments   Labs (all labs ordered are listed, but only abnormal results are  displayed) Labs Reviewed  CBC - Abnormal; Notable for the following components:      Result Value   WBC 16.0 (*)    All other components within normal limits  COMPREHENSIVE METABOLIC PANEL - Abnormal; Notable for the following components:   Calcium 8.8 (*)    All other components within normal limits  RESP PANEL BY RT-PCR (RSV, FLU A&B, COVID)  RVPGX2  BRAIN NATRIURETIC PEPTIDE  TROPONIN I (HIGH SENSITIVITY)  TROPONIN I (HIGH SENSITIVITY)     EKG Sinus tachycardia, ventricular 112.  PR 138, cures 74, QTc 442.  No acute ST elevations or depression for Nicodemus of acute ischemia or infarct.   RADIOLOGY Chest x-ray: No active disease   I also independently reviewed and agree with radiologist interpretations.   PROCEDURES:  Critical Care performed: Yes, see critical care procedure note(s)  .Critical Care  Performed by: Shaune Pollack, MD Authorized by: Shaune Pollack, MD   Critical care provider statement:    Critical care time (minutes):  30   Critical care time was exclusive  of:  Separately billable procedures and treating other patients   Critical care was necessary to treat or prevent imminent or life-threatening deterioration of the following conditions:  Cardiac failure, circulatory failure and respiratory failure   Critical care was time spent personally by me on the following activities:  Development of treatment plan with patient or surrogate, discussions with consultants, evaluation of patient's response to treatment, examination of patient, ordering and review of laboratory studies, ordering and review of radiographic studies, ordering and performing treatments and interventions, pulse oximetry, re-evaluation of patient's condition and review of old charts     MEDICATIONS ORDERED IN ED: Medications  ipratropium-albuterol (DUONEB) 0.5-2.5 (3) MG/3ML nebulizer solution 3 mL (3 mLs Nebulization Given 10/08/23 2118)  ipratropium-albuterol (DUONEB) 0.5-2.5 (3)  MG/3ML nebulizer solution 3 mL (3 mLs Nebulization Given 10/08/23 2117)  ipratropium-albuterol (DUONEB) 0.5-2.5 (3) MG/3ML nebulizer solution 3 mL (3 mLs Nebulization Given 10/08/23 2117)  methylPREDNISolone sodium succinate (SOLU-MEDROL) 125 mg/2 mL injection 125 mg (125 mg Intravenous Given 10/08/23 2116)  albuterol (PROVENTIL) (2.5 MG/3ML) 0.083% nebulizer solution 5 mg (5 mg Nebulization Given 10/08/23 2309)     IMPRESSION / MDM / ASSESSMENT AND PLAN / ED COURSE  I reviewed the triage vital signs and the nursing notes.                              Differential diagnosis includes, but is not limited to, asthma exacerbation, URI, PNA, CHF  Patient's presentation is most consistent with acute presentation with potential threat to life or bodily function.  The patient is on the cardiac monitor to evaluate for evidence of arrhythmia and/or significant heart rate changes  27 yo F with h/o asthma here with wheezing, SOB. Pt initially with increased WOB and very diminished aeration. She was given IV steroids, duonebs x 3 with excellent effect. She is now breathing comfortably, and is ambulatory without hypoxia. Tachycardia remains likely from the albuterol, and she states she feels much better. CXR is clear. Labs unremarkable. Will place on PO steroids, albuterol, and also cover with azithro as she's had sputum production. Return precautions given.    FINAL CLINICAL IMPRESSION(S) / ED DIAGNOSES   Final diagnoses:  Mild intermittent asthma with exacerbation  Wheezing     Rx / DC Orders   ED Discharge Orders          Ordered    predniSONE (DELTASONE) 20 MG tablet  Daily        10/08/23 2300    albuterol (VENTOLIN HFA) 108 (90 Base) MCG/ACT inhaler  Every 4 hours PRN        10/08/23 2300    azithromycin (ZITHROMAX Z-PAK) 250 MG tablet        10/08/23 2300             Note:  This document was prepared using Dragon voice recognition software and may include unintentional  dictation errors.   Shaune Pollack, MD 10/09/23 [phone]

## 2023-10-08 NOTE — ED Notes (Signed)
Walking pulse ox was 93%. Prior to ambulating, pt's o2 sat was 98%

## 2023-11-06 ENCOUNTER — Ambulatory Visit: Payer: Medicaid Other | Admitting: Obstetrics and Gynecology

## 2023-11-06 NOTE — Progress Notes (Deleted)
   GYNECOLOGY CLINIC PROGRESS NOTE Subjective:     Dominique Sanders is a 27 y.o. female here for discussion regarding weight loss. She has noted a weight gain of approximately *** pounds over the last {1-10:13787} {time; units:10300::"years"}. She feels ideal weight is *** pounds. Weight at graduation from high school was *** pounds. History of eating disorders: {eating disorders:12511::"none"}. There is a family history positive for obesity in the {family:120002}. Previous treatments for obesity include {obesity treatment:12513}. Obesity associated medical conditions: {diagnoses:12512}. Obesity associated medications: {obesity assoc meds:1214::"none"}. Cardiovascular risk factors besides obesity: {risks factors:510}.  The following portions of the patient's history were reviewed and updated as appropriate: She  has a past medical history of Diabetes mellitus without complication (HCC), History of gestational diabetes (10/15/2019), Preeclampsia (01/08/2020), and Trichomonal vaginitis during pregnancy in first trimester (09/12/2022). Her family history includes Healthy in her father and mother. Current Outpatient Medications on File Prior to Visit  Medication Sig Dispense Refill   albuterol (VENTOLIN HFA) 108 (90 Base) MCG/ACT inhaler Inhale 2 puffs into the lungs every 4 (four) hours as needed for wheezing or shortness of breath. 18 g 1   azithromycin (ZITHROMAX Z-PAK) 250 MG tablet Take 2 tablets (500 mg) on  Day 1,  followed by 1 tablet (250 mg) once daily on Days 2 through 5. 6 each 0   sertraline (ZOLOFT) 50 MG tablet Take 1 tablet (50 mg total) by mouth daily. 90 tablet 0   No current facility-administered medications on file prior to visit.   She has No Known Allergies..  Review of Systems Pertinent items noted in HPI and remainder of comprehensive ROS otherwise negative.   Objective:   Last menstrual period 09/20/2023, not currently breastfeeding. There is no height or weight on file to  calculate BMI. General appearance: {general exam:16600} Lungs: {lung exam:16931} Heart: {heart exam:5510} Abdomen: {abdominal exam:16834}.  Waist circumference *** inches.  Extremities: {extremity exam:5109} Neurologic: {neuro exam:17854}   Assessment:   Obesity, Class ***. I assessed Dominique Sanders to be in {stages:16440} stage with respect to weight loss.   Plan:   {VHQI:69629}    Hildred Laser, MD [facility]

## 2023-12-03 NOTE — Progress Notes (Unsigned)
   GYNECOLOGY CLINIC PROGRESS NOTE Subjective:     Dominique Sanders is a 27 y.o. female here for discussion regarding weight loss. She has noted a weight gain of approximately *** pounds over the last {1-10:13787} {time; units:10300::"years"}. She feels ideal weight is *** pounds. Weight at graduation from high school was *** pounds. History of eating disorders: {eating disorders:12511::"none"}. There is a family history positive for obesity in the {family:120002}. Previous treatments for obesity include {obesity treatment:12513}. Obesity associated medical conditions: {diagnoses:12512}. Obesity associated medications: {obesity assoc meds:1214::"none"}. Cardiovascular risk factors besides obesity: {risks factors:510}.  The following portions of the patient's history were reviewed and updated as appropriate: She  has a past medical history of Diabetes mellitus without complication (HCC), History of gestational diabetes (10/15/2019), Preeclampsia (01/08/2020), and Trichomonal vaginitis during pregnancy in first trimester (09/12/2022). Her family history includes Healthy in her father and mother. Current Outpatient Medications on File Prior to Visit  Medication Sig Dispense Refill   albuterol (VENTOLIN HFA) 108 (90 Base) MCG/ACT inhaler Inhale 2 puffs into the lungs every 4 (four) hours as needed for wheezing or shortness of breath. 18 g 1   azithromycin (ZITHROMAX Z-PAK) 250 MG tablet Take 2 tablets (500 mg) on  Day 1,  followed by 1 tablet (250 mg) once daily on Days 2 through 5. 6 each 0   sertraline (ZOLOFT) 50 MG tablet Take 1 tablet (50 mg total) by mouth daily. 90 tablet 0   No current facility-administered medications on file prior to visit.   She has No Known Allergies..  Review of Systems Pertinent items noted in HPI and remainder of comprehensive ROS otherwise negative.   Objective:   not currently breastfeeding. There is no height or weight on file to calculate BMI. General appearance:  {general exam:16600} Lungs: {lung exam:16931} Heart: {heart exam:5510} Abdomen: {abdominal exam:16834}.  Waist circumference *** inches.  Extremities: {extremity exam:5109} Neurologic: {neuro exam:17854}   Assessment:   Obesity, Class ***. I assessed Dominique Sanders to be in {stages:16440} stage with respect to weight loss.   Plan:   {ZOXW:96045}    Dominique Laser, MD [facility]

## 2023-12-04 ENCOUNTER — Ambulatory Visit: Payer: MEDICAID | Admitting: Obstetrics and Gynecology

## 2023-12-12 ENCOUNTER — Encounter: Payer: Self-pay | Admitting: Obstetrics and Gynecology

## 2024-01-08 ENCOUNTER — Encounter: Payer: Self-pay | Admitting: Obstetrics and Gynecology

## 2024-08-10 ENCOUNTER — Observation Stay
Admission: EM | Admit: 2024-08-10 | Discharge: 2024-08-12 | Disposition: A | Discharge status: Home / Self Care | Payer: MEDICAID | Attending: Internal Medicine | Admitting: Internal Medicine

## 2024-08-10 ENCOUNTER — Other Ambulatory Visit: Payer: Self-pay

## 2024-08-10 DIAGNOSIS — R0602 Shortness of breath: Secondary | ICD-10-CM | POA: Diagnosis present

## 2024-08-10 DIAGNOSIS — Z794 Long term (current) use of insulin: Secondary | ICD-10-CM | POA: Insufficient documentation

## 2024-08-10 DIAGNOSIS — Z6841 Body Mass Index (BMI) 40.0 and over, adult: Secondary | ICD-10-CM | POA: Insufficient documentation

## 2024-08-10 DIAGNOSIS — E66813 Obesity, class 3: Secondary | ICD-10-CM | POA: Insufficient documentation

## 2024-08-10 DIAGNOSIS — Z79899 Other long term (current) drug therapy: Secondary | ICD-10-CM | POA: Diagnosis not present

## 2024-08-10 DIAGNOSIS — J45901 Unspecified asthma with (acute) exacerbation: Secondary | ICD-10-CM | POA: Diagnosis present

## 2024-08-10 DIAGNOSIS — E119 Type 2 diabetes mellitus without complications: Secondary | ICD-10-CM | POA: Diagnosis not present

## 2024-08-10 DIAGNOSIS — J4521 Mild intermittent asthma with (acute) exacerbation: Principal | ICD-10-CM | POA: Insufficient documentation

## 2024-08-10 DIAGNOSIS — Z1152 Encounter for screening for COVID-19: Secondary | ICD-10-CM | POA: Insufficient documentation

## 2024-08-10 NOTE — ED Triage Notes (Addendum)
 Patient brought in by home by EMS for cough x2 weeks. O2 94 RA on EMS arrival, bil wheeze with EMS, 1 duo neb given, O2 97 post duo neb. Hx asthma. Pt AxOx4 in triage, pt states she coughed so much she threw up, difficulty speaking due to coughing. Denies SoB, tachycardic in triage.

## 2024-08-11 ENCOUNTER — Emergency Department: Payer: MEDICAID

## 2024-08-11 ENCOUNTER — Encounter: Payer: Self-pay | Admitting: Family Medicine

## 2024-08-11 DIAGNOSIS — J45901 Unspecified asthma with (acute) exacerbation: Secondary | ICD-10-CM | POA: Diagnosis present

## 2024-08-11 LAB — CBC WITH DIFFERENTIAL/PLATELET
Abs Immature Granulocytes: 0.07 K/uL (ref 0.00–0.07)
Basophils Absolute: 0.1 K/uL (ref 0.0–0.1)
Basophils Relative: 0 %
Eosinophils Absolute: 0.8 K/uL — ABNORMAL HIGH (ref 0.0–0.5)
Eosinophils Relative: 5 %
HCT: 35.4 % — ABNORMAL LOW (ref 36.0–46.0)
Hemoglobin: 11.2 g/dL — ABNORMAL LOW (ref 12.0–15.0)
Immature Granulocytes: 0 %
Lymphocytes Relative: 15 %
Lymphs Abs: 2.3 K/uL (ref 0.7–4.0)
MCH: 27.7 pg (ref 26.0–34.0)
MCHC: 31.6 g/dL (ref 30.0–36.0)
MCV: 87.4 fL (ref 80.0–100.0)
Monocytes Absolute: 0.7 K/uL (ref 0.1–1.0)
Monocytes Relative: 4 %
Neutro Abs: 11.7 K/uL — ABNORMAL HIGH (ref 1.7–7.7)
Neutrophils Relative %: 76 %
Platelets: 341 K/uL (ref 150–400)
RBC: 4.05 MIL/uL (ref 3.87–5.11)
RDW: 14.3 % (ref 11.5–15.5)
WBC: 15.6 K/uL — ABNORMAL HIGH (ref 4.0–10.5)
nRBC: 0 % (ref 0.0–0.2)

## 2024-08-11 LAB — BASIC METABOLIC PANEL WITH GFR
Anion gap: 10 (ref 5–15)
BUN: 13 mg/dL (ref 6–20)
CO2: 27 mmol/L (ref 22–32)
Calcium: 8.9 mg/dL (ref 8.9–10.3)
Chloride: 100 mmol/L (ref 98–111)
Creatinine, Ser: 0.78 mg/dL (ref 0.44–1.00)
GFR, Estimated: >60 mL/min (ref >60)
Glucose, Bld: 109 mg/dL — ABNORMAL HIGH (ref 70–99)
Potassium: 3.6 mmol/L (ref 3.5–5.1)
Sodium: 137 mmol/L (ref 135–145)

## 2024-08-11 LAB — HEMOGLOBIN A1C
Hgb A1c MFr Bld: 5.2 % (ref 4.8–5.6)
Mean Plasma Glucose: 102.54 mg/dL

## 2024-08-11 LAB — TROPONIN I (HIGH SENSITIVITY): Troponin I (High Sensitivity): 3 ng/L (ref <18)

## 2024-08-11 LAB — GLUCOSE, CAPILLARY
Glucose-Capillary: 212 mg/dL — ABNORMAL HIGH (ref 70–99)
Glucose-Capillary: 167 mg/dL — ABNORMAL HIGH (ref 70–99)
Glucose-Capillary: 164 mg/dL — ABNORMAL HIGH (ref 70–99)
Glucose-Capillary: 214 mg/dL — ABNORMAL HIGH (ref 70–99)

## 2024-08-11 LAB — CBG MONITORING, ED: Glucose-Capillary: 208 mg/dL — ABNORMAL HIGH (ref 70–99)

## 2024-08-11 LAB — RESP PANEL BY RT-PCR (RSV, FLU A&B, COVID)  RVPGX2
Influenza A by PCR: NEGATIVE
Influenza B by PCR: NEGATIVE
Resp Syncytial Virus by PCR: NEGATIVE
SARS Coronavirus 2 by RT PCR: NEGATIVE

## 2024-08-11 LAB — HCG, QUANTITATIVE, PREGNANCY: hCG, Beta Chain, Quant, S: <1 m[IU]/mL (ref <5)

## 2024-08-11 LAB — HIV ANTIBODY (ROUTINE TESTING W REFLEX): HIV Screen 4th Generation wRfx: NONREACTIVE

## 2024-08-11 MED ORDER — ACETAMINOPHEN 325 MG PO TABS: 650.0000 mg | ORAL_TABLET | Freq: Four times a day (QID) | ORAL | Status: DC | PRN

## 2024-08-11 MED ORDER — LORATADINE 10 MG PO TABS
5.0000 mg | ORAL_TABLET | Freq: Every day | ORAL | Status: DC
  Administered 2024-08-11 – 2024-08-12 (×2): 5 mg via ORAL
  Filled 2024-08-11 (×2): qty 1

## 2024-08-11 MED ORDER — ENOXAPARIN SODIUM 80 MG/0.8ML IJ SOSY
70.0000 mg | PREFILLED_SYRINGE | Freq: Two times a day (BID) | INTRAMUSCULAR | Status: DC
  Filled 2024-08-11 (×3): qty 0.7

## 2024-08-11 MED ORDER — METHYLPREDNISOLONE SODIUM SUCC 125 MG IJ SOLR
125.0000 mg | Freq: Once | INTRAMUSCULAR | Status: AC
  Administered 2024-08-11: 125 mg via INTRAMUSCULAR
  Filled 2024-08-11: qty 2

## 2024-08-11 MED ORDER — HYDROCOD POLI-CHLORPHE POLI ER 10-8 MG/5ML PO SUER: 5.0000 mL | Freq: Two times a day (BID) | ORAL | Status: DC | PRN

## 2024-08-11 MED ORDER — INSULIN ASPART 100 UNIT/ML IJ SOLN
0.0000 [IU] | Freq: Every day | INTRAMUSCULAR | Status: DC
  Administered 2024-08-11: 2 [IU] via SUBCUTANEOUS
  Filled 2024-08-11: qty 1

## 2024-08-11 MED ORDER — IOHEXOL 350 MG/ML SOLN
100.0000 mL | Freq: Once | INTRAVENOUS | Status: AC | PRN
  Administered 2024-08-11: 100 mL via INTRAVENOUS

## 2024-08-11 MED ORDER — HYDROCOD POLI-CHLORPHE POLI ER 10-8 MG/5ML PO SUER
5.0000 mL | Freq: Once | ORAL | Status: AC
  Administered 2024-08-11: 5 mL via ORAL
  Filled 2024-08-11: qty 5

## 2024-08-11 MED ORDER — SODIUM CHLORIDE 0.9% FLUSH
3.0000 mL | Freq: Two times a day (BID) | INTRAVENOUS | Status: DC
  Administered 2024-08-11 – 2024-08-12 (×3): 3 mL via INTRAVENOUS

## 2024-08-11 MED ORDER — LEVOCETIRIZINE DIHYDROCHLORIDE 5 MG PO TABS: 5.0000 mg | ORAL_TABLET | Freq: Every day | ORAL | Status: DC

## 2024-08-11 MED ORDER — ALBUTEROL SULFATE (2.5 MG/3ML) 0.083% IN NEBU
5.0000 mg | INHALATION_SOLUTION | Freq: Once | RESPIRATORY_TRACT | Status: AC
  Administered 2024-08-11: 5 mg via RESPIRATORY_TRACT
  Filled 2024-08-11: qty 6

## 2024-08-11 MED ORDER — IPRATROPIUM-ALBUTEROL 0.5-2.5 (3) MG/3ML IN SOLN: 3.0000 mL | RESPIRATORY_TRACT | Status: DC | PRN

## 2024-08-11 MED ORDER — ACETAMINOPHEN 650 MG RE SUPP: 650.0000 mg | Freq: Four times a day (QID) | RECTAL | Status: DC | PRN

## 2024-08-11 MED ORDER — FLUTICASONE PROPIONATE 50 MCG/ACT NA SUSP
2.0000 | Freq: Two times a day (BID) | NASAL | Status: DC
  Administered 2024-08-11: 2 via NASAL
  Filled 2024-08-11 (×2): qty 16

## 2024-08-11 MED ORDER — DEXTROMETHORPHAN POLISTIREX ER 30 MG/5ML PO SUER
30.0000 mg | Freq: Once | ORAL | Status: AC
  Administered 2024-08-11: 30 mg via ORAL
  Filled 2024-08-11: qty 5

## 2024-08-11 MED ORDER — MONTELUKAST SODIUM 10 MG PO TABS
10.0000 mg | ORAL_TABLET | Freq: Every day | ORAL | Status: DC
  Administered 2024-08-11 – 2024-08-12 (×2): 10 mg via ORAL
  Filled 2024-08-11 (×2): qty 1

## 2024-08-11 MED ORDER — BUDESONIDE 0.25 MG/2ML IN SUSP
0.2500 mg | Freq: Two times a day (BID) | RESPIRATORY_TRACT | Status: DC
  Administered 2024-08-11 – 2024-08-12 (×2): 0.25 mg via RESPIRATORY_TRACT
  Filled 2024-08-11 (×2): qty 2

## 2024-08-11 MED ORDER — ONDANSETRON HCL 4 MG PO TABS
4.0000 mg | ORAL_TABLET | Freq: Four times a day (QID) | ORAL | Status: DC | PRN
Start: 2024-08-11 — End: 2024-08-12

## 2024-08-11 MED ORDER — INSULIN ASPART 100 UNIT/ML IJ SOLN
0.0000 [IU] | Freq: Three times a day (TID) | INTRAMUSCULAR | Status: DC
  Administered 2024-08-11 (×2): 3 [IU] via SUBCUTANEOUS
  Administered 2024-08-12: 2 [IU] via SUBCUTANEOUS
  Filled 2024-08-11 (×3): qty 1

## 2024-08-11 MED ORDER — INSULIN ASPART 100 UNIT/ML IJ SOLN
5.0000 [IU] | Freq: Once | INTRAMUSCULAR | Status: AC
  Administered 2024-08-11: 5 [IU] via SUBCUTANEOUS
  Filled 2024-08-11: qty 1

## 2024-08-11 MED ORDER — ONDANSETRON HCL 4 MG/2ML IJ SOLN: 4.0000 mg | Freq: Four times a day (QID) | INTRAMUSCULAR | Status: DC | PRN

## 2024-08-11 MED ORDER — METHYLPREDNISOLONE SODIUM SUCC 40 MG IJ SOLR: 40.0000 mg | INTRAMUSCULAR | Status: DC

## 2024-08-11 MED ORDER — SODIUM CHLORIDE 0.9% FLUSH: 3.0000 mL | INTRAVENOUS | Status: DC | PRN

## 2024-08-11 MED ORDER — IPRATROPIUM BROMIDE 0.02 % IN SOLN
0.5000 mg | Freq: Once | RESPIRATORY_TRACT | Status: AC
  Administered 2024-08-11: 0.5 mg via RESPIRATORY_TRACT
  Filled 2024-08-11: qty 2.5

## 2024-08-11 MED ORDER — IPRATROPIUM-ALBUTEROL 0.5-2.5 (3) MG/3ML IN SOLN
3.0000 mL | Freq: Once | RESPIRATORY_TRACT | Status: AC
  Administered 2024-08-11: 3 mL via RESPIRATORY_TRACT
  Filled 2024-08-11: qty 3

## 2024-08-11 MED ORDER — VITAMIN D (ERGOCALCIFEROL) 1.25 MG (50000 UNIT) PO CAPS
50000.0000 [IU] | ORAL_CAPSULE | ORAL | Status: DC
  Administered 2024-08-11: 50000 [IU] via ORAL
  Filled 2024-08-11 (×2): qty 1

## 2024-08-11 MED ORDER — POLYETHYLENE GLYCOL 3350 17 G PO PACK: 17.0000 g | PACK | Freq: Every day | ORAL | Status: DC | PRN

## 2024-08-11 MED ORDER — SERTRALINE HCL 50 MG PO TABS
100.0000 mg | ORAL_TABLET | Freq: Every day | ORAL | Status: DC
  Administered 2024-08-11: 100 mg via ORAL
  Filled 2024-08-11: qty 2

## 2024-08-11 MED ORDER — SODIUM CHLORIDE 0.9 % IV SOLN: 250.0000 mL | INTRAVENOUS | Status: AC | PRN

## 2024-08-11 MED ORDER — ONDANSETRON 4 MG PO TBDP
4.0000 mg | ORAL_TABLET | Freq: Once | ORAL | Status: AC
  Administered 2024-08-11: 4 mg via ORAL
  Filled 2024-08-11: qty 1

## 2024-08-11 MED ORDER — METHYLPREDNISOLONE SODIUM SUCC 40 MG IJ SOLR
40.0000 mg | INTRAMUSCULAR | Status: DC
  Administered 2024-08-11 – 2024-08-12 (×2): 40 mg via INTRAVENOUS
  Filled 2024-08-11 (×2): qty 1

## 2024-08-11 MED ORDER — SODIUM CHLORIDE 0.9 % IV BOLUS (SEPSIS)
1000.0000 mL | Freq: Once | INTRAVENOUS | Status: AC
  Administered 2024-08-11: 1000 mL via INTRAVENOUS

## 2024-08-11 NOTE — H&P (Addendum)
 [facility] History and Physical  Dominique Sanders FMW:[id number] DOB: October 17, 1996 DOA: 08/10/2024  Referring physician: ED  PCP: Pcp, No   Patient is coming from: Home  Chief Complaint: Cough and shortness of breath  HPI:   Patient is a 28 years old female with history of obesity, type 2 diabetes, asthma presented to hospital with cough shortness of breath, wheezing and subjective fever and fatigue. Patient also complained of chest discomfort.  Despite breathing treatment by EMS, patient did not have relief so presented to the ED. patient denies any dizziness lightheadedness or syncope.  Denies any nausea vomiting or abdominal pain.  Denies any urinary urgency, frequency or dysuria.   In the ED patient was tachycardic, tachypneic.  Afebrile.  Initial labs were notable for WBC at 15.6 with some eosinophilia.  EKG showed sinus tachycardia.  Chest x-ray without infiltrate.  CT angiogram of the chest was performed and there was no pulmonary embolism or acute infiltrate.  COVID influenza and RSV was negative.  Patient was then considered for admission to the hospital for further evaluation and treatment.   Assessment and Plan Principal Problem:   Acute asthma exacerbation Active Problems:   BMI 50.0-59.9, adult (HCC)   Acute asthma exacerbation.  CTA of the chest and chest x-ray negative for infiltrate PE.  Will continue with the bronchodilators, IV steroids inhaled steroids and supportive care.  Patient is only on albuterol  inhaler at home..  Might need additional inhaler on discharge.  Class III obesity. Body mass index is 57.07 kg/m.  Will benefit from lifestyle modification and weight loss.  History of ype 2 diabetes we will closely monitor blood glucose levels while on steroids.  Add sliding scale insulin .  Check hemoglobin A1c.  DVT Prophylaxis: lovenox  subq  Review of Systems:  All systems were reviewed and were negative unless otherwise mentioned in the HPI   Past Medical  History:  Diagnosis Date   Diabetes mellitus without complication (HCC)    History of gestational diabetes 10/15/2019   Preeclampsia 01/08/2020   Results for orders placed or performed during the hospital encounter of 01/07/20 (from the past 24 hour(s)) Urinalysis, Routine w reflex microscopic     Status: Abnormal  Collection Time: 01/07/20 11:20 PM Result Value Ref Range  Color, Urine STRAW (A) YELLOW  APPearance CLEAR CLEAR  Specific Gravity, Urine 1.009 1.005 - 1.030  pH 7.0 5.0 - 8.0  Glucose, UA >=500 (A) NEGATIVE mg/dL  Hgb urine dips   Trichomonal vaginitis during pregnancy in first trimester 09/12/2022   Past Surgical History:  Procedure Laterality Date   NO PAST SURGERIES     no surgical       Social History:  reports that she has never smoked. She has never used smokeless tobacco. She reports that she does not currently use alcohol. She reports that she does not use drugs.  No Known Allergies  Family History  Problem Relation Age of Onset   Healthy Mother    Healthy Father      Prior to Admission medications   Medication Sig Start Date End Date Taking? Authorizing Provider  albuterol  (VENTOLIN  HFA) 108 (90 Base) MCG/ACT inhaler Inhale 2 puffs into the lungs every 4 (four) hours as needed for wheezing or shortness of breath. 10/08/23  Yes Angelena Smalls, MD  fluticasone  (FLONASE ) 50 MCG/ACT nasal spray Place 2 sprays into both nostrils 2 (two) times daily.   Yes [provider]  levocetirizine (XYZAL ) 5 MG tablet Take 5 mg by mouth daily.  Yes [provider]  montelukast  (SINGULAIR ) 10 MG tablet Take 10 mg by mouth daily.   Yes [provider]  Pseudoeph-Doxylamine -DM-APAP (NYQUIL PO) Take 30 mLs by mouth every 6 (six) hours.   Yes [provider]  sertraline  (ZOLOFT ) 100 MG tablet Take 100 mg by mouth at bedtime.   Yes [provider]  Vitamin D , Ergocalciferol , (DRISDOL ) 1.25 MG (50000 UNIT) CAPS capsule Take 50,000 Units by  mouth once a week.   Yes [provider]  WEGOVY 0.5 MG/0.5ML SOAJ SQ injection Inject 0.5 mg into the skin once a week. 06/18/24  Yes [provider]    Physical Exam:  Vitals:   08/11/24 0430 08/11/24 0500 08/11/24 0530 08/11/24 0617  BP: 124/69 126/65 112/71   Pulse: (!) 103 (!) 103 100   Resp: (!) 22 (!) 22 20   Temp:    98.2 F (36.8 C)  TempSrc:    Oral  SpO2: 93% 93% 94%   Weight:      Height:       Wt Readings from Last 3 Encounters:  08/10/24 (!) 141.5 kg  10/08/23 132.9 kg  04/05/23 (!) 145.6 kg   Body mass index is 57.07 kg/m.  General: Obese built, not in obvious distress HENT: Normocephalic, No scleral pallor or icterus noted. Oral mucosa is moist.  Chest: Diminished breath sounds bilaterally.  Mild wheezing noted in expiration. CVS: S1 &S2 heard. No murmur.  Regular rate and rhythm. Abdomen: Soft, nontender obese abdomen bowel sounds are heard. No abdominal mass palpated Extremities: No cyanosis, clubbing or edema.  Peripheral pulses are palpable. Psych: Alert, awake and oriented, normal mood CNS:  No cranial nerve deficits.  Power equal in all extremities.   Skin: Warm and dry.  No rashes noted.  Labs on Admission:   CBC: Recent Labs  Lab 08/11/24 0154  WBC 15.6*  NEUTROABS 11.7*  HGB 11.2*  HCT 35.4*  MCV 87.4  PLT 341    Basic Metabolic Panel: Recent Labs  Lab 08/11/24 0154  NA 137  K 3.6  CL 100  CO2 27  GLUCOSE 109*  BUN 13  CREATININE 0.78  CALCIUM  8.9    Liver Function Tests: No results for input(s): AST, ALT, ALKPHOS, BILITOT, PROT, ALBUMIN in the last 168 hours. No results for input(s): LIPASE, AMYLASE in the last 168 hours. No results for input(s): AMMONIA  in the last 168 hours.  Cardiac Enzymes: No results for input(s): CKTOTAL, CKMB, CKMBINDEX, TROPONINI in the last 168 hours.  BNP (last 3 results) Recent Labs    10/08/23 1937  BNP 26.4    ProBNP (last 3 results) No  results for input(s): PROBNP in the last 8760 hours.  CBG: No results for input(s): GLUCAP in the last 168 hours.  Lipase  No results found for: LIPASE   Urinalysis    Component Value Date/Time   COLORURINE YELLOW (A) 02/26/2023 1123   APPEARANCEUR HAZY (A) 02/26/2023 1123   APPEARANCEUR Cloudy (A) 08/13/2022 1530   LABSPEC 1.015 02/26/2023 1123   PHURINE 6.0 02/26/2023 1123   GLUCOSEU Trace (A) 03/12/2023 1452   GLUCOSEU NEGATIVE 02/26/2023 1123   HGBUR LARGE (A) 02/26/2023 1123   BILIRUBINUR Negative 03/12/2023 1452   BILIRUBINUR Negative 08/13/2022 1530   KETONESUR 20 (A) 02/26/2023 1123   PROTEINUR 30 (A) 02/26/2023 1123   UROBILINOGEN 0.2 03/12/2023 1452   NITRITE Negative 03/12/2023 1452   NITRITE NEGATIVE 02/26/2023 1123   LEUKOCYTESUR Moderate (2+) (A) 03/12/2023 1452  LEUKOCYTESUR LARGE (A) 02/26/2023 1123     Drugs of Abuse  No results found for: LABOPIA, COCAINSCRNUR, LABBENZ, AMPHETMU, THCU, LABBARB    Radiological Exams on Admission: CT Angio Chest PE W and/or Wo Contrast Result Date: 08/11/2024 CLINICAL DATA:  Pulmonary embolism (PE) suspected, high prob. Cough, shortness of breath. EXAM: CT ANGIOGRAPHY CHEST WITH CONTRAST TECHNIQUE: Multidetector CT imaging of the chest was performed using the standard protocol during bolus administration of intravenous contrast. Multiplanar CT image reconstructions and MIPs were obtained to evaluate the vascular anatomy. RADIATION DOSE REDUCTION: This exam was performed according to the departmental dose-optimization program which includes automated exposure control, adjustment of the mA and/or kV according to patient size and/or use of iterative reconstruction technique. CONTRAST:  OMNIPAQUE  IOHEXOL  350 MG/ML SOLN COMPARISON:  Chest x-ray today. FINDINGS: Cardiovascular: No filling defects in the pulmonary arteries to suggest pulmonary emboli. Heart is normal size. Aorta is normal caliber.  Mediastinum/Nodes: No mediastinal, hilar, or axillary adenopathy. Trachea and esophagus are unremarkable. Thyroid unremarkable. Lungs/Pleura: Lungs are clear. No focal airspace opacities or suspicious nodules. No effusions. Upper Abdomen: No acute findings Musculoskeletal: Chest wall soft tissues are unremarkable. No acute bony abnormality. Review of the MIP images confirms the above findings. IMPRESSION: No evidence of pulmonary embolus. No acute cardiopulmonary disease. Electronically Signed   By: Franky Crease M.D.   On: 08/11/2024 03:25   DG Chest Portable 1 View Result Date: 08/11/2024 CLINICAL DATA:  Shortness of breath and cough EXAM: PORTABLE CHEST 1 VIEW COMPARISON:  10/08/2023 FINDINGS: The heart size and mediastinal contours are within normal limits. Both lungs are clear. The visualized skeletal structures are unremarkable. IMPRESSION: No active disease. Electronically Signed   By: Norman Gatlin M.D.   On: 08/11/2024 00:25    EKG: Personally reviewed by me which shows sinus tachycardia   Consultant: None  Code Status: Full code  Microbiology none  Antibiotics: None  Family Communication:  Patients' condition and plan of care including tests being ordered have been discussed with the patient who indicate understanding and agree with the plan.   Status is: Observation   Severity of Illness: The appropriate patient status for this patient is OBSERVATION. Observation status is judged to be reasonable and necessary in order to provide the required intensity of service to ensure the patient's safety. The patient's presenting symptoms, physical exam findings, and initial radiographic and laboratory data in the context of their medical condition is felt to place them at decreased risk for further clinical deterioration. Furthermore, it is anticipated that the patient will be medically stable for discharge from the hospital within 2 midnights of admission.   Signed, Vernal Alstrom,  MD [facility] 08/11/2024

## 2024-08-11 NOTE — ED Provider Notes (Signed)
 [facility] Provider Note    Event Date/Time   First MD Initiated Contact with Patient 08/10/24 2344     (approximate)   History   Cough   HPI  Dominique Sanders is a 28 y.o. female with history of obesity, diabetes, asthma who presents to the emergency department with wheezing, nonproductive cough, subjective fevers, chest pain and shortness of breath.  Given breathing treatments with EMS without relief.  She is tachycardic, tachypneic but not hypoxic.  Denies history of PE, DVT.  No calf tenderness or calf swelling.  States she has been coughing for weeks.  She has had posttussive emesis.   History provided by patient, EMS.    Past Medical History:  Diagnosis Date   Diabetes mellitus without complication (HCC)    History of gestational diabetes 10/15/2019   Preeclampsia 01/08/2020   Results for orders placed or performed during the hospital encounter of 01/07/20 (from the past 24 hour(s)) Urinalysis, Routine w reflex microscopic     Status: Abnormal  Collection Time: 01/07/20 11:20 PM Result Value Ref Range  Color, Urine STRAW (A) YELLOW  APPearance CLEAR CLEAR  Specific Gravity, Urine 1.009 1.005 - 1.030  pH 7.0 5.0 - 8.0  Glucose, UA >=500 (A) NEGATIVE mg/dL  Hgb urine dips   Trichomonal vaginitis during pregnancy in first trimester 09/12/2022    Past Surgical History:  Procedure Laterality Date   NO PAST SURGERIES     no surgical       MEDICATIONS:  Prior to Admission medications   Medication Sig Start Date End Date Taking? Authorizing Provider  albuterol  (VENTOLIN  HFA) 108 (90 Base) MCG/ACT inhaler Inhale 2 puffs into the lungs every 4 (four) hours as needed for wheezing or shortness of breath. 10/08/23   Angelena Smalls, MD  azithromycin  (ZITHROMAX  Z-PAK) 250 MG tablet Take 2 tablets (500 mg) on  Day 1,  followed by 1 tablet (250 mg) once daily on Days 2 through 5. 10/08/23   Angelena Smalls, MD  sertraline  (ZOLOFT ) 50 MG tablet Take 1 tablet (50 mg  total) by mouth daily. 05/06/23   Connell Davies, MD    Physical Exam   Triage Vital Signs: ED Triage Vitals  Encounter Vitals Group     BP 08/10/24 2349 (!) 178/86     Girls Systolic BP Percentile --      Girls Diastolic BP Percentile --      Boys Systolic BP Percentile --      Boys Diastolic BP Percentile --      Pulse Rate 08/10/24 2353 (!) 108     Resp 08/10/24 2349 (!) 30     Temp 08/10/24 2349 97.9 F (36.6 C)     Temp Source 08/10/24 2349 Oral     SpO2 08/10/24 2349 96 %     Weight 08/10/24 2350 (!) 312 lb (141.5 kg)     Height 08/10/24 2350 5' 2 (1.575 m)     Head Circumference --      Peak Flow --      Pain Score 08/10/24 2350 4     Pain Loc --      Pain Education --      Exclude from Growth Chart --     Most recent vital signs: Vitals:   08/11/24 0300 08/11/24 0330  BP: 133/84 (!) 112/58  Pulse: (!) 120 (!) 105  Resp: 19 (!) 26  Temp:    SpO2: 94% 96%    CONSTITUTIONAL: Alert, responds appropriately to  questions.  Obese, appears uncomfortable HEAD: Normocephalic, atraumatic EYES: Conjunctivae clear, pupils appear equal, sclera nonicteric ENT: normal nose; moist mucous membranes NECK: Supple, normal ROM CARD: Regular and tachycardic; S1 and S2 appreciated RESP: Patient is tachypneic, speaking in truncated sentences, no hypoxia, mild diminished aeration at bases bilaterally with scattered expiratory wheezes, no rhonchi or rales ABD/GI: Non-distended; soft, non-tender, no rebound, no guarding, no peritoneal signs BACK: The back appears normal EXT: Normal ROM in all joints; no deformity noted, no edema, no calf tenderness or calf swelling SKIN: Normal color for age and race; warm; no rash on exposed skin NEURO: Moves all extremities equally, normal speech PSYCH: The patient's mood and manner are appropriate.   ED Results / Procedures / Treatments   LABS: (all labs ordered are listed, but only abnormal results are displayed) Labs Reviewed  CBC WITH  DIFFERENTIAL/PLATELET - Abnormal; Notable for the following components:      Result Value   WBC 15.6 (*)    Hemoglobin 11.2 (*)    HCT 35.4 (*)    Neutro Abs 11.7 (*)    Eosinophils Absolute 0.8 (*)    All other components within normal limits  BASIC METABOLIC PANEL WITH GFR - Abnormal; Notable for the following components:   Glucose, Bld 109 (*)    All other components within normal limits  RESP PANEL BY RT-PCR (RSV, FLU A&B, COVID)  RVPGX2  HCG, QUANTITATIVE, PREGNANCY  TROPONIN I (HIGH SENSITIVITY)     EKG:  EKG Interpretation Date/Time:  Tuesday August 11 2024 01:59:28 EDT Ventricular Rate:  117 PR Interval:  136 QRS Duration:  90 QT Interval:  323 QTC Calculation: 451 R Axis:   64  Text Interpretation: Sinus tachycardia Borderline repolarization abnormality Baseline wander in lead(s) V2 Confirmed by Neomi Neptune [phone]) on 08/11/2024 2:32:35 AM         RADIOLOGY: My personal review and interpretation of imaging: Chest x-ray, CTA chest clear.  I have personally reviewed all radiology reports.   CT Angio Chest PE W and/or Wo Contrast Result Date: 08/11/2024 CLINICAL DATA:  Pulmonary embolism (PE) suspected, high prob. Cough, shortness of breath. EXAM: CT ANGIOGRAPHY CHEST WITH CONTRAST TECHNIQUE: Multidetector CT imaging of the chest was performed using the standard protocol during bolus administration of intravenous contrast. Multiplanar CT image reconstructions and MIPs were obtained to evaluate the vascular anatomy. RADIATION DOSE REDUCTION: This exam was performed according to the departmental dose-optimization program which includes automated exposure control, adjustment of the mA and/or kV according to patient size and/or use of iterative reconstruction technique. CONTRAST:  OMNIPAQUE  IOHEXOL  350 MG/ML SOLN COMPARISON:  Chest x-ray today. FINDINGS: Cardiovascular: No filling defects in the pulmonary arteries to suggest pulmonary emboli. Heart is normal size.  Aorta is normal caliber. Mediastinum/Nodes: No mediastinal, hilar, or axillary adenopathy. Trachea and esophagus are unremarkable. Thyroid unremarkable. Lungs/Pleura: Lungs are clear. No focal airspace opacities or suspicious nodules. No effusions. Upper Abdomen: No acute findings Musculoskeletal: Chest wall soft tissues are unremarkable. No acute bony abnormality. Review of the MIP images confirms the above findings. IMPRESSION: No evidence of pulmonary embolus. No acute cardiopulmonary disease. Electronically Signed   By: Franky Crease M.D.   On: 08/11/2024 03:25   DG Chest Portable 1 View Result Date: 08/11/2024 CLINICAL DATA:  Shortness of breath and cough EXAM: PORTABLE CHEST 1 VIEW COMPARISON:  10/08/2023 FINDINGS: The heart size and mediastinal contours are within normal limits. Both lungs are clear. The visualized skeletal structures are unremarkable. IMPRESSION: No active  disease. Electronically Signed   By: Norman Gatlin M.D.   On: 08/11/2024 00:25     PROCEDURES:  Critical Care performed: Yes, see critical care procedure note(s)   CRITICAL CARE Performed by: Josette Keanu Lesniak   Total critical care time: 30 minutes  Critical care time was exclusive of separately billable procedures and treating other patients.  Critical care was necessary to treat or prevent imminent or life-threatening deterioration.  Critical care was time spent personally by me on the following activities: development of treatment plan with patient and/or surrogate as well as nursing, discussions with consultants, evaluation of patient's response to treatment, examination of patient, obtaining history from patient or surrogate, ordering and performing treatments and interventions, ordering and review of laboratory studies, ordering and review of radiographic studies, pulse oximetry and re-evaluation of patient's condition.   SABRA1-3 Lead EKG Interpretation  Performed by: Journei Thomassen, Josette SAILOR, DO Authorized by: Lakely Elmendorf,  Josette SAILOR, DO     Interpretation: abnormal     ECG rate:  125   ECG rate assessment: tachycardic     Rhythm: sinus tachycardia     Ectopy: none     Conduction: normal       IMPRESSION / MDM / ASSESSMENT AND PLAN / ED COURSE  I reviewed the triage vital signs and the nursing notes.    Patient here with shortness of breath, subjective fevers, cough.  History of asthma.  Mild occasional wheezing on exam.  The patient is on the cardiac monitor to evaluate for evidence of arrhythmia and/or significant heart rate changes.   DIFFERENTIAL DIAGNOSIS (includes but not limited to):   Asthma, viral URI, pneumonia, pneumothorax, anxiety, PE, less likely ACS   Patient's presentation is most consistent with acute presentation with potential threat to life or bodily function.   PLAN: Will obtain chest x-ray, COVID, flu and RSV swab.  Will continue breathing treatments, steroids.  Will give cough suppressants.   MEDICATIONS GIVEN IN ED: Medications  albuterol  (PROVENTIL ) (2.5 MG/3ML) 0.083% nebulizer solution 5 mg (5 mg Nebulization Given 08/11/24 0028)  ipratropium (ATROVENT ) nebulizer solution 0.5 mg (0.5 mg Nebulization Given 08/11/24 0028)  methylPREDNISolone  sodium succinate (SOLU-MEDROL ) 125 mg/2 mL injection 125 mg (125 mg Intramuscular Given 08/11/24 0030)  chlorpheniramine-HYDROcodone (TUSSIONEX) 10-8 MG/5ML suspension 5 mL (5 mLs Oral Given 08/11/24 0028)  ipratropium-albuterol  (DUONEB) 0.5-2.5 (3) MG/3ML nebulizer solution 3 mL (3 mLs Nebulization Given 08/11/24 0102)  ondansetron  (ZOFRAN -ODT) disintegrating tablet 4 mg (4 mg Oral Given 08/11/24 0104)  dextromethorphan  (DELSYM ) 30 MG/5ML liquid 30 mg (30 mg Oral Given 08/11/24 0127)  sodium chloride  0.9 % bolus 1,000 mL (0 mLs Intravenous Stopped 08/11/24 0354)  iohexol  (OMNIPAQUE ) 350 MG/ML injection 100 mL (100 mLs Intravenous Contrast Given 08/11/24 0309)  ipratropium-albuterol  (DUONEB) 0.5-2.5 (3) MG/3ML nebulizer solution 3 mL (3 mLs  Nebulization Given 08/11/24 0353)     ED COURSE: COVID, flu and RSV negative.  Chest x-ray reviewed and interpreted by myself and the radiologist and is unremarkable.  Patient continues to feel very symptomatic despite breathing treatments, steroids, cough medication, antiemetics.  She was now diaphoretic and more tachycardic.  Likely from albuterol  but will recheck temp, obtain labs, CTA of the chest to rule out PE or other infectious process.  Patient may need admission to the hospital.  Will give IV fluids.   4:00 AM  Labs show leukocytosis of 15,000.  Negative troponin.  Negative pregnancy test.  CTA read viewed and interpreted by myself and the radiologist and shows no pneumonia,  PE.  On reevaluation, patient is now having inspiratory and expiratory wheezing although tachycardia and tachypnea have improved.  I have offered admission for asthma exacerbation.  Will continue breathing treatments.  Patient comfortable with this plan.   CONSULTS:  Consulted and discussed patient's case with hospitalist, Dr. Lawence.  I have recommended admission and consulting physician agrees and will place admission orders.  Patient (and family if present) agree with this plan.   I reviewed all nursing notes, vitals, pertinent previous records.  All labs, EKGs, imaging ordered have been independently reviewed and interpreted by myself.    OUTSIDE RECORDS REVIEWED: Reviewed last PCP note on 05/27/2024.       FINAL CLINICAL IMPRESSION(S) / ED DIAGNOSES   Final diagnoses:  Exacerbation of intermittent asthma, unspecified asthma severity     Rx / DC Orders   ED Discharge Orders     None        Note:  This document was prepared using Dragon voice recognition software and may include unintentional dictation errors.   Reagan Klemz, Josette SAILOR, DO 08/11/24 [phone]

## 2024-08-12 DIAGNOSIS — J4521 Mild intermittent asthma with (acute) exacerbation: Principal | ICD-10-CM

## 2024-08-12 LAB — BASIC METABOLIC PANEL WITH GFR
Anion gap: 9 (ref 5–15)
BUN: 15 mg/dL (ref 6–20)
CO2: 24 mmol/L (ref 22–32)
Calcium: 9.3 mg/dL (ref 8.9–10.3)
Chloride: 106 mmol/L (ref 98–111)
Creatinine, Ser: 0.74 mg/dL (ref 0.44–1.00)
GFR, Estimated: >60 mL/min (ref >60)
Glucose, Bld: 108 mg/dL — ABNORMAL HIGH (ref 70–99)
Potassium: 3.8 mmol/L (ref 3.5–5.1)
Sodium: 139 mmol/L (ref 135–145)

## 2024-08-12 LAB — GLUCOSE, CAPILLARY
Glucose-Capillary: 97 mg/dL (ref 70–99)
Glucose-Capillary: 126 mg/dL — ABNORMAL HIGH (ref 70–99)

## 2024-08-12 LAB — CBC
HCT: 35.6 % — ABNORMAL LOW (ref 36.0–46.0)
Hemoglobin: 11.7 g/dL — ABNORMAL LOW (ref 12.0–15.0)
MCH: 28.3 pg (ref 26.0–34.0)
MCHC: 32.9 g/dL (ref 30.0–36.0)
MCV: 86.2 fL (ref 80.0–100.0)
Platelets: 374 K/uL (ref 150–400)
RBC: 4.13 MIL/uL (ref 3.87–5.11)
RDW: 14.6 % (ref 11.5–15.5)
WBC: 22.1 K/uL — ABNORMAL HIGH (ref 4.0–10.5)
nRBC: 0 % (ref 0.0–0.2)

## 2024-08-12 LAB — PROTIME-INR
INR: 1 (ref 0.8–1.2)
Prothrombin Time: 13.3 s (ref 11.4–15.2)

## 2024-08-12 LAB — MAGNESIUM: Magnesium: 2.2 mg/dL (ref 1.7–2.4)

## 2024-08-12 MED ORDER — PREDNISONE 10 MG PO TABS: ORAL_TABLET | ORAL | 0 refills | Status: AC

## 2024-08-12 MED ORDER — HYDROCOD POLI-CHLORPHE POLI ER 10-8 MG/5ML PO SUER: 5.0000 mL | Freq: Two times a day (BID) | ORAL | 0 refills | Status: AC | PRN

## 2024-08-12 NOTE — Discharge Summary (Addendum)
 Physician Discharge Summary  Dominique Sanders FMW:[id number] DOB: 05-Oct-1996 DOA: 08/10/2024  PCP: Pcp, No  Admit date: 08/10/2024 Discharge date: 08/12/2024  Admitted From: home  Disposition:  home   Recommendations for Outpatient Follow-up:  Follow up with PCP in 1-2 weeks   Home Health: no  Equipment/Devices:  Discharge Condition: stable  CODE STATUS: full  Diet recommendation:  Carb Modified  Brief/Interim Summary: HPI was taken from Dr. Sonjia: Patient is a 28 years old female with history of obesity, type 2 diabetes, asthma presented to hospital with cough shortness of breath, wheezing and subjective fever and fatigue. Patient also complained of chest discomfort.  Despite breathing treatment by EMS, patient did not have relief so presented to the ED. patient denies any dizziness lightheadedness or syncope.  Denies any nausea vomiting or abdominal pain.  Denies any urinary urgency, frequency or dysuria.   In the ED patient was tachycardic, tachypneic.  Afebrile.  Initial labs were notable for WBC at 15.6 with some eosinophilia.  EKG showed sinus tachycardia.  Chest x-ray without infiltrate.  CT angiogram of the chest was performed and there was no pulmonary embolism or acute infiltrate.  COVID influenza and RSV was negative.  Patient was then considered for admission to the hospital for further evaluation and treatment.   Discharge Diagnoses:  Principal Problem:   Acute asthma exacerbation Active Problems:   BMI 50.0-59.9, adult (HCC)  Acute asthma exacerbation: mild, intermittent. CTA of the chest and chest x-ray negative for infiltrate PE. Continue on steroids, bronchodilators & encourage incentive spirometry. Not requiring supplemental oxygen and talking in complete sentences   Morbid obesity: BMI 53.8. Complicates overall care & prognosis    DM2: HbA1c 5.2, well controlled  Discharge Instructions  Discharge Instructions     Diet Carb Modified   Complete by: As directed     Discharge instructions   Complete by: As directed    F/u w/ PCP in 1-2 weeks.   Increase activity slowly   Complete by: As directed       Allergies as of 08/12/2024   No Known Allergies      Medication List     TAKE these medications    chlorpheniramine-HYDROcodone 10-8 MG/5ML Commonly known as: TUSSIONEX Take 5 mLs by mouth every 12 (twelve) hours as needed for up to 10 days for cough.   fluticasone  50 MCG/ACT nasal spray Commonly known as: FLONASE  Place 2 sprays into both nostrils 2 (two) times daily.   levocetirizine 5 MG tablet Commonly known as: XYZAL  Take 5 mg by mouth daily.   montelukast  10 MG tablet Commonly known as: SINGULAIR  Take 10 mg by mouth daily.   NYQUIL PO Take 30 mLs by mouth every 6 (six) hours.   predniSONE  10 MG tablet Commonly known as: DELTASONE  40mg  daily x 2 days, 30mg  daily x 2 days, 20mg  daily x 2 days, 10mg  daily x 2 days then stop   sertraline  100 MG tablet Commonly known as: ZOLOFT  Take 100 mg by mouth at bedtime.   Ventolin  HFA 108 (90 Base) MCG/ACT inhaler Generic drug: albuterol  Inhale 2 puffs into the lungs every 4 (four) hours as needed for wheezing or shortness of breath.   Vitamin D  (Ergocalciferol ) 1.25 MG (50000 UNIT) Caps capsule Commonly known as: DRISDOL  Take 50,000 Units by mouth once a week.   Wegovy 0.5 MG/0.5ML Soaj SQ injection Generic drug: semaglutide-weight management Inject 0.5 mg into the skin once a week.        No Known Allergies  Consultations:    Procedures/Studies: CT Angio Chest PE W and/or Wo Contrast Result Date: 08/11/2024 CLINICAL DATA:  Pulmonary embolism (PE) suspected, high prob. Cough, shortness of breath. EXAM: CT ANGIOGRAPHY CHEST WITH CONTRAST TECHNIQUE: Multidetector CT imaging of the chest was performed using the standard protocol during bolus administration of intravenous contrast. Multiplanar CT image reconstructions and MIPs were obtained to evaluate the vascular anatomy.  RADIATION DOSE REDUCTION: This exam was performed according to the departmental dose-optimization program which includes automated exposure control, adjustment of the mA and/or kV according to patient size and/or use of iterative reconstruction technique. CONTRAST:  OMNIPAQUE  IOHEXOL  350 MG/ML SOLN COMPARISON:  Chest x-ray today. FINDINGS: Cardiovascular: No filling defects in the pulmonary arteries to suggest pulmonary emboli. Heart is normal size. Aorta is normal caliber. Mediastinum/Nodes: No mediastinal, hilar, or axillary adenopathy. Trachea and esophagus are unremarkable. Thyroid unremarkable. Lungs/Pleura: Lungs are clear. No focal airspace opacities or suspicious nodules. No effusions. Upper Abdomen: No acute findings Musculoskeletal: Chest wall soft tissues are unremarkable. No acute bony abnormality. Review of the MIP images confirms the above findings. IMPRESSION: No evidence of pulmonary embolus. No acute cardiopulmonary disease. Electronically Signed   By: Franky Crease M.D.   On: 08/11/2024 03:25   DG Chest Portable 1 View Result Date: 08/11/2024 CLINICAL DATA:  Shortness of breath and cough EXAM: PORTABLE CHEST 1 VIEW COMPARISON:  10/08/2023 FINDINGS: The heart size and mediastinal contours are within normal limits. Both lungs are clear. The visualized skeletal structures are unremarkable. IMPRESSION: No active disease. Electronically Signed   By: Norman Gatlin M.D.   On: 08/11/2024 00:25   (Echo, Carotid, EGD, Colonoscopy, ERCP)    Subjective: Pt c/o cough   Discharge Exam: Vitals:   08/12/24 0725 08/12/24 0813  BP:  126/85  Pulse:  71  Resp:  18  Temp:  99 F (37.2 C)  SpO2: 98% 98%   Vitals:   08/12/24 0428 08/12/24 0500 08/12/24 0725 08/12/24 0813  BP: (!) 144/94   126/85  Pulse: 79   71  Resp: 16   18  Temp: 98.1 F (36.7 C)   99 F (37.2 C)  TempSrc: Oral   Oral  SpO2: 97%  98% 98%  Weight:  133.5 kg    Height:        General: Pt is alert, awake, not in  acute distress Cardiovascular: S1/S2 +, no rubs, no gallops Respiratory: decreased breath sounds b/l  Abdominal: Soft, NT,obese, bowel sounds + Extremities: no edema, no cyanosis    The results of significant diagnostics from this hospitalization (including imaging, microbiology, ancillary and laboratory) are listed below for reference.     Microbiology: Recent Results (from the past 240 hours)  Resp panel by RT-PCR (RSV, Flu A&B, Covid) Anterior Nasal Swab     Status: None   Collection Time: 08/11/24 12:06 AM   Specimen: Anterior Nasal Swab  Result Value Ref Range Status   SARS Coronavirus 2 by RT PCR NEGATIVE NEGATIVE Final    Comment: (NOTE) SARS-CoV-2 target nucleic acids are NOT DETECTED.  The SARS-CoV-2 RNA is generally detectable in upper respiratory specimens during the acute phase of infection. The lowest concentration of SARS-CoV-2 viral copies this assay can detect is 138 copies/mL. A negative result does not preclude SARS-Cov-2 infection and should not be used as the sole basis for treatment or other patient management decisions. A negative result may occur with  improper specimen collection/handling, submission of specimen other than nasopharyngeal swab, presence of viral  mutation(s) within the areas targeted by this assay, and inadequate number of viral copies(<138 copies/mL). A negative result must be combined with clinical observations, patient history, and epidemiological information. The expected result is Negative.  Fact Sheet for Patients:  BloggerCourse.com  Fact Sheet for Healthcare Providers:  SeriousBroker.it  This test is no t yet approved or cleared by the [place]  FDA and  has been authorized for detection and/or diagnosis of SARS-CoV-2 by FDA under an Emergency Use Authorization (EUA). This EUA will remain  in effect (meaning this test can be used) for the duration of the COVID-19 declaration  under Section 564(b)(1) of the Act, 21 U.S.C.section 360bbb-3(b)(1), unless the authorization is terminated  or revoked sooner.       Influenza A by PCR NEGATIVE NEGATIVE Final   Influenza B by PCR NEGATIVE NEGATIVE Final    Comment: (NOTE) The Xpert Xpress SARS-CoV-2/FLU/RSV plus assay is intended as an aid in the diagnosis of influenza from Nasopharyngeal swab specimens and should not be used as a sole basis for treatment. Nasal washings and aspirates are unacceptable for Xpert Xpress SARS-CoV-2/FLU/RSV testing.  Fact Sheet for Patients: BloggerCourse.com  Fact Sheet for Healthcare Providers: SeriousBroker.it  This test is not yet approved or cleared by the [place]  FDA and has been authorized for detection and/or diagnosis of SARS-CoV-2 by FDA under an Emergency Use Authorization (EUA). This EUA will remain in effect (meaning this test can be used) for the duration of the COVID-19 declaration under Section 564(b)(1) of the Act, 21 U.S.C. section 360bbb-3(b)(1), unless the authorization is terminated or revoked.     Resp Syncytial Virus by PCR NEGATIVE NEGATIVE Final    Comment: (NOTE) Fact Sheet for Patients: BloggerCourse.com  Fact Sheet for Healthcare Providers: SeriousBroker.it  This test is not yet approved or cleared by the [place]  FDA and has been authorized for detection and/or diagnosis of SARS-CoV-2 by FDA under an Emergency Use Authorization (EUA). This EUA will remain in effect (meaning this test can be used) for the duration of the COVID-19 declaration under Section 564(b)(1) of the Act, 21 U.S.C. section 360bbb-3(b)(1), unless the authorization is terminated or revoked.  Performed at [facility], [address] [place], [place] [address]      Labs: BNP (last 3 results) Recent Labs    10/08/23 1937  BNP 26.4   Basic Metabolic  Panel: Recent Labs  Lab 08/11/24 0154 08/12/24 0604  NA 137 139  K 3.6 3.8  CL 100 106  CO2 27 24  GLUCOSE 109* 108*  BUN 13 15  CREATININE 0.78 0.74  CALCIUM  8.9 9.3  MG  --  2.2   Liver Function Tests: No results for input(s): AST, ALT, ALKPHOS, BILITOT, PROT, ALBUMIN in the last 168 hours. No results for input(s): LIPASE, AMYLASE in the last 168 hours. No results for input(s): AMMONIA  in the last 168 hours. CBC: Recent Labs  Lab 08/11/24 0154 08/12/24 0604  WBC 15.6* 22.1*  NEUTROABS 11.7*  --   HGB 11.2* 11.7*  HCT 35.4* 35.6*  MCV 87.4 86.2  PLT 341 374   Cardiac Enzymes: No results for input(s): CKTOTAL, CKMB, CKMBINDEX, TROPONINI in the last 168 hours. BNP: Invalid input(s): POCBNP CBG: Recent Labs  Lab 08/11/24 1203 08/11/24 1536 08/11/24 2001 08/12/24 0812 08/12/24 1145  GLUCAP 167* 164* 214* 97 126*   D-Dimer No results for input(s): DDIMER in the last 72 hours. Hgb A1c Recent Labs    08/11/24 0911  HGBA1C 5.2  Lipid Profile No results for input(s): CHOL, HDL, LDLCALC, TRIG, CHOLHDL, LDLDIRECT in the last 72 hours. Thyroid function studies No results for input(s): TSH, T4TOTAL, T3FREE, THYROIDAB in the last 72 hours.  Invalid input(s): FREET3 Anemia work up No results for input(s): VITAMINB12, FOLATE, FERRITIN, TIBC, IRON, RETICCTPCT in the last 72 hours. Urinalysis    Component Value Date/Time   COLORURINE YELLOW (A) 02/26/2023 1123   APPEARANCEUR HAZY (A) 02/26/2023 1123   APPEARANCEUR Cloudy (A) 08/13/2022 1530   LABSPEC 1.015 02/26/2023 1123   PHURINE 6.0 02/26/2023 1123   GLUCOSEU Trace (A) 03/12/2023 1452   GLUCOSEU NEGATIVE 02/26/2023 1123   HGBUR LARGE (A) 02/26/2023 1123   BILIRUBINUR Negative 03/12/2023 1452   BILIRUBINUR Negative 08/13/2022 1530   KETONESUR 20 (A) 02/26/2023 1123   PROTEINUR 30 (A) 02/26/2023 1123   UROBILINOGEN 0.2 03/12/2023 1452    NITRITE Negative 03/12/2023 1452   NITRITE NEGATIVE 02/26/2023 1123   LEUKOCYTESUR Moderate (2+) (A) 03/12/2023 1452   LEUKOCYTESUR LARGE (A) 02/26/2023 1123   Sepsis Labs Recent Labs  Lab 08/11/24 0154 08/12/24 0604  WBC 15.6* 22.1*   Microbiology Recent Results (from the past 240 hours)  Resp panel by RT-PCR (RSV, Flu A&B, Covid) Anterior Nasal Swab     Status: None   Collection Time: 08/11/24 12:06 AM   Specimen: Anterior Nasal Swab  Result Value Ref Range Status   SARS Coronavirus 2 by RT PCR NEGATIVE NEGATIVE Final    Comment: (NOTE) SARS-CoV-2 target nucleic acids are NOT DETECTED.  The SARS-CoV-2 RNA is generally detectable in upper respiratory specimens during the acute phase of infection. The lowest concentration of SARS-CoV-2 viral copies this assay can detect is 138 copies/mL. A negative result does not preclude SARS-Cov-2 infection and should not be used as the sole basis for treatment or other patient management decisions. A negative result may occur with  improper specimen collection/handling, submission of specimen other than nasopharyngeal swab, presence of viral mutation(s) within the areas targeted by this assay, and inadequate number of viral copies(<138 copies/mL). A negative result must be combined with clinical observations, patient history, and epidemiological information. The expected result is Negative.  Fact Sheet for Patients:  BloggerCourse.com  Fact Sheet for Healthcare Providers:  SeriousBroker.it  This test is no t yet approved or cleared by the [place]  FDA and  has been authorized for detection and/or diagnosis of SARS-CoV-2 by FDA under an Emergency Use Authorization (EUA). This EUA will remain  in effect (meaning this test can be used) for the duration of the COVID-19 declaration under Section 564(b)(1) of the Act, 21 U.S.C.section 360bbb-3(b)(1), unless the authorization is  terminated  or revoked sooner.       Influenza A by PCR NEGATIVE NEGATIVE Final   Influenza B by PCR NEGATIVE NEGATIVE Final    Comment: (NOTE) The Xpert Xpress SARS-CoV-2/FLU/RSV plus assay is intended as an aid in the diagnosis of influenza from Nasopharyngeal swab specimens and should not be used as a sole basis for treatment. Nasal washings and aspirates are unacceptable for Xpert Xpress SARS-CoV-2/FLU/RSV testing.  Fact Sheet for Patients: BloggerCourse.com  Fact Sheet for Healthcare Providers: SeriousBroker.it  This test is not yet approved or cleared by the [place]  FDA and has been authorized for detection and/or diagnosis of SARS-CoV-2 by FDA under an Emergency Use Authorization (EUA). This EUA will remain in effect (meaning this test can be used) for the duration of the COVID-19 declaration under Section 564(b)(1) of the Act, 21 U.S.C. section  360bbb-3(b)(1), unless the authorization is terminated or revoked.     Resp Syncytial Virus by PCR NEGATIVE NEGATIVE Final    Comment: (NOTE) Fact Sheet for Patients: BloggerCourse.com  Fact Sheet for Healthcare Providers: SeriousBroker.it  This test is not yet approved or cleared by the [place]  FDA and has been authorized for detection and/or diagnosis of SARS-CoV-2 by FDA under an Emergency Use Authorization (EUA). This EUA will remain in effect (meaning this test can be used) for the duration of the COVID-19 declaration under Section 564(b)(1) of the Act, 21 U.S.C. section 360bbb-3(b)(1), unless the authorization is terminated or revoked.  Performed at [facility], [address] [place], [place] [address]      Time coordinating discharge: 33 minutes  SIGNED:   Anthony CHRISTELLA Pouch, MD  [facility] 08/12/2024, 1:18 PM Pager   If 7PM-7AM, please contact  night-coverage [email or web address]

## 2024-08-12 NOTE — Plan of Care (Signed)

## 2024-09-15 ENCOUNTER — Ambulatory Visit: Payer: MEDICAID | Admitting: Licensed Practical Nurse

## 2024-11-03 ENCOUNTER — Telehealth: Payer: Self-pay

## 2024-11-03 ENCOUNTER — Encounter: Payer: Self-pay | Admitting: Advanced Practice Midwife

## 2024-11-03 NOTE — Telephone Encounter (Signed)
 Patient called requesting a referral for breast reduction. Informed patient that she would need to be seen in the office, as she has not had a visit within the past year. Patient stated she has apt elsewhere. Advised that our office can send medical records if needed. Patient verbalized understanding.

## 2024-11-07 ENCOUNTER — Other Ambulatory Visit: Payer: Self-pay | Admitting: Advanced Practice Midwife

## 2024-11-07 DIAGNOSIS — N62 Hypertrophy of breast: Secondary | ICD-10-CM

## 2024-11-07 NOTE — Progress Notes (Signed)
 Referral sent to [facility]. Patient desires breast reduction due to pain from heavy/large breasts.

## 2024-11-10 ENCOUNTER — Institutional Professional Consult (permissible substitution): Payer: MEDICAID | Admitting: Plastic Surgery

## 2024-11-17 ENCOUNTER — Institutional Professional Consult (permissible substitution): Payer: MEDICAID | Admitting: Plastic Surgery
